# Patient Record
Sex: Female | Born: 1960 | Race: White | Hispanic: No | Smoking: Former smoker
Health system: Southern US, Community
[De-identification: ages and names within clinical notes are randomized; demographics above are authoritative.]

## PROBLEM LIST (undated history)

## (undated) DIAGNOSIS — C50919 Malignant neoplasm of unspecified site of unspecified female breast: Secondary | ICD-10-CM

## (undated) DIAGNOSIS — Z1501 Genetic susceptibility to malignant neoplasm of breast: Secondary | ICD-10-CM

## (undated) DIAGNOSIS — F32A Depression, unspecified: Secondary | ICD-10-CM

## (undated) DIAGNOSIS — Z9221 Personal history of antineoplastic chemotherapy: Secondary | ICD-10-CM

## (undated) DIAGNOSIS — C50511 Malignant neoplasm of lower-outer quadrant of right female breast: Secondary | ICD-10-CM

## (undated) DIAGNOSIS — I890 Lymphedema, not elsewhere classified: Secondary | ICD-10-CM

## (undated) DIAGNOSIS — Z1509 Genetic susceptibility to other malignant neoplasm: Secondary | ICD-10-CM

## (undated) DIAGNOSIS — F329 Major depressive disorder, single episode, unspecified: Secondary | ICD-10-CM

## (undated) DIAGNOSIS — R51 Headache: Secondary | ICD-10-CM

## (undated) DIAGNOSIS — Z5320 Procedure and treatment not carried out because of patient's decision for unspecified reasons: Secondary | ICD-10-CM

## (undated) DIAGNOSIS — Z923 Personal history of irradiation: Secondary | ICD-10-CM

## (undated) DIAGNOSIS — R519 Headache, unspecified: Secondary | ICD-10-CM

## (undated) HISTORY — DX: Genetic susceptibility to other malignant neoplasm: Z15.09

## (undated) HISTORY — DX: Malignant neoplasm of lower-outer quadrant of right female breast: C50.511

## (undated) HISTORY — DX: Genetic susceptibility to malignant neoplasm of breast: Z15.01

## (undated) HISTORY — DX: Procedure and treatment not carried out because of patient's decision for unspecified reasons: Z53.20

---

## 1987-06-09 HISTORY — PX: ECTOPIC PREGNANCY SURGERY: SHX613

## 1992-06-08 DIAGNOSIS — Z923 Personal history of irradiation: Secondary | ICD-10-CM

## 1992-06-08 DIAGNOSIS — C50919 Malignant neoplasm of unspecified site of unspecified female breast: Secondary | ICD-10-CM

## 1992-06-08 DIAGNOSIS — Z9221 Personal history of antineoplastic chemotherapy: Secondary | ICD-10-CM

## 1992-06-08 HISTORY — DX: Malignant neoplasm of unspecified site of unspecified female breast: C50.919

## 1992-06-08 HISTORY — DX: Personal history of antineoplastic chemotherapy: Z92.21

## 1992-06-08 HISTORY — PX: BREAST LUMPECTOMY: SHX2

## 1992-06-08 HISTORY — DX: Personal history of irradiation: Z92.3

## 2015-03-05 ENCOUNTER — Other Ambulatory Visit: Payer: Self-pay | Admitting: Family Medicine

## 2015-03-05 ENCOUNTER — Other Ambulatory Visit: Payer: Self-pay

## 2015-03-06 LAB — CMP12+LP+TP+TSH+6AC+CBC/D/PLT
ALBUMIN: 4.6 g/dL (ref 3.5–5.5)
ALK PHOS: 100 IU/L (ref 39–117)
ALT: 29 IU/L (ref 0–32)
AST: 28 IU/L (ref 0–40)
Albumin/Globulin Ratio: 1.8 (ref 1.1–2.5)
BASOS: 2 %
BILIRUBIN TOTAL: 0.5 mg/dL (ref 0.0–1.2)
BUN / CREAT RATIO: 19 (ref 9–23)
BUN: 13 mg/dL (ref 6–24)
Basophils Absolute: 0.1 10*3/uL (ref 0.0–0.2)
CALCIUM: 9.9 mg/dL (ref 8.7–10.2)
CHLORIDE: 102 mmol/L (ref 97–108)
CREATININE: 0.67 mg/dL (ref 0.57–1.00)
Chol/HDL Ratio: 2.7 ratio units (ref 0.0–4.4)
Cholesterol, Total: 207 mg/dL — ABNORMAL HIGH (ref 100–199)
EOS (ABSOLUTE): 0.4 10*3/uL (ref 0.0–0.4)
EOS: 9 %
Free Thyroxine Index: 2.5 (ref 1.2–4.9)
GFR calc Af Amer: 114 mL/min/{1.73_m2} (ref >59)
GFR, EST NON AFRICAN AMERICAN: 99 mL/min/{1.73_m2} (ref >59)
GGT: 25 IU/L (ref 0–60)
GLUCOSE: 88 mg/dL (ref 65–99)
Globulin, Total: 2.5 g/dL (ref 1.5–4.5)
HDL: 77 mg/dL (ref >39)
HEMATOCRIT: 43.4 % (ref 34.0–46.6)
HEMOGLOBIN: 14.6 g/dL (ref 11.1–15.9)
IMMATURE GRANULOCYTES: 0 %
IRON: 88 ug/dL (ref 27–159)
Immature Grans (Abs): 0 10*3/uL (ref 0.0–0.1)
LDH: 189 IU/L (ref 119–226)
LDL CALC: 116 mg/dL — AB (ref 0–99)
LYMPHS ABS: 1.4 10*3/uL (ref 0.7–3.1)
Lymphs: 34 %
MCH: 30.2 pg (ref 26.6–33.0)
MCHC: 33.6 g/dL (ref 31.5–35.7)
MCV: 90 fL (ref 79–97)
Monocytes Absolute: 0.3 10*3/uL (ref 0.1–0.9)
Monocytes: 9 %
NEUTROS ABS: 1.9 10*3/uL (ref 1.4–7.0)
Neutrophils: 46 %
POTASSIUM: 4.8 mmol/L (ref 3.5–5.2)
Phosphorus: 2.8 mg/dL (ref 2.5–4.5)
Platelets: 236 10*3/uL (ref 150–379)
RBC: 4.84 x10E6/uL (ref 3.77–5.28)
RDW: 13.4 % (ref 12.3–15.4)
SODIUM: 140 mmol/L (ref 134–144)
T3 Uptake Ratio: 30 % (ref 24–39)
T4, Total: 8.3 ug/dL (ref 4.5–12.0)
TOTAL PROTEIN: 7.1 g/dL (ref 6.0–8.5)
TSH: 2.05 u[IU]/mL (ref 0.450–4.500)
Triglycerides: 69 mg/dL (ref 0–149)
URIC ACID: 6 mg/dL (ref 2.5–7.1)
VLDL CHOLESTEROL CAL: 14 mg/dL (ref 5–40)
WBC: 4 10*3/uL (ref 3.4–10.8)

## 2015-03-06 LAB — HGB A1C W/O EAG: HEMOGLOBIN A1C: 6 % — AB (ref 4.8–5.6)

## 2015-06-24 DIAGNOSIS — G43009 Migraine without aura, not intractable, without status migrainosus: Secondary | ICD-10-CM | POA: Insufficient documentation

## 2015-06-24 DIAGNOSIS — Z853 Personal history of malignant neoplasm of breast: Secondary | ICD-10-CM | POA: Insufficient documentation

## 2015-06-25 ENCOUNTER — Other Ambulatory Visit: Payer: Self-pay | Admitting: Internal Medicine

## 2015-06-25 DIAGNOSIS — Z853 Personal history of malignant neoplasm of breast: Secondary | ICD-10-CM

## 2015-07-05 ENCOUNTER — Other Ambulatory Visit: Payer: Self-pay | Admitting: Internal Medicine

## 2015-07-05 DIAGNOSIS — Z1231 Encounter for screening mammogram for malignant neoplasm of breast: Secondary | ICD-10-CM

## 2015-07-10 ENCOUNTER — Other Ambulatory Visit: Payer: Self-pay | Admitting: *Deleted

## 2015-07-10 ENCOUNTER — Inpatient Hospital Stay
Admission: RE | Admit: 2015-07-10 | Discharge: 2015-07-10 | Disposition: A | Discharge status: Home / Self Care | Payer: Self-pay | Source: Ambulatory Visit | Attending: *Deleted | Admitting: *Deleted

## 2015-07-10 DIAGNOSIS — Z9289 Personal history of other medical treatment: Secondary | ICD-10-CM

## 2015-07-11 ENCOUNTER — Ambulatory Visit: Payer: Self-pay

## 2015-07-17 ENCOUNTER — Ambulatory Visit
Admission: RE | Admit: 2015-07-17 | Discharge: 2015-07-17 | Disposition: A | Discharge status: Home / Self Care | Payer: No Typology Code available for payment source | Source: Ambulatory Visit | Attending: Internal Medicine | Admitting: Internal Medicine

## 2015-07-17 DIAGNOSIS — Z1231 Encounter for screening mammogram for malignant neoplasm of breast: Secondary | ICD-10-CM | POA: Diagnosis present

## 2015-07-17 HISTORY — DX: Malignant neoplasm of unspecified site of unspecified female breast: C50.919

## 2015-07-22 ENCOUNTER — Other Ambulatory Visit: Payer: Self-pay | Admitting: Internal Medicine

## 2015-07-22 DIAGNOSIS — R928 Other abnormal and inconclusive findings on diagnostic imaging of breast: Secondary | ICD-10-CM

## 2015-07-24 ENCOUNTER — Ambulatory Visit
Admission: RE | Admit: 2015-07-24 | Discharge: 2015-07-24 | Disposition: A | Discharge status: Home / Self Care | Payer: No Typology Code available for payment source | Source: Ambulatory Visit | Attending: Internal Medicine | Admitting: Internal Medicine

## 2015-07-24 DIAGNOSIS — R928 Other abnormal and inconclusive findings on diagnostic imaging of breast: Secondary | ICD-10-CM

## 2015-07-24 DIAGNOSIS — N63 Unspecified lump in breast: Secondary | ICD-10-CM | POA: Insufficient documentation

## 2015-07-24 DIAGNOSIS — N6489 Other specified disorders of breast: Secondary | ICD-10-CM | POA: Diagnosis present

## 2015-07-25 ENCOUNTER — Other Ambulatory Visit: Payer: Self-pay | Admitting: Internal Medicine

## 2015-07-25 DIAGNOSIS — N63 Unspecified lump in unspecified breast: Secondary | ICD-10-CM

## 2015-08-06 ENCOUNTER — Ambulatory Visit
Admission: RE | Admit: 2015-08-06 | Discharge: 2015-08-06 | Disposition: A | Discharge status: Home / Self Care | Payer: No Typology Code available for payment source | Source: Ambulatory Visit | Attending: Internal Medicine | Admitting: Internal Medicine

## 2015-08-06 DIAGNOSIS — C50512 Malignant neoplasm of lower-outer quadrant of left female breast: Secondary | ICD-10-CM | POA: Insufficient documentation

## 2015-08-06 DIAGNOSIS — N63 Unspecified lump in unspecified breast: Secondary | ICD-10-CM

## 2015-08-09 ENCOUNTER — Ambulatory Visit: Payer: Self-pay | Admitting: General Surgery

## 2015-08-09 LAB — SURGICAL PATHOLOGY

## 2015-08-12 ENCOUNTER — Ambulatory Visit (INDEPENDENT_AMBULATORY_CARE_PROVIDER_SITE_OTHER): Payer: PRIVATE HEALTH INSURANCE | Admitting: General Surgery

## 2015-08-12 ENCOUNTER — Encounter: Payer: Self-pay | Admitting: General Surgery

## 2015-08-12 ENCOUNTER — Other Ambulatory Visit: Payer: Self-pay

## 2015-08-12 VITALS — BP 130/80 | HR 82 | Resp 12 | Ht 65.0 in | Wt 156.0 lb

## 2015-08-12 DIAGNOSIS — Z853 Personal history of malignant neoplasm of breast: Secondary | ICD-10-CM | POA: Diagnosis not present

## 2015-08-12 DIAGNOSIS — N6310 Unspecified lump in the right breast, unspecified quadrant: Secondary | ICD-10-CM

## 2015-08-12 DIAGNOSIS — N63 Unspecified lump in breast: Secondary | ICD-10-CM

## 2015-08-12 DIAGNOSIS — C50511 Malignant neoplasm of lower-outer quadrant of right female breast: Secondary | ICD-10-CM

## 2015-08-12 HISTORY — PX: BREAST BIOPSY: SHX20

## 2015-08-12 NOTE — Progress Notes (Signed)
Patient ID: Amy Holland, female   DOB: 1960/08/16, 55 y.o.   MRN: [id number]  Chief Complaint  Patient presents with  . Follow-up    mammogram, right breast biopsy    HPI Amy Holland is a 56 y.o. female who presents for a breast evaluation. The most recent mammogram was done on 08-06-15 with U/S and biopsy of the right breast.  Biopsy was positive. No fever,pain or discharge. The patient declined BRCA testing in the past. She has 3 children, 2 males in one female.  The patient had transient menopause after chemotherapy with her first cancer in 1994.Marland Kitchen Resume shortly thereafter and ended age 1. (2002). Patient does perform regular self breast checks and gets regular mammograms done.   I personally reviewed the patient's history.    HPI  Past Medical History  Diagnosis Date  . Breast cancer ([place]) 1994    left lumpectomy with lymph node removal c Radiation and chemo    Past Surgical History  Procedure Laterality Date  . Breast lumpectomy Left 1994    with lymph node removal  . Ectopic pregnancy surgery  1989    Family History  Problem Relation Age of Onset  . Breast cancer Sister 39    BRCA positive.  . Breast cancer Sister 80    BRCA negative  . Ovarian cancer Maternal Grandmother     Social History Social History  Substance Use Topics  . Smoking status: Never Smoker   . Smokeless tobacco: None  . Alcohol Use: 0.0 oz/week    0 Standard drinks or equivalent per week     Comment: 2 drinks per day    No Known Allergies  Current Outpatient Prescriptions  Medication Sig Dispense Refill  . Loratadine 10 MG CAPS Take by mouth.    . SUMAtriptan (IMITREX) 50 MG tablet Take 50 mg by mouth every 2 (two) hours as needed for migraine. May repeat in 2 hours if headache persists or recurs.     No current facility-administered medications for this visit.    Review of Systems Review of Systems  Constitutional: Negative.   Respiratory: Negative.   Cardiovascular: Negative.      Blood pressure 130/80, pulse 82, resp. rate 12, height _0  (1.651 m), weight 156 lb (70.761 kg).  Physical Exam Physical Exam  Constitutional: She is oriented to person, place, and time. She appears well-developed and well-nourished.  Eyes: Conjunctivae and EOM are normal. No scleral icterus.  Neck: Neck supple.  Cardiovascular: Normal rate, regular rhythm and normal heart sounds.   Pulmonary/Chest: Effort normal and breath sounds normal. Right breast exhibits no inverted nipple, no mass, no nipple discharge, no skin change and no tenderness. Left breast exhibits no inverted nipple, no skin change and no tenderness.    Left breast deformed  10 o'clock and dimpling at the incision site.   Lymphadenopathy:    She has no cervical adenopathy.  Neurological: She is alert and oriented to person, place, and time.  Skin: Skin is warm and dry.  Nursing note and vitals reviewed.   Data Reviewed Ultrasound guided core biopsy showed evidence of invasive mammary carcinoma no special type, 0.5 cm. Histologic grade 1.  ER 90%, PR 10-50%, HER-2/neu not overexpressed.  2017 mammograms and ultrasound reviewed.  Ultrasound examination of the right breast is completed to determine a preoperative needle localization will be required. At the 7:00 position, 6 cm from the nipple a taller rather than wide mass measuring 0.6 by's 1.0 x 1.06 cm is  identified corresponding to the previously defined lesion. BI-RADS-6.  Assessment    Stage I carcinoma of the right breast.    Plan    Options for management were reviewed. Changes since 1994 including sentinel node biopsy were discussed. Equivalency of breast conservation and mastectomy were reviewed. Possibility of mastectomy with immediate reconstruction discussed. Possibility of contour correction for the contralateral breast with plastic surgery was reviewed.  The patient reports that her considerations is the quickest ability to return to work. She  is supporting her self and the potential for rapid recovery is most important in her mind at this time.  Opportunity for plastic surgery referral reviewed.  Opportunity for second surgical opinion discussed (the patient's sisters were cared for at [facility] and were generally is massive of her going there) sees.  Informational brochure in websites provided.  The patient will contact the office if she has any questions, desires referral would like to schedule surgery.     Scribed by Sebastian Ache  PCP:  Cephas Darby, Forest Gleason 08/13/2015, 9:06 PM

## 2015-08-13 ENCOUNTER — Encounter: Payer: Self-pay | Admitting: General Surgery

## 2015-08-13 DIAGNOSIS — C50511 Malignant neoplasm of lower-outer quadrant of right female breast: Secondary | ICD-10-CM | POA: Insufficient documentation

## 2015-08-29 ENCOUNTER — Other Ambulatory Visit: Payer: Self-pay | Admitting: *Deleted

## 2015-08-29 ENCOUNTER — Telehealth: Payer: Self-pay | Admitting: *Deleted

## 2015-08-29 DIAGNOSIS — C50511 Malignant neoplasm of lower-outer quadrant of right female breast: Secondary | ICD-10-CM

## 2015-08-29 NOTE — Telephone Encounter (Signed)
Message for patient to call the office.   We need to review time for patient to be at the hospital day of surgery and review instructions. Patient will only require a pre-op visit if she has additional questions.   Paperwork will be mailed to the patient once we have spoken with her on the phone.

## 2015-08-29 NOTE — Telephone Encounter (Signed)
Patient called the office to report that she has decided to have a right breast lumpectomy.   This patient would like to get this scheduled for 09-24-15.

## 2015-08-30 ENCOUNTER — Encounter: Payer: Self-pay | Admitting: *Deleted

## 2015-08-30 NOTE — Progress Notes (Signed)
  Oncology Nurse Navigator Documentation  Navigator Location: [facility] (08/30/15 1500) Navigator Encounter Type: Introductory phone call (08/30/15 1500)   Abnormal Finding Date: 07/24/15 (08/30/15 1500) Confirmed Diagnosis Date: 09/03/15 (08/30/15 1500) Surgery Date: 09/24/15 (08/30/15 1500)                                  Time Spent with Patient: 15 (08/30/15 1500)   Left message for patient to return my call.

## 2015-09-02 NOTE — Telephone Encounter (Signed)
Patient called the office back and was notified of surgery times.   Instructions were reviewed by phone with the patient.   Paperwork will be mailed to the patient today.   She was instructed to call the office should she have further questions.   *Patient states Dr. Bary Castilla told her she would not be a candidate for Tamoxifen therapy. She would like to know what she would be a candidate for. Otherwise, patient has no further questions at this time.

## 2015-09-02 NOTE — Telephone Encounter (Signed)
The patient is a candidate for estrogen suppression, typically done with an aromatase inhibitor at her age and menopausal status.  Unlikely require mastectomy based on tumor size and location, would plan for wide excision, sentinel node and postoperative radiation.  Timing of radiation would depend on whether she is going to receive partial breast were whole breast radiation.  She'll call if she has any additional questions.

## 2015-09-10 ENCOUNTER — Encounter: Payer: Self-pay | Admitting: *Deleted

## 2015-09-10 NOTE — Progress Notes (Signed)
  Oncology Nurse Navigator Documentation  Navigator Location: [facility] (09/10/15 0900) Navigator Encounter Type: Introductory phone call (09/10/15 0900)                                          Time Spent with Patient: 15 (09/10/15 0900)   Left message with patient to return my call.  I would like to establish navigation services.

## 2015-09-11 ENCOUNTER — Other Ambulatory Visit: Payer: Self-pay | Admitting: General Surgery

## 2015-09-17 ENCOUNTER — Inpatient Hospital Stay
Admission: RE | Admit: 2015-09-17 | Discharge: 2015-09-17 | Disposition: A | Discharge status: Home / Self Care | Payer: Self-pay | Source: Ambulatory Visit

## 2015-09-17 ENCOUNTER — Encounter: Payer: Self-pay | Admitting: *Deleted

## 2015-09-17 NOTE — Patient Instructions (Signed)
  Your procedure is scheduled on:09/24/15 Report to Radiology.at 7:15 .  Remember: Instructions that are not followed completely may result in serious medical risk, up to and including death, or upon the discretion of your surgeon and anesthesiologist your surgery may need to be rescheduled.    _x___ 1. Do not eat food or drink liquids after midnight. No gum chewing or hard candies.     _x___ 2. No Alcohol for 24 hours before or after surgery.   ____ 3. Bring all medications with you on the day of surgery if instructed.    _x__ 4. Notify your doctor if there is any change in your medical condition     (cold, fever, infections).     Do not wear jewelry, make-up, hairpins, clips or nail polish.  Do not wear lotions, powders, or perfumes. You may wear deodorant.  Do not shave 48 hours prior to surgery. Men may shave face and neck.  Do not bring valuables to the hospital.    [facility] is not responsible for any belongings or valuables.               Contacts, dentures or bridgework may not be worn into surgery.  Leave your suitcase in the car. After surgery it may be brought to your room.  For patients admitted to the hospital, discharge time is determined by your                treatment team.   Patients discharged the day of surgery will not be allowed to drive home.   Please read over the following fact sheets that you were given:   Care and Recovery After Surgery discussed  __x__ Take these medicines the morning of surgery with A SIP OF WATER:    1. Loratidine  2. flonase  3.   4.  5.  6.  ____ Fleet Enema (as directed)   _x___ Use CHG Soap as directed if able to come to get it.  ____ Use inhalers on the day of surgery  ____ Stop metformin 2 days prior to surgery    ____ Take 1/2 of usual insulin dose the night before surgery and none on the morning of surgery.   ____ Stop Coumadin/Plavix/aspirin on   __x__ Stop Anti-inflammatories on may take tylenol only   ____  Stop supplements until after surgery.  May continue multivitamin  ____ Bring C-Pap to the hospital.

## 2015-09-17 NOTE — Pre-Procedure Instructions (Signed)
Office notified of need for updated H&P. Out of date.

## 2015-09-23 NOTE — Pre-Procedure Instructions (Signed)
Dr. Bary Castilla gave permission to use the left arm for IV stick.

## 2015-09-24 ENCOUNTER — Encounter: Payer: Self-pay | Admitting: *Deleted

## 2015-09-24 ENCOUNTER — Ambulatory Visit: Payer: No Typology Code available for payment source | Admitting: Anesthesiology

## 2015-09-24 ENCOUNTER — Ambulatory Visit
Admission: RE | Admit: 2015-09-24 | Discharge: 2015-09-24 | Disposition: A | Discharge status: Home / Self Care | Payer: No Typology Code available for payment source | Source: Ambulatory Visit | Attending: General Surgery | Admitting: General Surgery

## 2015-09-24 ENCOUNTER — Encounter
Admission: RE | Disposition: A | Discharge status: Home / Self Care | Payer: Self-pay | Source: Ambulatory Visit | Attending: General Surgery

## 2015-09-24 DIAGNOSIS — F329 Major depressive disorder, single episode, unspecified: Secondary | ICD-10-CM | POA: Diagnosis not present

## 2015-09-24 DIAGNOSIS — C50511 Malignant neoplasm of lower-outer quadrant of right female breast: Secondary | ICD-10-CM

## 2015-09-24 DIAGNOSIS — Z9889 Other specified postprocedural states: Secondary | ICD-10-CM | POA: Diagnosis not present

## 2015-09-24 DIAGNOSIS — Z87891 Personal history of nicotine dependence: Secondary | ICD-10-CM | POA: Insufficient documentation

## 2015-09-24 DIAGNOSIS — Z853 Personal history of malignant neoplasm of breast: Secondary | ICD-10-CM | POA: Diagnosis not present

## 2015-09-24 DIAGNOSIS — Z79899 Other long term (current) drug therapy: Secondary | ICD-10-CM | POA: Diagnosis not present

## 2015-09-24 DIAGNOSIS — Z91040 Latex allergy status: Secondary | ICD-10-CM | POA: Diagnosis not present

## 2015-09-24 DIAGNOSIS — Z7951 Long term (current) use of inhaled steroids: Secondary | ICD-10-CM | POA: Insufficient documentation

## 2015-09-24 DIAGNOSIS — C50911 Malignant neoplasm of unspecified site of right female breast: Secondary | ICD-10-CM

## 2015-09-24 HISTORY — DX: Headache: R51

## 2015-09-24 HISTORY — DX: Malignant neoplasm of lower-outer quadrant of right female breast: C50.511

## 2015-09-24 HISTORY — PX: BREAST LUMPECTOMY: SHX2

## 2015-09-24 HISTORY — DX: Depression, unspecified: F32.A

## 2015-09-24 HISTORY — PX: PARTIAL MASTECTOMY WITH AXILLARY SENTINEL LYMPH NODE BIOPSY: SHX6004

## 2015-09-24 HISTORY — DX: Headache, unspecified: R51.9

## 2015-09-24 HISTORY — DX: Major depressive disorder, single episode, unspecified: F32.9

## 2015-09-24 SURGERY — PARTIAL MASTECTOMY WITH AXILLARY SENTINEL LYMPH NODE BIOPSY
Anesthesia: General | Laterality: Right | Wound class: Clean

## 2015-09-24 MED ORDER — HYDROCODONE-ACETAMINOPHEN 5-325 MG PO TABS: 1.0000 | ORAL_TABLET | ORAL | Status: DC | PRN

## 2015-09-24 MED ORDER — MIDAZOLAM HCL 2 MG/2ML IJ SOLN
INTRAMUSCULAR | Status: DC | PRN
  Administered 2015-09-24: 2 mg via INTRAVENOUS

## 2015-09-24 MED ORDER — FAMOTIDINE 20 MG PO TABS
ORAL_TABLET | ORAL | Status: AC
  Filled 2015-09-24: qty 1

## 2015-09-24 MED ORDER — KETOROLAC TROMETHAMINE 30 MG/ML IJ SOLN
INTRAMUSCULAR | Status: DC | PRN
  Administered 2015-09-24: 30 mg via INTRAVENOUS

## 2015-09-24 MED ORDER — ONDANSETRON HCL 4 MG/2ML IJ SOLN
INTRAMUSCULAR | Status: DC | PRN
  Administered 2015-09-24: 4 mg via INTRAVENOUS

## 2015-09-24 MED ORDER — METHYLENE BLUE 0.5 % INJ SOLN
INTRAVENOUS | Status: AC
  Filled 2015-09-24: qty 10

## 2015-09-24 MED ORDER — FENTANYL CITRATE (PF) 100 MCG/2ML IJ SOLN
25.0000 ug | INTRAMUSCULAR | Status: DC | PRN
  Administered 2015-09-24 (×2): 25 ug via INTRAVENOUS

## 2015-09-24 MED ORDER — TECHNETIUM TC 99M SULFUR COLLOID
1.0100 | Freq: Once | INTRAVENOUS | Status: AC | PRN
  Administered 2015-09-24: 1.01 via INTRAVENOUS

## 2015-09-24 MED ORDER — BUPIVACAINE-EPINEPHRINE (PF) 0.5% -1:200000 IJ SOLN
INTRAMUSCULAR | Status: AC
  Filled 2015-09-24: qty 30

## 2015-09-24 MED ORDER — FENTANYL CITRATE (PF) 100 MCG/2ML IJ SOLN
INTRAMUSCULAR | Status: AC
  Administered 2015-09-24: 25 ug via INTRAVENOUS
  Filled 2015-09-24: qty 2

## 2015-09-24 MED ORDER — FAMOTIDINE 20 MG PO TABS
20.0000 mg | ORAL_TABLET | Freq: Once | ORAL | Status: AC
  Administered 2015-09-24: 20 mg via ORAL

## 2015-09-24 MED ORDER — BUPIVACAINE-EPINEPHRINE (PF) 0.5% -1:200000 IJ SOLN
INTRAMUSCULAR | Status: DC | PRN
  Administered 2015-09-24: 30 mL

## 2015-09-24 MED ORDER — ACETAMINOPHEN 10 MG/ML IV SOLN
INTRAVENOUS | Status: DC | PRN
  Administered 2015-09-24: 1000 mg via INTRAVENOUS

## 2015-09-24 MED ORDER — ONDANSETRON HCL 4 MG/2ML IJ SOLN: 4.0000 mg | Freq: Once | INTRAMUSCULAR | Status: DC | PRN

## 2015-09-24 MED ORDER — DEXAMETHASONE SODIUM PHOSPHATE 10 MG/ML IJ SOLN
INTRAMUSCULAR | Status: DC | PRN
  Administered 2015-09-24: 10 mg via INTRAVENOUS

## 2015-09-24 MED ORDER — FENTANYL CITRATE (PF) 100 MCG/2ML IJ SOLN
INTRAMUSCULAR | Status: DC | PRN
  Administered 2015-09-24 (×2): 50 ug via INTRAVENOUS

## 2015-09-24 MED ORDER — ACETAMINOPHEN 10 MG/ML IV SOLN
INTRAVENOUS | Status: AC
  Filled 2015-09-24: qty 100

## 2015-09-24 MED ORDER — PROPOFOL 10 MG/ML IV BOLUS
INTRAVENOUS | Status: DC | PRN
Start: 2015-09-24 — End: 2015-09-24
  Administered 2015-09-24: 200 mg via INTRAVENOUS

## 2015-09-24 MED ORDER — METHYLENE BLUE 0.5 % INJ SOLN
INTRAVENOUS | Status: DC | PRN
  Administered 2015-09-24: 6 mL

## 2015-09-24 MED ORDER — LACTATED RINGERS IV SOLN
INTRAVENOUS | Status: DC
  Administered 2015-09-24: 09:00:00 via INTRAVENOUS

## 2015-09-24 MED ORDER — SODIUM CHLORIDE 0.9 % IJ SOLN
INTRAMUSCULAR | Status: AC
  Filled 2015-09-24: qty 10

## 2015-09-24 SURGICAL SUPPLY — 54 items
APPLIER CLIP 11 MED OPEN (CLIP)
APPLIER CLIP 13 LRG OPEN (CLIP)
BANDAGE ELASTIC 6 LF NS (GAUZE/BANDAGES/DRESSINGS) ×3 IMPLANT
BLADE SURG 15 STRL SS SAFETY (BLADE) ×3 IMPLANT
BNDG GAUZE 4.5X4.1 6PLY STRL (MISCELLANEOUS) ×3 IMPLANT
BULB RESERV EVAC DRAIN JP 100C (MISCELLANEOUS) IMPLANT
CANISTER SUCT 1200ML W/VALVE (MISCELLANEOUS) ×3 IMPLANT
CHLORAPREP W/TINT 26ML (MISCELLANEOUS) ×3 IMPLANT
CLIP APPLIE 11 MED OPEN (CLIP) IMPLANT
CLIP APPLIE 13 LRG OPEN (CLIP) IMPLANT
CLOSURE WOUND 1/2 X4 (GAUZE/BANDAGES/DRESSINGS) ×1
CNTNR SPEC 2.5X3XGRAD LEK (MISCELLANEOUS)
CONT SPEC 4OZ STER OR WHT (MISCELLANEOUS)
CONTAINER SPEC 2.5X3XGRAD LEK (MISCELLANEOUS) IMPLANT
DEVICE DUBIN SPECIMEN MAMMOGRA (MISCELLANEOUS) ×3 IMPLANT
DRAIN CHANNEL JP 15F RND 16 (MISCELLANEOUS) IMPLANT
DRAPE LAPAROTOMY TRNSV 106X77 (MISCELLANEOUS) ×3 IMPLANT
DRSG TELFA 3X8 NADH (GAUZE/BANDAGES/DRESSINGS) ×3 IMPLANT
ELECT CAUTERY BLADE TIP 2.5 (TIP) ×3
ELECT REM PT RETURN 9FT ADLT (ELECTROSURGICAL) ×3
ELECTRODE CAUTERY BLDE TIP 2.5 (TIP) ×1 IMPLANT
ELECTRODE REM PT RTRN 9FT ADLT (ELECTROSURGICAL) ×1 IMPLANT
GAUZE FLUFF 18X24 1PLY STRL (GAUZE/BANDAGES/DRESSINGS) ×3 IMPLANT
GAUZE SPONGE 4X4 12PLY STRL (GAUZE/BANDAGES/DRESSINGS) IMPLANT
GLOVE BIO SURGEON STRL SZ7.5 (GLOVE) IMPLANT
GLOVE INDICATOR 8.0 STRL GRN (GLOVE) IMPLANT
GLOVE SKINSENSE NS SZ7.0 (GLOVE) ×8
GLOVE SKINSENSE STRL SZ7.0 (GLOVE) ×4 IMPLANT
GOWN STRL REUS W/ TWL LRG LVL3 (GOWN DISPOSABLE) ×2 IMPLANT
GOWN STRL REUS W/TWL LRG LVL3 (GOWN DISPOSABLE) ×4
LABEL OR SOLS (LABEL) ×3 IMPLANT
NEEDLE HYPO 25X1 1.5 SAFETY (NEEDLE) ×3 IMPLANT
PACK BASIN MINOR ARMC (MISCELLANEOUS) ×3 IMPLANT
PIN SAFETY STRL (MISCELLANEOUS) IMPLANT
SHEARS FOC LG CVD HARMONIC 17C (MISCELLANEOUS) IMPLANT
SLEVE PROBE SENORX GAMMA FIND (MISCELLANEOUS) ×3 IMPLANT
SPONGE LAP 18X18 5 PK (GAUZE/BANDAGES/DRESSINGS) ×3 IMPLANT
STRIP CLOSURE SKIN 1/2X4 (GAUZE/BANDAGES/DRESSINGS) ×2 IMPLANT
SUT ETHILON 3-0 FS-10 30 BLK (SUTURE) ×3
SUT SILK 0 (SUTURE)
SUT SILK 0 30XBRD TIE 6 (SUTURE) IMPLANT
SUT SILK 3 0 (SUTURE)
SUT SILK 3-0 18XBRD TIE 12 (SUTURE) IMPLANT
SUT VIC AB 2-0 CT1 27 (SUTURE) ×4
SUT VIC AB 2-0 CT1 TAPERPNT 27 (SUTURE) ×2 IMPLANT
SUT VIC AB 2-0 CT2 27 (SUTURE) IMPLANT
SUT VIC AB 3-0 SH 27 (SUTURE) ×2
SUT VIC AB 3-0 SH 27X BRD (SUTURE) ×1 IMPLANT
SUT VIC AB 4-0 FS2 27 (SUTURE) ×3 IMPLANT
SUT VICRYL+ 3-0 144IN (SUTURE) ×3 IMPLANT
SUTURE EHLN 3-0 FS-10 30 BLK (SUTURE) ×1 IMPLANT
SWABSTK COMLB BENZOIN TINCTURE (MISCELLANEOUS) ×3 IMPLANT
SYR CONTROL 10ML (SYRINGE) ×3 IMPLANT
TAPE TRANSPORE STRL 2 31045 (GAUZE/BANDAGES/DRESSINGS) IMPLANT

## 2015-09-24 NOTE — Discharge Instructions (Signed)

## 2015-09-24 NOTE — Transfer of Care (Signed)
Immediate Anesthesia Transfer of Care Note  Patient: Amy Holland  Procedure(s) Performed: Procedure(s): PARTIAL MASTECTOMY WITH AXILLARY SENTINEL LYMPH NODE BIOPSY, WIDE EXCISION, RECONSTRUCTION/MASTOPLASTY (Right)  Patient Location: PACU  Anesthesia Type:General  Level of Consciousness: awake, alert  and oriented  Airway & Oxygen Therapy: Patient Spontanous Breathing and Patient connected to face mask oxygen  Post-op Assessment: Report given to RN and Post -op Vital signs reviewed and stable  Post vital signs: Reviewed and stable  Last Vitals:  Filed Vitals:   09/24/15 0833 09/24/15 1039  BP: 136/71 134/81  Pulse: 71 89  Temp:  36.2 C  Resp: 16 14    Complications: No apparent anesthesia complications

## 2015-09-24 NOTE — Anesthesia Procedure Notes (Signed)
Procedure Name: LMA Insertion Date/Time: 09/24/2015 9:30 AM Performed by: Jonna Clark Pre-anesthesia Checklist: Patient identified, Patient being monitored, Timeout performed, Emergency Drugs available and Suction available Patient Re-evaluated:Patient Re-evaluated prior to inductionOxygen Delivery Method: Circle system utilized Preoxygenation: Pre-oxygenation with 100% oxygen Intubation Type: IV induction Ventilation: Mask ventilation without difficulty LMA: LMA inserted LMA Size: 3.5 Tube type: Oral Number of attempts: 1 Placement Confirmation: positive ETCO2 and breath sounds checked- equal and bilateral Tube secured with: Tape Dental Injury: Teeth and Oropharynx as per pre-operative assessment

## 2015-09-24 NOTE — Anesthesia Preprocedure Evaluation (Signed)
Anesthesia Evaluation  Patient identified by MRN, date of birth, ID band Patient awake    Reviewed: Allergy & Precautions, H&P , NPO status , Patient's Chart, lab work & pertinent test results, reviewed documented beta blocker date and time   History of Anesthesia Complications Negative for: history of anesthetic complications  Airway Mallampati: II  TM Distance: >3 FB Neck ROM: full    Dental no notable dental hx. (+) Caps, Implants, Chipped   Pulmonary neg pulmonary ROS, former smoker,    Pulmonary exam normal breath sounds clear to auscultation       Cardiovascular Exercise Tolerance: Good negative cardio ROS Normal cardiovascular exam Rhythm:regular Rate:Normal     Neuro/Psych  Headaches, neg Seizures PSYCHIATRIC DISORDERS (Depression)    GI/Hepatic negative GI ROS, Neg liver ROS,   Endo/Other  negative endocrine ROS  Renal/GU negative Renal ROS  negative genitourinary   Musculoskeletal   Abdominal   Peds  Hematology negative hematology ROS (+)   Anesthesia Other Findings Past Medical History:   Breast cancer ([place])                             1994           Comment:left lumpectomy with lymph node removal c               Radiation and chemo   Depression                                                   Headache                                                       Comment:migraine   Reproductive/Obstetrics negative OB ROS                             Anesthesia Physical Anesthesia Plan  ASA: II  Anesthesia Plan: General   Post-op Pain Management:    Induction:   Airway Management Planned:   Additional Equipment:   Intra-op Plan:   Post-operative Plan:   Informed Consent: I have reviewed the patients History and Physical, chart, labs and discussed the procedure including the risks, benefits and alternatives for the proposed anesthesia with the patient or authorized  representative who has indicated his/her understanding and acceptance.   Dental Advisory Given  Plan Discussed with: Anesthesiologist, CRNA and Surgeon  Anesthesia Plan Comments:         Anesthesia Quick Evaluation

## 2015-09-24 NOTE — H&P (Signed)
Cynara Bibbins UX:[id number] April 12, 1961     HPI: No change in clinical history.  Prescriptions prior to admission  Medication Sig Dispense Refill Last Dose  . fluticasone (FLONASE) 50 MCG/ACT nasal spray Place 1 spray into both nostrils daily.     . Multiple Vitamin (MULTIVITAMIN) tablet Take 1 tablet by mouth daily.     . Loratadine 10 MG CAPS Take by mouth.     . SUMAtriptan (IMITREX) 50 MG tablet Take 50 mg by mouth every 2 (two) hours as needed for migraine. May repeat in 2 hours if headache persists or recurs.   Taking   Allergies  Allergen Reactions  . Latex Rash   Past Medical History  Diagnosis Date  . Breast cancer ([place]) 1994    left lumpectomy with lymph node removal c Radiation and chemo  . Depression   . Headache     migraine   Past Surgical History  Procedure Laterality Date  . Breast lumpectomy Left 1994    with lymph node removal  . Ectopic pregnancy surgery  1989   Social History   Social History  . Marital Status: Divorced    Spouse Name: N/A  . Number of Children: N/A  . Years of Education: N/A   Occupational History  . Not on file.   Social History Main Topics  . Smoking status: Former Smoker    Quit date: 06/19/1983  . Smokeless tobacco: Never Used  . Alcohol Use: 0.0 oz/week    0 Standard drinks or equivalent per week     Comment: 2 drinks per day  . Drug Use: No  . Sexual Activity: No   Other Topics Concern  . Not on file   Social History Narrative   Social History   Social History Narrative     ROS: Negative.     PE: HEENT: Negative. Lungs: Clear. Cardio: RR.  Data:  Previously completed right breast ultrasound showed a mass at the 7:00 position, 6 and initially the nipple. Previous biopsy showed invasive mammary carcinoma.    Assessment Stage I carcinoma the breast.  Plan:    Proceed with planned Right breast wide excision and sentinel node biopsy.  Robert Bellow 09/24/2015

## 2015-09-24 NOTE — Anesthesia Postprocedure Evaluation (Signed)
Anesthesia Post Note  Patient: Amy Holland  Procedure(s) Performed: Procedure(s) (LRB): PARTIAL MASTECTOMY WITH AXILLARY SENTINEL LYMPH NODE BIOPSY, WIDE EXCISION, RECONSTRUCTION/MASTOPLASTY (Right)  Patient location during evaluation: PACU Anesthesia Type: General Level of consciousness: awake and alert Pain management: pain level controlled Vital Signs Assessment: post-procedure vital signs reviewed and stable Respiratory status: spontaneous breathing, nonlabored ventilation, respiratory function stable and patient connected to nasal cannula oxygen Cardiovascular status: blood pressure returned to baseline and stable Postop Assessment: no signs of nausea or vomiting Anesthetic complications: no    Last Vitals:  Filed Vitals:   09/24/15 1143 09/24/15 1209  BP: 134/84 124/84  Pulse: 75   Temp: 36.4 C   Resp: 16 104    Last Pain:  Filed Vitals:   09/24/15 1223  PainSc: 0-No pain                 Martha Clan

## 2015-09-24 NOTE — Op Note (Signed)
Preoperative diagnosis: Right breast cancer.  Postoperative diagnosis: Same.  Operative procedure: Wide local excision, mastoplasty, sentinel lymph node biopsy. Of the right breast.  Operating surgeon: Ollen Bowl, M.D.  Anesthesia: Gen. by LMA, Marcaine 0.5% with 1-200,000 of epinephrine, 30 mL local infiltration.  Estimated blood loss: Less than 5 mL.  Clinical note: This 55 year old woman was recently identified with a small but cancer in the 7:00 position of the left breast. She desired breast conservation. She was injected with technetium sulfur colloid prior to the procedure.  Operative note: With the patient under adequate general anesthesia a total of 4 mL of methylene blue diluted with saline 1-2 was injected in subareolar plexus. The breast, chest and neck: was then prepped with ChloraPrep and draped. Ultrasound was used to identify the lesion in the lower outer quadrant of left breast. It was fairly close to the skin and for that reason. Skin was taken. The incision ran from just below the edge of the areola to just above the inframammary fold. The skin was incised sharply and the remaining dissection completed with electrocautery. The specimen was orientated and specimen radiograph confirmed the previously placed clip in the center of the biopsy specimen. Pathology reported clear margins. While pathology was pending attention was turned to the axilla. The gamma finder was used to identify the area of increased uptake at the base the right axilla. Local anesthetic was infiltrated as had been done prior to the primary breast incision for postoperative analgesia. A small transverse incision was made and carried aphthous consulting this tissue with hemostasis achieved by electrocautery as well as 3-0 Vicryl ties. 25 mm hot, blue lymph nodes were identified next to each other. These were removed and sent in formalin for routine histology. Scanning through the remainder of the axilla showed  no additional areas of increased radioactive uptake.  The axilla was closed in layers with interrupted 2-0 Vicryl figure-of-eight sutures. The skin was closed with a running 4-0 Vicryl septic suture.  The breast wide excision site was closed after mobilizing the breast off the underlying pectoralis fascia. As was done circumferentially for approximately 5 cm and then approximated with interrupted 2-0 Vicryl figure-of-eight sutures. Multiple layers were used to approximate the superficial breast tissue. The skin was elevated as 5 mm flap circumferentially to allow smooth apposition with a running 4-0 Vicryl septic suture. Benzoin, Steri-Strips, Telfa, fluffed gauze, Kerlix and an Ace wrap was applied.  The patient tolerated the procedure well and was taken to recovery in stable condition.

## 2015-09-26 ENCOUNTER — Telehealth: Payer: Self-pay | Admitting: *Deleted

## 2015-09-26 ENCOUNTER — Telehealth: Payer: Self-pay | Admitting: General Surgery

## 2015-09-26 LAB — SURGICAL PATHOLOGY

## 2015-09-26 NOTE — Telephone Encounter (Signed)
-----   Message from Robert Bellow, MD sent at 09/26/2015  2:42 PM EDT ----- Please request lab to send for Mammoprint/ Blueprint testing. Thanks.   ----- Message -----    From: Lab in [facility] Interface    Sent: 09/26/2015  10:28 AM      To: Robert Bellow, MD

## 2015-09-26 NOTE — Telephone Encounter (Signed)
[facility] pathology aware of testing.

## 2015-09-26 NOTE — Telephone Encounter (Signed)
x

## 2015-09-27 ENCOUNTER — Ambulatory Visit (INDEPENDENT_AMBULATORY_CARE_PROVIDER_SITE_OTHER): Payer: PRIVATE HEALTH INSURANCE | Admitting: *Deleted

## 2015-09-27 DIAGNOSIS — C50511 Malignant neoplasm of lower-outer quadrant of right female breast: Secondary | ICD-10-CM

## 2015-09-27 NOTE — Progress Notes (Addendum)
Patient came in today for a wound check right breast lumpectomy .  The wound is clean, with no signs of infection noted. Follow up as scheduled.   The patient was informed of the pathology results, T1c, N0 tumor.  Minimal volume loss. Importance of wearing her bra day and night emphasized.  We will send the samples for Mammoprint testing prior to referral to medical oncology.  Ollen Bowl, M.D.

## 2015-09-30 ENCOUNTER — Ambulatory Visit: Payer: PRIVATE HEALTH INSURANCE | Admitting: General Surgery

## 2015-10-08 ENCOUNTER — Encounter: Payer: Self-pay | Admitting: General Surgery

## 2015-10-08 ENCOUNTER — Other Ambulatory Visit (INDEPENDENT_AMBULATORY_CARE_PROVIDER_SITE_OTHER): Payer: PRIVATE HEALTH INSURANCE

## 2015-10-08 ENCOUNTER — Ambulatory Visit: Payer: PRIVATE HEALTH INSURANCE | Admitting: General Surgery

## 2015-10-08 VITALS — BP 128/76 | HR 88 | Resp 12 | Ht 64.0 in | Wt 155.0 lb

## 2015-10-08 DIAGNOSIS — C50511 Malignant neoplasm of lower-outer quadrant of right female breast: Secondary | ICD-10-CM

## 2015-10-08 NOTE — Progress Notes (Signed)
Patient ID: Amy Holland, female   DOB: 1961-05-18, 55 y.o.   MRN: [id number]  Chief Complaint  Patient presents with  . Follow-up    Right Breast lumpectomy    HPI Amy Holland is a 55 y.o. female.  Here today for her postoperative visit, right breast partial mastectomy on 09-24-15. She states she is doing well. Minimal to no pain. She is still wearing her bra for support.  The patient study progress and increasing discomfort at the surgical site.  Mammoprint testing is still pending.  I personally reviewed the patient's history.   HPI  Past Medical History  Diagnosis Date  . Depression   . Headache     migraine  . Breast cancer ([place]) 1994    left lumpectomy with lymph node removal c Radiation and chemo  . Breast cancer of lower-outer quadrant of right female breast ([place]) 09/24/2015    Right breast, 7 o'clock:11 mm, histologic grade 2, T1c, N0; ER/PR positive, HER-2/neu not overexpressed. Negative margins    Past Surgical History  Procedure Laterality Date  . Ectopic pregnancy surgery  1989  . Partial mastectomy with axillary sentinel lymph node biopsy Right 09/24/2015    Procedure: PARTIAL MASTECTOMY WITH AXILLARY SENTINEL LYMPH NODE BIOPSY, WIDE EXCISION, RECONSTRUCTION/MASTOPLASTY;  Surgeon: Robert Bellow, MD;  Location: [facility] ORS;  Service: General;  Laterality: Right;  . Breast lumpectomy Left 1994    with lymph node removal Dr Amalia Hailey in [place]     Family History  Problem Relation Age of Onset  . Breast cancer Sister 7    BRCA positive.  . Breast cancer Sister 75    BRCA negative  . Ovarian cancer Mother   . Breast cancer Sister     BRCA positive    Social History Social History  Substance Use Topics  . Smoking status: Former Smoker    Quit date: 06/19/1983  . Smokeless tobacco: Never Used  . Alcohol Use: 0.0 oz/week    0 Standard drinks or equivalent per week     Comment: 2 drinks per day    Allergies  Allergen Reactions  . Latex Rash     Current Outpatient Prescriptions  Medication Sig Dispense Refill  . fluticasone (FLONASE) 50 MCG/ACT nasal spray Place 1 spray into both nostrils daily.    . Loratadine 10 MG CAPS Take by mouth.    . Multiple Vitamin (MULTIVITAMIN) tablet Take 1 tablet by mouth daily.    . SUMAtriptan (IMITREX) 50 MG tablet Take 50 mg by mouth every 2 (two) hours as needed for migraine. May repeat in 2 hours if headache persists or recurs.     No current facility-administered medications for this visit.    Review of Systems Review of Systems  Constitutional: Negative.   Respiratory: Negative.   Cardiovascular: Negative.     Blood pressure 128/76, pulse 88, resp. rate 12, height _0  (1.626 m), weight 155 lb (70.308 kg).  Physical Exam Physical Exam  Constitutional: She is oriented to person, place, and time. She appears well-developed and well-nourished.  Pulmonary/Chest:    Incision well healed right breast with minimal bruising.  Neurological: She is alert and oriented to person, place, and time.  Skin: Skin is warm and dry.  Psychiatric: Her behavior is normal.    Data Reviewed 11 mm, histologic grade 2, T1c, N0; ER/PR positive, HER-2/neu not overexpressed. Negative margins.  Ultrasound examination of the right breast at the wide excision site was completed to determine if she was a candidate  for accelerated partial breast radiation. The small cavity is within 6 mm of the skin. She is not a candidate for accelerated partial breast radiation.    Assessment    Doing well status post wide excision, mastoplasty and sentinel node biopsy. Stage I cancer.      Plan    The patient is anxious to move ahead with radiation assessment. Medical oncology assessment pending Mammoprint results.    Awaiting mammoprint results. Appointment with Radiation Oncologist.  Discussed genetic testing.  PCP:  Frazier Richards This information has been scribed by Karie Fetch RN,  BSN,BC.   Robert Bellow 10/09/2015, 2:22 PM

## 2015-10-08 NOTE — Patient Instructions (Addendum)
Appointment with Radiation Oncologist   The patient is aware to call back for any questions or concerns.

## 2015-10-09 ENCOUNTER — Encounter: Payer: Self-pay | Admitting: General Surgery

## 2015-10-15 ENCOUNTER — Encounter: Payer: Self-pay | Admitting: *Deleted

## 2015-10-15 ENCOUNTER — Encounter: Payer: Self-pay | Admitting: General Surgery

## 2015-10-15 ENCOUNTER — Telehealth: Payer: Self-pay | Admitting: *Deleted

## 2015-10-15 NOTE — Telephone Encounter (Signed)
-----   Message from Robert Bellow, MD sent at 10/15/2015  3:22 PM EDT ----- Please notify the patient that the Mammoprint result showed her to be at low risk for recurrent disease. We'll review in detail at follow-up. If she would like to have a formal consultation with medical oncology added be glad to arrange for that.  ----- Message -----    From: [place]    Sent: 10/15/2015   1:59 PM      To: Robert Bellow, MD

## 2015-10-15 NOTE — Telephone Encounter (Signed)
Notified patient as instructed, patient pleased. No appt with medical oncology at this time. Discussed follow-up appointments, patient agrees

## 2015-10-16 ENCOUNTER — Encounter: Payer: Self-pay | Admitting: Radiation Oncology

## 2015-10-16 ENCOUNTER — Ambulatory Visit
Admission: RE | Admit: 2015-10-16 | Discharge: 2015-10-16 | Disposition: A | Discharge status: Home / Self Care | Payer: No Typology Code available for payment source | Source: Ambulatory Visit | Attending: Radiation Oncology | Admitting: Radiation Oncology

## 2015-10-16 VITALS — BP 142/76 | HR 71 | Temp 98.0°F | Resp 20 | Wt 158.0 lb

## 2015-10-16 DIAGNOSIS — Z87891 Personal history of nicotine dependence: Secondary | ICD-10-CM | POA: Insufficient documentation

## 2015-10-16 DIAGNOSIS — F329 Major depressive disorder, single episode, unspecified: Secondary | ICD-10-CM | POA: Insufficient documentation

## 2015-10-16 DIAGNOSIS — Z79899 Other long term (current) drug therapy: Secondary | ICD-10-CM | POA: Insufficient documentation

## 2015-10-16 DIAGNOSIS — Z803 Family history of malignant neoplasm of breast: Secondary | ICD-10-CM | POA: Insufficient documentation

## 2015-10-16 DIAGNOSIS — Z510 Encounter for antineoplastic radiation therapy: Secondary | ICD-10-CM | POA: Insufficient documentation

## 2015-10-16 DIAGNOSIS — Z170 Estrogen receptor positive status [ER+]: Secondary | ICD-10-CM | POA: Insufficient documentation

## 2015-10-16 DIAGNOSIS — C50511 Malignant neoplasm of lower-outer quadrant of right female breast: Secondary | ICD-10-CM

## 2015-10-16 DIAGNOSIS — Z9013 Acquired absence of bilateral breasts and nipples: Secondary | ICD-10-CM | POA: Insufficient documentation

## 2015-10-16 DIAGNOSIS — Z853 Personal history of malignant neoplasm of breast: Secondary | ICD-10-CM | POA: Insufficient documentation

## 2015-10-16 DIAGNOSIS — Z8041 Family history of malignant neoplasm of ovary: Secondary | ICD-10-CM | POA: Insufficient documentation

## 2015-10-16 NOTE — Consult Note (Signed)
Except an outstanding is perfect of Radiation Oncology NEW PATIENT EVALUATION  Name: Amy Holland  MRN: [id number]  Date:   10/16/2015     DOB: [birth year]   This 55 y.o. 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 Breast Cancer    Pt is here for initial consultation of breast cancer.      DIAGNOSIS: The encounter diagnosis was Malignant neoplasm of lower-outer quadrant of right female breast ([place]).   PREVIOUS INVESTIGATIONS:  Mammograms ultrasound reviewed Pathology reports reviewed Clinical notes reviewed  HPI: Patient is a 55 year old female with a history of over 20 years ago having lumpectomy and radiation of the left breast plus systemic chemotherapy which aren't to track those records from [place] from over 20 years ago. Recently presented with an abnormal mammogram of the right breast showing a 6 x 7 mm mass at the 7:00 position 4 cm from the nipple. This was confirmed on ultrasound and underwent targeted core biopsy.. This was positive for invasive mammary carcinoma. Patient on a wide local excision and sentinel node biopsy showing a 1.1 cm invasive mammary carcinoma with clear margins at 3 mm. Tumor was overall nuclear grade 2. 2 sentinel lymph nodes were reviewed both negative for metastatic disease. Again tumor was ER/PR positive HER-2/neu not overexpressed. She had not upper and performed showing a low risk of recurrence. Postoperative she is done well. She specifically denies breast tenderness cough or bone pain. She is now referred to radiation oncology for opinion. According to surgeon's notes patient is not a candidate for accelerated partial breast  irradiation [place] to close skin margin.  PLANNED TREATMENT REGIMEN: Whole breast radiation  PAST MEDICAL HISTORY:  has a past medical history of Depression; Headache; Breast cancer ([place]) (1994); and Breast cancer of lower-outer quadrant of right female breast ([place]) (09/24/2015).    PAST SURGICAL HISTORY:  Past Surgical History  Procedure Laterality Date  . Ectopic pregnancy surgery  1989  . Partial mastectomy with axillary sentinel lymph node biopsy Right 09/24/2015    Procedure: PARTIAL MASTECTOMY WITH AXILLARY SENTINEL LYMPH NODE BIOPSY, WIDE EXCISION, RECONSTRUCTION/MASTOPLASTY;  Surgeon: Robert Bellow, MD;  Location: [facility] ORS;  Service: General;  Laterality: Right;  . Breast lumpectomy Left 1994    with lymph node removal Dr Amalia Hailey in [place]: family history includes Breast cancer in her sister; Breast cancer (age of onset: 91) in her sister; Breast cancer (age of onset: 59) in her sister; Ovarian cancer in her mother.  SOCIAL HISTORY:  reports that she quit smoking about 32 years ago. She has never used smokeless tobacco. She reports that she drinks alcohol. She reports that she does not use illicit drugs.  ALLERGIES: Latex  MEDICATIONS:  Current Outpatient Prescriptions  Medication Sig Dispense Refill  . fluticasone (FLONASE) 50 MCG/ACT nasal spray Place 1 spray into both nostrils daily.    . Loratadine 10 MG CAPS Take by mouth.    . Multiple Vitamin (MULTIVITAMIN) tablet Take 1 tablet by mouth daily.    . SUMAtriptan (IMITREX) 50 MG tablet Take 50 mg by mouth every 2 (two) hours as needed for migraine. May repeat in 2 hours if  headache persists or recurs.     No current facility-administered medications for this encounter.    ECOG PERFORMANCE STATUS:  0 - Asymptomatic  REVIEW OF SYSTEMS:  Patient denies any weight loss, fatigue, weakness, fever, chills or night sweats. Patient denies any loss of vision, blurred vision. Patient denies any ringing   of the ears or hearing loss. No irregular heartbeat. Patient denies heart murmur or history of fainting. Patient denies any chest pain or pain radiating to her upper extremities. Patient denies any shortness of breath, difficulty breathing at night, cough or hemoptysis. Patient denies any swelling in the lower legs. Patient denies any nausea vomiting, vomiting of blood, or coffee ground material in the vomitus. Patient denies any stomach pain. Patient states has had normal bowel movements no significant constipation or diarrhea. Patient denies any dysuria, hematuria or significant nocturia. Patient denies any problems walking, swelling in the joints or loss of balance. Patient denies any skin changes, loss of hair or loss of weight. Patient denies any excessive worrying or anxiety or significant depression. Patient denies any problems with insomnia. Patient denies excessive thirst, polyuria, polydipsia. Patient denies any swollen glands, patient denies easy bruising or easy bleeding. Patient denies any recent infections, allergies or URI. Patient "s visual fields have not changed significantly in recent time.    PHYSICAL EXAM: BP 142/76 mmHg  Pulse 71  Temp(Src) 98 F (36.7 C)  Resp 20  Wt 157 lb 15.4 oz (71.65 kg)  LMP  (Approximate) Patient is status post old wide local excision of the left breast with some tethering of the skin towards the scar. She's also recently status post right wide local excision with incision healing well. No dominant mass or nodularity is noted in either breast in 2 positions examined. No axillary or supraclavicular adenopathy is appreciated. Well-developed well-nourished patient in NAD. HEENT reveals PERLA, EOMI, discs not visualized.  Oral cavity is clear. No oral mucosal lesions are identified. Neck is clear without evidence of cervical or supraclavicular adenopathy. Lungs are clear to A&P. Cardiac examination is essentially unremarkable with regular rate and rhythm without  murmur rub or thrill. Abdomen is benign with no organomegaly or masses noted. Motor sensory and DTR levels are equal and symmetric in the upper and lower extremities. Cranial nerves II through XII are grossly intact. Proprioception is intact. No peripheral adenopathy or edema is identified. No motor or sensory levels are noted. Crude visual fields are within normal range.  LABORATORY DATA: Pathology reports reviewed    RADIOLOGY RESULTS: Mammograms and ultrasound reviewed   IMPRESSION: Stage I invasive mammary carcinoma the right breast and patient previous he treated with whole breast radiation 20 years prior now with stage I ER/PR positive HER-2/neu negative right breast cancer status post wide local excision and sentinel node biopsy  PLAN: At this time I have recommended whole breast radiation. Patient's breasts are rather large on the right side more contracted on the left side. Do not think she would be a good candidate for hyperfractionated radiation based on her breast size. I have recommended 5000 cGy over 5 weeks boosting her scar another 1400 cGy using electron beam. Risks and benefits of treatment including skin reaction fatigue inclusion of partial superficial lung alteration of blood counts all were discussed in detail with the patient. She seems to comprehend my treatment plan well. I have personally set up and ordered CT simulation for later this week. Patient also will be candidate for aromatase inhibitor therapy after completion of radiation. Patient  has requested being referred to medical oncology for opinion and that will be taken care of the next several weeks.  I would like to take this opportunity for allowing me to participate in the care of your patient.Armstead Peaks., MD

## 2015-10-16 NOTE — Progress Notes (Signed)
Verbal and written education reviewed with patient.  Patient verbalized understanding.  All questions answered to her satisfaction.   Approximately 10 minutes spent on education.

## 2015-10-17 ENCOUNTER — Ambulatory Visit
Admission: RE | Admit: 2015-10-17 | Discharge: 2015-10-17 | Disposition: A | Discharge status: Home / Self Care | Payer: No Typology Code available for payment source | Source: Ambulatory Visit | Attending: Radiation Oncology | Admitting: Radiation Oncology

## 2015-10-17 DIAGNOSIS — Z87891 Personal history of nicotine dependence: Secondary | ICD-10-CM | POA: Diagnosis not present

## 2015-10-17 DIAGNOSIS — Z853 Personal history of malignant neoplasm of breast: Secondary | ICD-10-CM | POA: Diagnosis not present

## 2015-10-17 DIAGNOSIS — Z9013 Acquired absence of bilateral breasts and nipples: Secondary | ICD-10-CM | POA: Diagnosis not present

## 2015-10-17 DIAGNOSIS — C50511 Malignant neoplasm of lower-outer quadrant of right female breast: Secondary | ICD-10-CM | POA: Diagnosis not present

## 2015-10-17 DIAGNOSIS — Z8041 Family history of malignant neoplasm of ovary: Secondary | ICD-10-CM | POA: Diagnosis not present

## 2015-10-17 DIAGNOSIS — F329 Major depressive disorder, single episode, unspecified: Secondary | ICD-10-CM | POA: Diagnosis not present

## 2015-10-17 DIAGNOSIS — Z170 Estrogen receptor positive status [ER+]: Secondary | ICD-10-CM | POA: Diagnosis not present

## 2015-10-17 DIAGNOSIS — Z803 Family history of malignant neoplasm of breast: Secondary | ICD-10-CM | POA: Diagnosis not present

## 2015-10-17 DIAGNOSIS — Z510 Encounter for antineoplastic radiation therapy: Secondary | ICD-10-CM | POA: Diagnosis present

## 2015-10-17 DIAGNOSIS — Z79899 Other long term (current) drug therapy: Secondary | ICD-10-CM | POA: Diagnosis not present

## 2015-10-18 ENCOUNTER — Other Ambulatory Visit: Payer: Self-pay | Admitting: *Deleted

## 2015-10-18 DIAGNOSIS — C50511 Malignant neoplasm of lower-outer quadrant of right female breast: Secondary | ICD-10-CM

## 2015-10-18 DIAGNOSIS — Z510 Encounter for antineoplastic radiation therapy: Secondary | ICD-10-CM | POA: Diagnosis not present

## 2015-10-22 ENCOUNTER — Ambulatory Visit: Payer: No Typology Code available for payment source

## 2015-10-24 ENCOUNTER — Ambulatory Visit
Admission: RE | Admit: 2015-10-24 | Discharge: 2015-10-24 | Disposition: A | Discharge status: Home / Self Care | Payer: No Typology Code available for payment source | Source: Ambulatory Visit | Attending: Radiation Oncology | Admitting: Radiation Oncology

## 2015-10-24 DIAGNOSIS — Z510 Encounter for antineoplastic radiation therapy: Secondary | ICD-10-CM | POA: Diagnosis not present

## 2015-10-28 ENCOUNTER — Ambulatory Visit
Admission: RE | Admit: 2015-10-28 | Discharge: 2015-10-28 | Disposition: A | Discharge status: Home / Self Care | Payer: No Typology Code available for payment source | Source: Ambulatory Visit | Attending: Radiation Oncology | Admitting: Radiation Oncology

## 2015-10-28 DIAGNOSIS — Z510 Encounter for antineoplastic radiation therapy: Secondary | ICD-10-CM | POA: Diagnosis not present

## 2015-10-29 ENCOUNTER — Ambulatory Visit
Admission: RE | Admit: 2015-10-29 | Discharge: 2015-10-29 | Disposition: A | Discharge status: Home / Self Care | Payer: No Typology Code available for payment source | Source: Ambulatory Visit | Attending: Radiation Oncology | Admitting: Radiation Oncology

## 2015-10-29 DIAGNOSIS — Z510 Encounter for antineoplastic radiation therapy: Secondary | ICD-10-CM | POA: Diagnosis not present

## 2015-10-30 ENCOUNTER — Ambulatory Visit
Admission: RE | Admit: 2015-10-30 | Discharge: 2015-10-30 | Disposition: A | Discharge status: Home / Self Care | Payer: No Typology Code available for payment source | Source: Ambulatory Visit | Attending: Radiation Oncology | Admitting: Radiation Oncology

## 2015-10-30 DIAGNOSIS — Z510 Encounter for antineoplastic radiation therapy: Secondary | ICD-10-CM | POA: Diagnosis not present

## 2015-10-31 ENCOUNTER — Ambulatory Visit
Admission: RE | Admit: 2015-10-31 | Discharge: 2015-10-31 | Disposition: A | Discharge status: Home / Self Care | Payer: No Typology Code available for payment source | Source: Ambulatory Visit | Attending: Radiation Oncology | Admitting: Radiation Oncology

## 2015-10-31 DIAGNOSIS — Z510 Encounter for antineoplastic radiation therapy: Secondary | ICD-10-CM | POA: Diagnosis not present

## 2015-11-01 ENCOUNTER — Ambulatory Visit
Admission: RE | Admit: 2015-11-01 | Discharge: 2015-11-01 | Disposition: A | Discharge status: Home / Self Care | Payer: No Typology Code available for payment source | Source: Ambulatory Visit | Attending: Radiation Oncology | Admitting: Radiation Oncology

## 2015-11-01 DIAGNOSIS — Z510 Encounter for antineoplastic radiation therapy: Secondary | ICD-10-CM | POA: Diagnosis not present

## 2015-11-05 ENCOUNTER — Ambulatory Visit
Admission: RE | Admit: 2015-11-05 | Discharge: 2015-11-05 | Disposition: A | Discharge status: Home / Self Care | Payer: No Typology Code available for payment source | Source: Ambulatory Visit | Attending: Radiation Oncology | Admitting: Radiation Oncology

## 2015-11-05 DIAGNOSIS — Z510 Encounter for antineoplastic radiation therapy: Secondary | ICD-10-CM | POA: Diagnosis not present

## 2015-11-06 ENCOUNTER — Ambulatory Visit
Admission: RE | Admit: 2015-11-06 | Discharge: 2015-11-06 | Disposition: A | Discharge status: Home / Self Care | Payer: No Typology Code available for payment source | Source: Ambulatory Visit | Attending: Radiation Oncology | Admitting: Radiation Oncology

## 2015-11-06 DIAGNOSIS — Z510 Encounter for antineoplastic radiation therapy: Secondary | ICD-10-CM | POA: Diagnosis not present

## 2015-11-07 ENCOUNTER — Ambulatory Visit
Admission: RE | Admit: 2015-11-07 | Discharge: 2015-11-07 | Disposition: A | Discharge status: Home / Self Care | Payer: No Typology Code available for payment source | Source: Ambulatory Visit | Attending: Radiation Oncology | Admitting: Radiation Oncology

## 2015-11-07 ENCOUNTER — Ambulatory Visit: Payer: PRIVATE HEALTH INSURANCE | Admitting: General Surgery

## 2015-11-07 DIAGNOSIS — Z510 Encounter for antineoplastic radiation therapy: Secondary | ICD-10-CM | POA: Diagnosis not present

## 2015-11-08 ENCOUNTER — Ambulatory Visit
Admission: RE | Admit: 2015-11-08 | Discharge: 2015-11-08 | Disposition: A | Discharge status: Home / Self Care | Payer: No Typology Code available for payment source | Source: Ambulatory Visit | Attending: Radiation Oncology | Admitting: Radiation Oncology

## 2015-11-08 DIAGNOSIS — Z510 Encounter for antineoplastic radiation therapy: Secondary | ICD-10-CM | POA: Diagnosis not present

## 2015-11-11 ENCOUNTER — Ambulatory Visit
Admission: RE | Admit: 2015-11-11 | Discharge: 2015-11-11 | Disposition: A | Discharge status: Home / Self Care | Payer: No Typology Code available for payment source | Source: Ambulatory Visit | Attending: Radiation Oncology | Admitting: Radiation Oncology

## 2015-11-11 ENCOUNTER — Encounter: Payer: Self-pay | Admitting: General Surgery

## 2015-11-11 ENCOUNTER — Ambulatory Visit (INDEPENDENT_AMBULATORY_CARE_PROVIDER_SITE_OTHER): Payer: PRIVATE HEALTH INSURANCE | Admitting: General Surgery

## 2015-11-11 VITALS — BP 132/66 | HR 82 | Resp 12 | Ht 65.0 in | Wt 160.0 lb

## 2015-11-11 DIAGNOSIS — Z510 Encounter for antineoplastic radiation therapy: Secondary | ICD-10-CM | POA: Diagnosis not present

## 2015-11-11 DIAGNOSIS — C50511 Malignant neoplasm of lower-outer quadrant of right female breast: Secondary | ICD-10-CM

## 2015-11-11 NOTE — Progress Notes (Signed)
Patient ID: Amy Holland, female   DOB: 10/05/1960, 55 y.o.   MRN: [id number]  Chief Complaint  Patient presents with  . Routine Post Op    rt mastectomy    HPI Amy Holland is a 55 y.o. female here today for her postoperative visit, right breast partial mastectomy on 09-24-15. She states she is doing well. She has been tolerating radiation treatments well.  HPI  Past Medical History  Diagnosis Date  . Depression   . Headache     migraine  . Breast cancer ([place]) 1994    left lumpectomy with lymph node removal c Radiation and chemo  . Breast cancer of lower-outer quadrant of right female breast ([place]) 09/24/2015    Right breast, 7 o'clock:11 mm, histologic grade 2, T1c, N0; ER/PR positive, HER-2/neu not overexpressed. Negative margins    Past Surgical History  Procedure Laterality Date  . Ectopic pregnancy surgery  1989  . Partial mastectomy with axillary sentinel lymph node biopsy Right 09/24/2015    Procedure: PARTIAL MASTECTOMY WITH AXILLARY SENTINEL LYMPH NODE BIOPSY, WIDE EXCISION, RECONSTRUCTION/MASTOPLASTY;  Surgeon: Robert Bellow, MD;  Location: [facility] ORS;  Service: General;  Laterality: Right;  . Breast lumpectomy Left 1994    with lymph node removal Dr Amalia Hailey in [place]     Family History  Problem Relation Age of Onset  . Breast cancer Sister 51    BRCA positive.  . Breast cancer Sister 22    BRCA negative  . Ovarian cancer Mother   . Breast cancer Sister     BRCA positive    Social History Social History  Substance Use Topics  . Smoking status: Former Smoker    Quit date: 06/19/1983  . Smokeless tobacco: Never Used  . Alcohol Use: 0.0 oz/week    0 Standard drinks or equivalent per week     Comment: 2 drinks per day    Allergies  Allergen Reactions  . Latex Rash    Current Outpatient Prescriptions  Medication Sig Dispense Refill  . fluticasone (FLONASE) 50 MCG/ACT nasal spray Place 1 spray into both nostrils daily.    Marland Kitchen ibuprofen (GOODSENSE  IBUPROFEN) 200 MG tablet Take by mouth.    . Loratadine 10 MG CAPS Take by mouth.    . Multiple Vitamin (MULTIVITAMIN) tablet Take 1 tablet by mouth daily.    . SUMAtriptan (IMITREX) 50 MG tablet Take 50 mg by mouth every 2 (two) hours as needed for migraine. May repeat in 2 hours if headache persists or recurs.     No current facility-administered medications for this visit.    Review of Systems Review of Systems  Constitutional: Negative.   Respiratory: Negative.   Cardiovascular: Negative.     Blood pressure 132/66, pulse 82, resp. rate 12, height _0  (1.651 m), weight 160 lb (72.576 kg).  Physical Exam Physical Exam  Constitutional: She is oriented to person, place, and time. She appears well-developed and well-nourished.  Eyes: Conjunctivae are normal. No scleral icterus.  Neck: Neck supple. No thyromegaly present.  Pulmonary/Chest: Right breast exhibits no inverted nipple, no mass, no nipple discharge, no skin change and no tenderness. Left breast exhibits no inverted nipple, no mass, no nipple discharge, no skin change and no tenderness.    Incision well healed right breast at 7 o'clock   Musculoskeletal:       Arms: Lymphadenopathy:    She has no cervical adenopathy.  Neurological: She is alert and oriented to person, place, and time.  Skin: Skin  is warm and dry.       Assessment    Doing well status post breast conservation. 10/36 radiation treatments and plated.    Plan        Warm compresses to heresolve mild residual swelling of the wide excision site. Care 2 avoid excess heat.  Dr. Baruch Gouty spoke with the patient about formal medical oncology assessment. At the time of her last visit here she was sort of upper down about the idea. At this time she is more interested. I have suggested that she might have an evaluation with Lorenda Cahill, M.D. We'll try to arrange this at a time corresponding to her scheduled radiation therapy treatments to minimize  travel.  The patient had a low Mammoprint score making the likelihood of benefit of adjuvant chemotherapy fairly modest. After formal assessment she can decide if she wants to continue routine follow-up through their office or not. Certainly, antiestrogen therapy will be appropriate after radiation is completed.  Follow-up exam in 2 months.  This information has been scribed by Verlene Mayer, CMA   PCP: Dr. Cephas Darby, [place] 11/11/2015, 9:13 PM

## 2015-11-11 NOTE — Patient Instructions (Signed)
Warm compresses to area to help soften the area. Return in 2 months.

## 2015-11-12 ENCOUNTER — Ambulatory Visit
Admission: RE | Admit: 2015-11-12 | Discharge: 2015-11-12 | Disposition: A | Discharge status: Home / Self Care | Payer: No Typology Code available for payment source | Source: Ambulatory Visit | Attending: Radiation Oncology | Admitting: Radiation Oncology

## 2015-11-12 ENCOUNTER — Other Ambulatory Visit: Payer: Self-pay

## 2015-11-12 DIAGNOSIS — Z510 Encounter for antineoplastic radiation therapy: Secondary | ICD-10-CM | POA: Diagnosis not present

## 2015-11-12 DIAGNOSIS — C50511 Malignant neoplasm of lower-outer quadrant of right female breast: Secondary | ICD-10-CM

## 2015-11-13 ENCOUNTER — Ambulatory Visit
Admission: RE | Admit: 2015-11-13 | Discharge: 2015-11-13 | Disposition: A | Discharge status: Home / Self Care | Payer: No Typology Code available for payment source | Source: Ambulatory Visit | Attending: Radiation Oncology | Admitting: Radiation Oncology

## 2015-11-13 ENCOUNTER — Inpatient Hospital Stay: Payer: No Typology Code available for payment source | Attending: Radiation Oncology

## 2015-11-13 DIAGNOSIS — C50911 Malignant neoplasm of unspecified site of right female breast: Secondary | ICD-10-CM | POA: Diagnosis present

## 2015-11-13 DIAGNOSIS — Z510 Encounter for antineoplastic radiation therapy: Secondary | ICD-10-CM | POA: Diagnosis not present

## 2015-11-13 DIAGNOSIS — Z170 Estrogen receptor positive status [ER+]: Secondary | ICD-10-CM | POA: Diagnosis not present

## 2015-11-13 DIAGNOSIS — C50511 Malignant neoplasm of lower-outer quadrant of right female breast: Secondary | ICD-10-CM

## 2015-11-13 LAB — CBC
HEMATOCRIT: 43.5 % (ref 35.0–47.0)
HEMOGLOBIN: 15.1 g/dL (ref 12.0–16.0)
MCH: 31 pg (ref 26.0–34.0)
MCHC: 34.6 g/dL (ref 32.0–36.0)
MCV: 89.5 fL (ref 80.0–100.0)
Platelets: 210 10*3/uL (ref 150–440)
RBC: 4.86 MIL/uL (ref 3.80–5.20)
RDW: 12.9 % (ref 11.5–14.5)
WBC: 4.7 10*3/uL (ref 3.6–11.0)

## 2015-11-14 ENCOUNTER — Ambulatory Visit
Admission: RE | Admit: 2015-11-14 | Discharge: 2015-11-14 | Disposition: A | Discharge status: Home / Self Care | Payer: No Typology Code available for payment source | Source: Ambulatory Visit | Attending: Radiation Oncology | Admitting: Radiation Oncology

## 2015-11-14 DIAGNOSIS — Z510 Encounter for antineoplastic radiation therapy: Secondary | ICD-10-CM | POA: Diagnosis not present

## 2015-11-15 ENCOUNTER — Ambulatory Visit
Admission: RE | Admit: 2015-11-15 | Discharge: 2015-11-15 | Disposition: A | Discharge status: Home / Self Care | Payer: No Typology Code available for payment source | Source: Ambulatory Visit | Attending: Radiation Oncology | Admitting: Radiation Oncology

## 2015-11-15 DIAGNOSIS — Z510 Encounter for antineoplastic radiation therapy: Secondary | ICD-10-CM | POA: Diagnosis not present

## 2015-11-18 ENCOUNTER — Ambulatory Visit
Admission: RE | Admit: 2015-11-18 | Discharge: 2015-11-18 | Disposition: A | Discharge status: Home / Self Care | Payer: No Typology Code available for payment source | Source: Ambulatory Visit | Attending: Radiation Oncology | Admitting: Radiation Oncology

## 2015-11-18 DIAGNOSIS — Z510 Encounter for antineoplastic radiation therapy: Secondary | ICD-10-CM | POA: Diagnosis not present

## 2015-11-19 ENCOUNTER — Ambulatory Visit
Admission: RE | Admit: 2015-11-19 | Discharge: 2015-11-19 | Disposition: A | Discharge status: Home / Self Care | Payer: No Typology Code available for payment source | Source: Ambulatory Visit | Attending: Radiation Oncology | Admitting: Radiation Oncology

## 2015-11-19 DIAGNOSIS — Z510 Encounter for antineoplastic radiation therapy: Secondary | ICD-10-CM | POA: Diagnosis not present

## 2015-11-20 ENCOUNTER — Ambulatory Visit
Admission: RE | Admit: 2015-11-20 | Discharge: 2015-11-20 | Disposition: A | Discharge status: Home / Self Care | Payer: No Typology Code available for payment source | Source: Ambulatory Visit | Attending: Radiation Oncology | Admitting: Radiation Oncology

## 2015-11-20 DIAGNOSIS — Z510 Encounter for antineoplastic radiation therapy: Secondary | ICD-10-CM | POA: Diagnosis not present

## 2015-11-21 ENCOUNTER — Ambulatory Visit
Admission: RE | Admit: 2015-11-21 | Discharge: 2015-11-21 | Disposition: A | Discharge status: Home / Self Care | Payer: No Typology Code available for payment source | Source: Ambulatory Visit | Attending: Radiation Oncology | Admitting: Radiation Oncology

## 2015-11-21 DIAGNOSIS — Z510 Encounter for antineoplastic radiation therapy: Secondary | ICD-10-CM | POA: Diagnosis not present

## 2015-11-22 ENCOUNTER — Ambulatory Visit
Admission: RE | Admit: 2015-11-22 | Discharge: 2015-11-22 | Disposition: A | Discharge status: Home / Self Care | Payer: No Typology Code available for payment source | Source: Ambulatory Visit | Attending: Radiation Oncology | Admitting: Radiation Oncology

## 2015-11-22 DIAGNOSIS — Z510 Encounter for antineoplastic radiation therapy: Secondary | ICD-10-CM | POA: Diagnosis not present

## 2015-11-25 ENCOUNTER — Ambulatory Visit
Admission: RE | Admit: 2015-11-25 | Discharge: 2015-11-25 | Disposition: A | Discharge status: Home / Self Care | Payer: No Typology Code available for payment source | Source: Ambulatory Visit | Attending: Radiation Oncology | Admitting: Radiation Oncology

## 2015-11-25 DIAGNOSIS — Z510 Encounter for antineoplastic radiation therapy: Secondary | ICD-10-CM | POA: Diagnosis not present

## 2015-11-26 ENCOUNTER — Ambulatory Visit
Admission: RE | Admit: 2015-11-26 | Discharge: 2015-11-26 | Disposition: A | Discharge status: Home / Self Care | Payer: No Typology Code available for payment source | Source: Ambulatory Visit | Attending: Radiation Oncology | Admitting: Radiation Oncology

## 2015-11-26 DIAGNOSIS — Z510 Encounter for antineoplastic radiation therapy: Secondary | ICD-10-CM | POA: Diagnosis not present

## 2015-11-27 ENCOUNTER — Inpatient Hospital Stay: Payer: No Typology Code available for payment source

## 2015-11-27 ENCOUNTER — Ambulatory Visit
Admission: RE | Admit: 2015-11-27 | Discharge: 2015-11-27 | Disposition: A | Discharge status: Home / Self Care | Payer: No Typology Code available for payment source | Source: Ambulatory Visit | Attending: Radiation Oncology | Admitting: Radiation Oncology

## 2015-11-27 DIAGNOSIS — C50511 Malignant neoplasm of lower-outer quadrant of right female breast: Secondary | ICD-10-CM

## 2015-11-27 DIAGNOSIS — Z510 Encounter for antineoplastic radiation therapy: Secondary | ICD-10-CM | POA: Diagnosis not present

## 2015-11-27 DIAGNOSIS — C50911 Malignant neoplasm of unspecified site of right female breast: Secondary | ICD-10-CM | POA: Diagnosis not present

## 2015-11-27 LAB — CBC
HCT: 42.5 % (ref 35.0–47.0)
Hemoglobin: 14.5 g/dL (ref 12.0–16.0)
MCH: 30.6 pg (ref 26.0–34.0)
MCHC: 34.2 g/dL (ref 32.0–36.0)
MCV: 89.5 fL (ref 80.0–100.0)
PLATELETS: 190 10*3/uL (ref 150–440)
RBC: 4.75 MIL/uL (ref 3.80–5.20)
RDW: 13.2 % (ref 11.5–14.5)
WBC: 4.4 10*3/uL (ref 3.6–11.0)

## 2015-11-28 ENCOUNTER — Ambulatory Visit
Admission: RE | Admit: 2015-11-28 | Discharge: 2015-11-28 | Disposition: A | Discharge status: Home / Self Care | Payer: No Typology Code available for payment source | Source: Ambulatory Visit | Attending: Radiation Oncology | Admitting: Radiation Oncology

## 2015-11-28 DIAGNOSIS — Z510 Encounter for antineoplastic radiation therapy: Secondary | ICD-10-CM | POA: Diagnosis not present

## 2015-11-29 ENCOUNTER — Ambulatory Visit
Admission: RE | Admit: 2015-11-29 | Discharge: 2015-11-29 | Disposition: A | Discharge status: Home / Self Care | Payer: No Typology Code available for payment source | Source: Ambulatory Visit | Attending: Radiation Oncology | Admitting: Radiation Oncology

## 2015-11-29 DIAGNOSIS — Z510 Encounter for antineoplastic radiation therapy: Secondary | ICD-10-CM | POA: Diagnosis not present

## 2015-12-02 ENCOUNTER — Ambulatory Visit
Admission: RE | Admit: 2015-12-02 | Discharge: 2015-12-02 | Disposition: A | Discharge status: Home / Self Care | Payer: No Typology Code available for payment source | Source: Ambulatory Visit | Attending: Radiation Oncology | Admitting: Radiation Oncology

## 2015-12-02 DIAGNOSIS — Z510 Encounter for antineoplastic radiation therapy: Secondary | ICD-10-CM | POA: Diagnosis not present

## 2015-12-03 ENCOUNTER — Ambulatory Visit
Admission: RE | Admit: 2015-12-03 | Discharge: 2015-12-03 | Disposition: A | Discharge status: Home / Self Care | Payer: No Typology Code available for payment source | Source: Ambulatory Visit | Attending: Radiation Oncology | Admitting: Radiation Oncology

## 2015-12-03 DIAGNOSIS — Z510 Encounter for antineoplastic radiation therapy: Secondary | ICD-10-CM | POA: Diagnosis not present

## 2015-12-04 ENCOUNTER — Ambulatory Visit
Admission: RE | Admit: 2015-12-04 | Discharge: 2015-12-04 | Disposition: A | Discharge status: Home / Self Care | Payer: No Typology Code available for payment source | Source: Ambulatory Visit | Attending: Radiation Oncology | Admitting: Radiation Oncology

## 2015-12-04 DIAGNOSIS — Z510 Encounter for antineoplastic radiation therapy: Secondary | ICD-10-CM | POA: Diagnosis not present

## 2015-12-05 ENCOUNTER — Ambulatory Visit
Admission: RE | Admit: 2015-12-05 | Discharge: 2015-12-05 | Disposition: A | Discharge status: Home / Self Care | Payer: No Typology Code available for payment source | Source: Ambulatory Visit | Attending: Radiation Oncology | Admitting: Radiation Oncology

## 2015-12-05 DIAGNOSIS — Z510 Encounter for antineoplastic radiation therapy: Secondary | ICD-10-CM | POA: Diagnosis not present

## 2015-12-06 ENCOUNTER — Ambulatory Visit
Admission: RE | Admit: 2015-12-06 | Discharge: 2015-12-06 | Disposition: A | Discharge status: Home / Self Care | Payer: No Typology Code available for payment source | Source: Ambulatory Visit | Attending: Radiation Oncology | Admitting: Radiation Oncology

## 2015-12-06 DIAGNOSIS — Z510 Encounter for antineoplastic radiation therapy: Secondary | ICD-10-CM | POA: Diagnosis not present

## 2015-12-09 ENCOUNTER — Ambulatory Visit
Admission: RE | Admit: 2015-12-09 | Discharge: 2015-12-09 | Disposition: A | Discharge status: Home / Self Care | Payer: No Typology Code available for payment source | Source: Ambulatory Visit | Attending: Radiation Oncology | Admitting: Radiation Oncology

## 2015-12-09 DIAGNOSIS — Z510 Encounter for antineoplastic radiation therapy: Secondary | ICD-10-CM | POA: Diagnosis not present

## 2015-12-11 ENCOUNTER — Inpatient Hospital Stay: Payer: No Typology Code available for payment source | Attending: Radiation Oncology

## 2015-12-11 ENCOUNTER — Ambulatory Visit
Admission: RE | Admit: 2015-12-11 | Discharge: 2015-12-11 | Disposition: A | Discharge status: Home / Self Care | Payer: No Typology Code available for payment source | Source: Ambulatory Visit | Attending: Radiation Oncology | Admitting: Radiation Oncology

## 2015-12-11 DIAGNOSIS — Z923 Personal history of irradiation: Secondary | ICD-10-CM | POA: Insufficient documentation

## 2015-12-11 DIAGNOSIS — F329 Major depressive disorder, single episode, unspecified: Secondary | ICD-10-CM | POA: Insufficient documentation

## 2015-12-11 DIAGNOSIS — C50511 Malignant neoplasm of lower-outer quadrant of right female breast: Secondary | ICD-10-CM | POA: Diagnosis not present

## 2015-12-11 DIAGNOSIS — Z803 Family history of malignant neoplasm of breast: Secondary | ICD-10-CM | POA: Diagnosis not present

## 2015-12-11 DIAGNOSIS — Z79899 Other long term (current) drug therapy: Secondary | ICD-10-CM | POA: Insufficient documentation

## 2015-12-11 DIAGNOSIS — Z8041 Family history of malignant neoplasm of ovary: Secondary | ICD-10-CM | POA: Insufficient documentation

## 2015-12-11 DIAGNOSIS — Z170 Estrogen receptor positive status [ER+]: Secondary | ICD-10-CM | POA: Insufficient documentation

## 2015-12-11 DIAGNOSIS — Z87891 Personal history of nicotine dependence: Secondary | ICD-10-CM | POA: Diagnosis not present

## 2015-12-11 DIAGNOSIS — Z853 Personal history of malignant neoplasm of breast: Secondary | ICD-10-CM | POA: Insufficient documentation

## 2015-12-11 DIAGNOSIS — Z510 Encounter for antineoplastic radiation therapy: Secondary | ICD-10-CM | POA: Diagnosis not present

## 2015-12-11 DIAGNOSIS — R51 Headache: Secondary | ICD-10-CM | POA: Insufficient documentation

## 2015-12-11 LAB — CBC
HEMATOCRIT: 42.3 % (ref 35.0–47.0)
HEMOGLOBIN: 14.4 g/dL (ref 12.0–16.0)
MCH: 30.9 pg (ref 26.0–34.0)
MCHC: 34.1 g/dL (ref 32.0–36.0)
MCV: 90.4 fL (ref 80.0–100.0)
PLATELETS: 175 10*3/uL (ref 150–440)
RBC: 4.67 MIL/uL (ref 3.80–5.20)
RDW: 13.2 % (ref 11.5–14.5)
WBC: 4.8 10*3/uL (ref 3.6–11.0)

## 2015-12-12 ENCOUNTER — Ambulatory Visit
Admission: RE | Admit: 2015-12-12 | Discharge: 2015-12-12 | Disposition: A | Discharge status: Home / Self Care | Payer: No Typology Code available for payment source | Source: Ambulatory Visit | Attending: Radiation Oncology | Admitting: Radiation Oncology

## 2015-12-12 DIAGNOSIS — Z510 Encounter for antineoplastic radiation therapy: Secondary | ICD-10-CM | POA: Diagnosis not present

## 2015-12-13 ENCOUNTER — Ambulatory Visit
Admission: RE | Admit: 2015-12-13 | Discharge: 2015-12-13 | Disposition: A | Discharge status: Home / Self Care | Payer: No Typology Code available for payment source | Source: Ambulatory Visit | Attending: Radiation Oncology | Admitting: Radiation Oncology

## 2015-12-13 DIAGNOSIS — Z510 Encounter for antineoplastic radiation therapy: Secondary | ICD-10-CM | POA: Diagnosis not present

## 2015-12-16 ENCOUNTER — Ambulatory Visit
Admission: RE | Admit: 2015-12-16 | Discharge: 2015-12-16 | Disposition: A | Discharge status: Home / Self Care | Payer: No Typology Code available for payment source | Source: Ambulatory Visit | Attending: Radiation Oncology | Admitting: Radiation Oncology

## 2015-12-16 DIAGNOSIS — Z510 Encounter for antineoplastic radiation therapy: Secondary | ICD-10-CM | POA: Diagnosis not present

## 2015-12-17 ENCOUNTER — Ambulatory Visit
Admission: RE | Admit: 2015-12-17 | Discharge: 2015-12-17 | Disposition: A | Discharge status: Home / Self Care | Payer: No Typology Code available for payment source | Source: Ambulatory Visit | Attending: Radiation Oncology | Admitting: Radiation Oncology

## 2015-12-17 DIAGNOSIS — Z510 Encounter for antineoplastic radiation therapy: Secondary | ICD-10-CM | POA: Diagnosis not present

## 2015-12-18 ENCOUNTER — Ambulatory Visit
Admission: RE | Admit: 2015-12-18 | Discharge: 2015-12-18 | Disposition: A | Discharge status: Home / Self Care | Payer: No Typology Code available for payment source | Source: Ambulatory Visit | Attending: Radiation Oncology | Admitting: Radiation Oncology

## 2015-12-18 DIAGNOSIS — Z510 Encounter for antineoplastic radiation therapy: Secondary | ICD-10-CM | POA: Diagnosis not present

## 2015-12-23 ENCOUNTER — Encounter: Payer: Self-pay | Admitting: Hematology and Oncology

## 2015-12-23 ENCOUNTER — Inpatient Hospital Stay (HOSPITAL_BASED_OUTPATIENT_CLINIC_OR_DEPARTMENT_OTHER): Payer: No Typology Code available for payment source | Admitting: Hematology and Oncology

## 2015-12-23 VITALS — BP 138/92 | HR 67 | Temp 97.9°F | Resp 18 | Ht 63.98 in | Wt 162.1 lb

## 2015-12-23 DIAGNOSIS — Z923 Personal history of irradiation: Secondary | ICD-10-CM

## 2015-12-23 DIAGNOSIS — Z803 Family history of malignant neoplasm of breast: Secondary | ICD-10-CM

## 2015-12-23 DIAGNOSIS — Z79899 Other long term (current) drug therapy: Secondary | ICD-10-CM

## 2015-12-23 DIAGNOSIS — Z853 Personal history of malignant neoplasm of breast: Secondary | ICD-10-CM

## 2015-12-23 DIAGNOSIS — C50511 Malignant neoplasm of lower-outer quadrant of right female breast: Secondary | ICD-10-CM

## 2015-12-23 DIAGNOSIS — R51 Headache: Secondary | ICD-10-CM | POA: Diagnosis not present

## 2015-12-23 DIAGNOSIS — Z170 Estrogen receptor positive status [ER+]: Secondary | ICD-10-CM

## 2015-12-23 DIAGNOSIS — F329 Major depressive disorder, single episode, unspecified: Secondary | ICD-10-CM | POA: Diagnosis not present

## 2015-12-23 DIAGNOSIS — Z87891 Personal history of nicotine dependence: Secondary | ICD-10-CM

## 2015-12-23 DIAGNOSIS — Z8481 Family history of carrier of genetic disease: Secondary | ICD-10-CM

## 2015-12-23 DIAGNOSIS — Z8041 Family history of malignant neoplasm of ovary: Secondary | ICD-10-CM

## 2015-12-23 NOTE — Progress Notes (Signed)
Patient is here for new patient evaluation. No complaints. She is on a prednisone taper but is almost done with this.

## 2015-12-23 NOTE — Progress Notes (Signed)
[facility] day:  12/23/2015  Chief Complaint: Amy Holland is a 55 y.o. female with stage I right breast cancer s/p lumpectomy and radiation (2017), a history of stage I left breast cancer (1994), and a family history of BRCA1 who is referred in consultation by Dr. Bary Castilla for assessment and management.  HPI:  The patient was diagnosed with stage I left breast cancer in 1994 at the age of 26 after presenting with a left breast lump.  No pathology is available.  She was [facility] at [facility]  She underwent lumpectomy.  She states that hormone receptor status was negative (? triple negative).  She received 5 cycles of Adriamycin and Cytoxan (AC).  She received radiation.  The patient states that she was followed closely secondary to her family history.  Her mother died of ovarian cancer at age 71.  Her older sister was diagnosed with breast cancer at age 63.  Another sister developed breast cancer at age 30.  Genetic testing was offered.  She declined testing.  Two sisters have a BRCA1 mutation.  The sister with breast cancer at age 57 has the mutation.  The sister who developed breast cancer at age 70 did not have the mutation.  Another sister who is BRCA1 positive does not have cancer.  She is 45 and had her ovaries removed prophylactically.  The patient moved to [place] 7 years ago.  She was participating in a study at [facility] to assess the affect of exercise on breast cancer survival.  Mammogram on 07/17/2015 revealed possible right breast asymmetry.  Diagnostic right mammogram and ultrasound revealed a suspicious 7 mm irregular hypoechoic nodule at the 7o'clock position.  Ultrasound guided biopsy on 08/06/2015 revealed grade I invasive mammary cancer, no special type. Tumor was ER positive (> 90%), PR positive (11-50%), and Her2/neu 1+.  She was seen by Dr. Bary Castilla on 08/12/2015.  Lumpectomy and mastectomy options were  discussed.  She underwent wide local excision and sentinel lymph node biopsy on 09/24/2015.  Pathology revealed a 1.1 cm grade II invasive mammary carcinoma, no special type.  DCIS was not present.  There was no lymphovascular invasion.  Margins were negative.  Two lymph nodes were negative.  Pathologic stage was T1cN0Mx.  MammaPrint on 09/24/2015 revealed low risk luminal-type A with a distant rate of recurrence in 5 years of 5% (CI: 1-9%) and in 10 years of 10% (CI: 4-15%).  She received 50.4 Gy in 28 fractions from 10/28/2015 - 12/05/2015 to the right breast with a 14.4 Gy scar boost from 12/06/2015 - 12/18/2015.  She tolerated her radiation well with only minor erythematous changes.  She is postmenopausal (age 68; 2002).  She has 3 children.   Past Medical History  Diagnosis Date  . Depression   . Headache     migraine  . Breast cancer ([place]) 1994    left lumpectomy with lymph node removal c Radiation and chemo  . Breast cancer of lower-outer quadrant of right female breast ([place]) 09/24/2015    Right breast, 7 o'clock:11 mm, histologic grade 2, T1c, N0; ER/PR positive, HER-2/neu not overexpressed. Negative margins    Past Surgical History  Procedure Laterality Date  . Ectopic pregnancy surgery  1989  . Partial mastectomy with axillary sentinel lymph node biopsy Right 09/24/2015    Procedure: PARTIAL MASTECTOMY WITH AXILLARY SENTINEL LYMPH NODE BIOPSY, WIDE EXCISION, RECONSTRUCTION/MASTOPLASTY;  Surgeon: Robert Bellow, MD;  Location: [facility] ORS;  Service:  General;  Laterality: Right;  . Breast lumpectomy Left 1994    with lymph node removal Dr Amalia Hailey in [place]     Family History  Problem Relation Age of Onset  . Breast cancer Sister 79    BRCA positive.  . Breast cancer Sister 31    BRCA negative  . Ovarian cancer Mother   . Breast cancer Sister     BRCA positive    Social History:  reports that she quit smoking about 32 years ago. She has never used smokeless tobacco. She  reports that she drinks alcohol. She reports that she does not use illicit drugs.  She lived in [place] for 32 years.  She has lived in [place] for 7 years.  She has 3 children (2 boys and 1 girl).  Her daughter is 15 years old.  She works at [employer].  The patient is alone today.  Allergies:  Allergies  Allergen Reactions  . Latex Rash    Current Medications: Current Outpatient Prescriptions  Medication Sig Dispense Refill  . cetirizine (ZYRTEC) 10 MG tablet Take 10 mg by mouth daily.    . clotrimazole (LOTRIMIN) 1 % cream APPLY TO AFFECTED AREA TWICE A DAY FOR 2-4 WEES  1  . cyclobenzaprine (FLEXERIL) 10 MG tablet Take by mouth.    Marland Kitchen ibuprofen (GOODSENSE IBUPROFEN) 200 MG tablet Take by mouth.    . Multiple Vitamin (MULTIVITAMIN) tablet Take 1 tablet by mouth daily.    . SUMAtriptan (IMITREX) 50 MG tablet Take 50 mg by mouth every 2 (two) hours as needed for migraine. May repeat in 2 hours if headache persists or recurs.    . fluticasone (FLONASE) 50 MCG/ACT nasal spray Place 1 spray into both nostrils daily. Reported on 12/23/2015    . Loratadine 10 MG CAPS Take by mouth. Reported on 12/23/2015     No current facility-administered medications for this visit.    Review of Systems:  GENERAL:  Feels good.  No fevers, sweats or weight loss. PERFORMANCE STATUS (ECOG):  1 HEENT:  No visual changes, runny nose, sore throat, mouth sores or tenderness. Lungs: No shortness of breath or cough.  No hemoptysis. Cardiac:  No chest pain, palpitations, orthopnea, or PND. GI:  Appetite 75%.  No nausea, vomiting, diarrhea, constipation, melena or hematochezia. GU:  No urgency, frequency, dysuria, or hematuria. Musculoskeletal:  No back pain.  No joint pain.  No muscle tenderness. Extremities:  No pain or swelling. Skin:  Right lower extremity rash on prednisone taper.   Neuro:  No headache, numbness or weakness, balance or coordination issues. Endocrine:  No diabetes, thyroid issues,  hot flashes or night sweats. Psych:  Feels overwhelmed.  No depression. Pain:  No focal pain. Review of systems:  All other systems reviewed and found to be negative.  Physical Exam: Blood pressure 138/92, pulse 67, temperature 97.9 F (36.6 C), temperature source Tympanic, resp. rate 18, height 5' 3.98" (1.625 m), weight 162 lb 2.4 oz (73.55 kg). GENERAL:  Well developed, well nourished, woman sitting comfortably in the exam room in no acute distress.  She is tearful at times. MENTAL STATUS:  Alert and oriented to person, place and time. HEAD:  Shoulder length curly brown hair.  Normocephalic, atraumatic, face symmetric, no Cushingoid features. EYES:  Hazel/green eyes.  Pupils equal round and reactive to light and accomodation.  No conjunctivitis or scleral icterus. ENT:  Oropharynx clear without lesion.  Tongue normal. Mucous membranes moist.  RESPIRATORY:  Clear to auscultation without  rales, wheezes or rhonchi. CARDIOVASCULAR:  Regular rate and rhythm without murmur, rub or gallop. BREAST:  Right breast with well healed incision at the 7 o'clock position.  Post-operative and post radiation changes (hyperpigmentation).  No masses, skin changes or nipple discharge.  Left breast with well healed incision at 9 o'clock position.  Area tender.  No masses, skin changes or nipple discharge.  ABDOMEN:  Soft, non-tender, with active bowel sounds, and no hepatosplenomegaly.  No masses. SKIN:  Right lower extremity macular rash. EXTREMITIES: No edema, no skin discoloration or tenderness.  No palpable cords. LYMPH NODES: No palpable cervical, supraclavicular, axillary or inguinal adenopathy  NEUROLOGICAL: Unremarkable. PSYCH:  Appropriate.  No visits with results within 3 Day(s) from this visit. Latest known visit with results is:  Appointment on 12/11/2015  Component Date Value Ref Range Status  . WBC 12/11/2015 4.8  3.6 - 11.0 K/uL Final  . RBC 12/11/2015 4.67  3.80 - 5.20 MIL/uL Final  .  Hemoglobin 12/11/2015 14.4  12.0 - 16.0 g/dL Final  . HCT 12/11/2015 42.3  35.0 - 47.0 % Final  . MCV 12/11/2015 90.4  80.0 - 100.0 fL Final  . MCH 12/11/2015 30.9  26.0 - 34.0 pg Final  . MCHC 12/11/2015 34.1  32.0 - 36.0 g/dL Final  . RDW 12/11/2015 13.2  11.5 - 14.5 % Final  . Platelets 12/11/2015 175  150 - 440 K/uL Final    Assessment:  Amy Holland is a 55 y.o. female with a history of bilateral breast cancer and a family history of BRCA1 mutation.  She was diagnosed with stage I left breast cancer in [birth year] (age of 35).  No pathology is available (patient reports hormone receptor negative).  She underwent lumpectomy, followed by 5 cycles of Adriamycin and Cytoxan (AC) and radiation.    She has stage IA right breast cancer s/p wide excision and sentinel lymph node biopsy on 09/24/2015.  Pathology revealed a 1.1 cm grade II invasive mammary carcinoma.  There was no lymphovascular invasion.  Margins were negative.  Two lymph nodes were negative.  Pathologic stage was T1cN0Mx.  She received 50.4 Gy from 10/28/2015 - 12/05/2015 to the right breast with a 14.4 Gy scar boost from 12/06/2015 - 12/18/2015.    MammaPrint on 09/24/2015 revealed low risk luminal-type A with a distant rate of recurrence in 5 years of 5% (CI: 1-9%) and in 10 years of 10% (CI: 4-15%).    Family history is notable for 2 sisters with breast cancer (age 61 and 46).  Two sisters are BRCA1 positive (sister with breast cancer at age 41 and another sister without cancer).  She has declined BRCA1 testing.  Symptomatically, she is feeling overwhelmed with her diagnosis.  Exam reveals post-operative and post radiation changes.  Plan: 1.  Discuss diagnosis and management of breast cancer.  Discuss MammoPrint testing.  No indication for chemotherapy.  Discuss benefit of hormonal therapy.  Discuss aromatase inhibitors for at least 5 years.  Discuss side effects including vasomotor symptoms, joint aches, and bone thinning.  Discuss  baseline bone density study.  Patient reluctant to have hormonal therapy (she has been reading about it on the internet).  Briefly discussed tamoxifen and side effects.  Patient agreeable to bone density study.  Information provided on Femara.  Patient asked about statistics with and without hormonal therapy.  Discussed obtaining benefit-risk calculator print out, but noted her risk is potentially higher given possible BRCA1 mutation.  Briefly discussed Breat Cancer Index (BCI) testing in  future for extended adjuvant hormonal therapy benefit.  2.  Discuss family history of BRCA1 mutation and breast and ovarian cancer.  Discuss consideration of genetic testing.  Offered follow-up with Dietitian in [place].  Patient would like to defer at this time.  3.  Schedule baseline bone density study. 4.  RTC after bone density.   Lequita Asal, MD  12/23/2015, 12:28 PM

## 2015-12-24 DIAGNOSIS — Z8481 Family history of carrier of genetic disease: Secondary | ICD-10-CM | POA: Insufficient documentation

## 2016-01-08 ENCOUNTER — Encounter: Payer: Self-pay | Admitting: General Surgery

## 2016-01-08 ENCOUNTER — Ambulatory Visit (INDEPENDENT_AMBULATORY_CARE_PROVIDER_SITE_OTHER): Payer: PRIVATE HEALTH INSURANCE | Admitting: General Surgery

## 2016-01-08 VITALS — BP 140/80 | HR 72 | Resp 12 | Ht 65.0 in | Wt 164.0 lb

## 2016-01-08 DIAGNOSIS — C50511 Malignant neoplasm of lower-outer quadrant of right female breast: Secondary | ICD-10-CM | POA: Diagnosis not present

## 2016-01-08 DIAGNOSIS — Z853 Personal history of malignant neoplasm of breast: Secondary | ICD-10-CM

## 2016-01-08 NOTE — Progress Notes (Signed)
Patient ID: Amy Holland, female   DOB: Oct 09, 1960, 55 y.o.   MRN: [id number]  Chief Complaint  Patient presents with  . Routine Post Op    HPI Amy Holland is a 55 y.o. female.  Here today for postoperative right lumpectomy in April. She completed radiation December 18 2015. She states she is doing well, only mild tenderness right breast. She states her energy level is slowly improving. Bone density is scheduled for this week.  Marland KitchenHPI  Past Medical History:  Diagnosis Date  . Breast cancer ([place]) 1994   left lumpectomy with lymph node removal c Radiation and chemo  . Breast cancer of lower-outer quadrant of right female breast ([place]) 09/24/2015   Right breast, 7 o'clock:11 mm, histologic grade 2, T1c, N0; ER/PR positive, HER-2/neu not overexpressed. Negative margins  . Colonoscopy refused    Cologuard negative by patient report.  . Depression   . Headache    migraine    Past Surgical History:  Procedure Laterality Date  . BREAST LUMPECTOMY Left 1994   with lymph node removal Dr Amalia Hailey in [place]   . ECTOPIC PREGNANCY SURGERY  1989  . PARTIAL MASTECTOMY WITH AXILLARY SENTINEL LYMPH NODE BIOPSY Right 09/24/2015   Procedure: PARTIAL MASTECTOMY WITH AXILLARY SENTINEL LYMPH NODE BIOPSY, WIDE EXCISION, RECONSTRUCTION/MASTOPLASTY;  Surgeon: Robert Bellow, MD;  Location: [facility] ORS;  Service: General;  Laterality: Right;    Family History  Problem Relation Age of Onset  . Breast cancer Sister 60    BRCA positive.  . Breast cancer Sister 72    BRCA negative  . Ovarian cancer Mother   . Breast cancer Sister     BRCA positive    Social History Social History  Substance Use Topics  . Smoking status: Former Smoker    Quit date: 06/19/1983  . Smokeless tobacco: Never Used  . Alcohol use 0.0 oz/week     Comment: 2 drinks per day    Allergies  Allergen Reactions  . Latex Rash    Current Outpatient Prescriptions  Medication Sig Dispense Refill  . cetirizine (ZYRTEC) 10 MG tablet  Take 10 mg by mouth daily.    . cyclobenzaprine (FLEXERIL) 10 MG tablet Take by mouth 3 (three) times daily as needed.     Marland Kitchen ibuprofen (GOODSENSE IBUPROFEN) 200 MG tablet Take by mouth every 6 (six) hours as needed.     . Multiple Vitamin (MULTIVITAMIN) tablet Take 1 tablet by mouth daily.    . SUMAtriptan (IMITREX) 50 MG tablet Take 50 mg by mouth every 2 (two) hours as needed for migraine. May repeat in 2 hours if headache persists or recurs.     No current facility-administered medications for this visit.     Review of Systems Review of Systems  Constitutional: Negative.   Respiratory: Negative.   Cardiovascular: Negative.     Blood pressure 140/80, pulse 72, resp. rate 12, height 5' 5"  (1.651 m), weight 164 lb (74.4 kg).  Physical Exam Physical Exam  Constitutional: She is oriented to person, place, and time. She appears well-developed and well-nourished.  HENT:  Mouth/Throat: Oropharynx is clear and moist.  Eyes: Conjunctivae are normal. No scleral icterus.  Neck: Neck supple.  Cardiovascular: Normal rate, regular rhythm and normal heart sounds.   Pulmonary/Chest: Effort normal and breath sounds normal. Right breast exhibits no inverted nipple, no mass, no nipple discharge, no skin change and no tenderness.    Modest thickening at 7 o'clock right breast.  Lymphadenopathy:    She has  no cervical adenopathy.    She has no axillary adenopathy.  Neurological: She is alert and oriented to person, place, and time.  Skin: Skin is warm and dry.  Psychiatric: Her behavior is normal.   Upper extremity measurements were obtained to the location 15 cm superior as well as 10 and 20 cm inferior to the olecranon process.  Left: 31.5, 26, 18 cm. Right: 31, 24.5, 18 cm.  Measurements obtained at the time of the 12/11/2015 exam:  Left: 31, 25, 18 cm.  Right: 30, 25, 18 cm.    Assessment    Doing well status completion whole breast radiation.  Minimal change and upper  extremity measurements warranting further observation.      Plan        Follow up in 2 months with an office visit.  Discussed BRCA and bone density. Cologuard was negative.  This information has been scribed by Karie Fetch RN, BSN,BC.   Robert Bellow 01/08/2016, 8:56 PM

## 2016-01-08 NOTE — Patient Instructions (Addendum)
The patient is aware to call back for any questions or concerns. Follow up in 2 months with an office visit.

## 2016-01-13 ENCOUNTER — Ambulatory Visit
Admission: RE | Admit: 2016-01-13 | Discharge: 2016-01-13 | Disposition: A | Discharge status: Home / Self Care | Payer: No Typology Code available for payment source | Source: Ambulatory Visit | Attending: Hematology and Oncology | Admitting: Hematology and Oncology

## 2016-01-13 DIAGNOSIS — Z13820 Encounter for screening for osteoporosis: Secondary | ICD-10-CM | POA: Diagnosis present

## 2016-01-13 DIAGNOSIS — M85851 Other specified disorders of bone density and structure, right thigh: Secondary | ICD-10-CM | POA: Insufficient documentation

## 2016-01-13 DIAGNOSIS — M8588 Other specified disorders of bone density and structure, other site: Secondary | ICD-10-CM | POA: Diagnosis not present

## 2016-01-13 DIAGNOSIS — C50511 Malignant neoplasm of lower-outer quadrant of right female breast: Secondary | ICD-10-CM

## 2016-01-29 ENCOUNTER — Ambulatory Visit
Admission: RE | Admit: 2016-01-29 | Discharge: 2016-01-29 | Disposition: A | Discharge status: Home / Self Care | Payer: No Typology Code available for payment source | Source: Ambulatory Visit | Attending: Radiation Oncology | Admitting: Radiation Oncology

## 2016-01-29 ENCOUNTER — Encounter: Payer: Self-pay | Admitting: Radiation Oncology

## 2016-01-29 VITALS — BP 123/82 | HR 80 | Temp 97.1°F | Resp 20 | Wt 162.5 lb

## 2016-01-29 DIAGNOSIS — C50511 Malignant neoplasm of lower-outer quadrant of right female breast: Secondary | ICD-10-CM

## 2016-01-29 DIAGNOSIS — Z170 Estrogen receptor positive status [ER+]: Secondary | ICD-10-CM | POA: Diagnosis not present

## 2016-01-29 DIAGNOSIS — C50911 Malignant neoplasm of unspecified site of right female breast: Secondary | ICD-10-CM | POA: Diagnosis present

## 2016-01-29 DIAGNOSIS — Z923 Personal history of irradiation: Secondary | ICD-10-CM | POA: Insufficient documentation

## 2016-01-29 NOTE — Progress Notes (Signed)
Radiation Oncology Follow up Note  Name: Amy Holland   Date:   01/29/2016 MRN:  [id number] DOB: 08/30/1960    This 55 y.o. female presents to the clinic today for one-month follow-up status post whole breast radiation to her right breast for stage I ER/PR positive invasive mammary carcinoma.  REFERRING PROVIDER: Kirk Ruths, MD  HPI: Patient is a 55 year old female now seen out 1 month having completed whole breast radiation to her right breast for a stage I (T1 CN 0 M0) ER/PR positive HER-2/neu negative invasive mammary carcinoma. Seen today in routine follow-up she is doing well. She specifically denies breast tenderness cough or bone pain. She's had prior treatment with lumpectomy to her left breast right breast [place] cosmetically superior..  COMPLICATIONS OF TREATMENT: none  FOLLOW UP COMPLIANCE: keeps appointments   PHYSICAL EXAM:  BP 123/82   Pulse 80   Temp 97.1 F (36.2 C)   Resp 20   Wt 162 lb 7.7 oz (73.7 kg)   LMP  (Approximate) Comment: 2002  BMI 27.04 kg/m  Well-developed female in NAD.Lungs are clear to A&P cardiac examination essentially unremarkable with regular rate and rhythm. No dominant mass or nodularity is noted in either breast in 2 positions examined. Incision is well-healed. No axillary or supraclavicular adenopathy is appreciated. Cosmetic result is excellent. The left breast has been treated prior. Cosmetic result is much superior in the right breast. There is contraction of the scar towards the lateral portion of her breast on the left side.  RADIOLOGY RESULTS: No current films for review  PLAN: Present time patient is doing well she still seeing surgeon tomorrow for discussion of antiestrogen therapy. Otherwise I'm please were overall progress. I've asked to see around 4-5 months for follow-up. She knows to call sooner with any concerns.  I would like to take this opportunity to thank you for allowing me to participate in the care of your  patient.Armstead Peaks., MD

## 2016-01-30 ENCOUNTER — Other Ambulatory Visit: Payer: Self-pay | Admitting: Family Medicine

## 2016-01-31 ENCOUNTER — Inpatient Hospital Stay
Payer: No Typology Code available for payment source | Attending: Hematology and Oncology | Admitting: Hematology and Oncology

## 2016-01-31 ENCOUNTER — Encounter: Payer: Self-pay | Admitting: Hematology and Oncology

## 2016-01-31 VITALS — BP 129/84 | HR 82 | Temp 96.2°F | Resp 18 | Wt 160.8 lb

## 2016-01-31 DIAGNOSIS — C50511 Malignant neoplasm of lower-outer quadrant of right female breast: Secondary | ICD-10-CM | POA: Insufficient documentation

## 2016-01-31 DIAGNOSIS — Z87891 Personal history of nicotine dependence: Secondary | ICD-10-CM | POA: Diagnosis not present

## 2016-01-31 DIAGNOSIS — Z170 Estrogen receptor positive status [ER+]: Secondary | ICD-10-CM | POA: Insufficient documentation

## 2016-01-31 DIAGNOSIS — Z923 Personal history of irradiation: Secondary | ICD-10-CM | POA: Insufficient documentation

## 2016-01-31 DIAGNOSIS — M8580 Other specified disorders of bone density and structure, unspecified site: Secondary | ICD-10-CM | POA: Diagnosis not present

## 2016-01-31 DIAGNOSIS — Z79899 Other long term (current) drug therapy: Secondary | ICD-10-CM | POA: Diagnosis not present

## 2016-01-31 DIAGNOSIS — Z853 Personal history of malignant neoplasm of breast: Secondary | ICD-10-CM | POA: Diagnosis not present

## 2016-01-31 DIAGNOSIS — Z79810 Long term (current) use of selective estrogen receptor modulators (SERMs): Secondary | ICD-10-CM | POA: Insufficient documentation

## 2016-01-31 DIAGNOSIS — F329 Major depressive disorder, single episode, unspecified: Secondary | ICD-10-CM | POA: Diagnosis not present

## 2016-01-31 LAB — CMP12+LP+TP+TSH+6AC+CBC/D/PLT
A/G RATIO: 1.8 (ref 1.2–2.2)
ALT: 32 IU/L (ref 0–32)
AST: 33 IU/L (ref 0–40)
Albumin: 4.6 g/dL (ref 3.5–5.5)
Alkaline Phosphatase: 109 IU/L (ref 39–117)
BASOS ABS: 0.1 10*3/uL (ref 0.0–0.2)
BILIRUBIN TOTAL: 0.6 mg/dL (ref 0.0–1.2)
BUN / CREAT RATIO: 22 (ref 9–23)
BUN: 13 mg/dL (ref 6–24)
Basos: 2 %
CHOLESTEROL TOTAL: 218 mg/dL — AB (ref 100–199)
Calcium: 9.6 mg/dL (ref 8.7–10.2)
Chloride: 100 mmol/L (ref 96–106)
Chol/HDL Ratio: 2.7 ratio units (ref 0.0–4.4)
Creatinine, Ser: 0.58 mg/dL (ref 0.57–1.00)
EOS (ABSOLUTE): 0.2 10*3/uL (ref 0.0–0.4)
EOS: 5 %
Estimated CHD Risk: <0.5 times avg. (ref 0.0–1.0)
FREE THYROXINE INDEX: 1.9 (ref 1.2–4.9)
GFR calc Af Amer: 118 mL/min/{1.73_m2} (ref >59)
GFR calc non Af Amer: 103 mL/min/{1.73_m2} (ref >59)
GGT: 39 IU/L (ref 0–60)
GLUCOSE: 83 mg/dL (ref 65–99)
Globulin, Total: 2.5 g/dL (ref 1.5–4.5)
HDL: 82 mg/dL (ref >39)
HEMOGLOBIN: 14.8 g/dL (ref 11.1–15.9)
Hematocrit: 43.8 % (ref 34.0–46.6)
IMMATURE GRANULOCYTES: 0 %
Immature Grans (Abs): 0 10*3/uL (ref 0.0–0.1)
Iron: 119 ug/dL (ref 27–159)
LDH: 196 IU/L (ref 119–226)
LDL Calculated: 119 mg/dL — ABNORMAL HIGH (ref 0–99)
LYMPHS: 27 %
Lymphocytes Absolute: 0.9 10*3/uL (ref 0.7–3.1)
MCH: 30.8 pg (ref 26.6–33.0)
MCHC: 33.8 g/dL (ref 31.5–35.7)
MCV: 91 fL (ref 79–97)
MONOCYTES: 11 %
Monocytes Absolute: 0.4 10*3/uL (ref 0.1–0.9)
NEUTROS PCT: 55 %
Neutrophils Absolute: 1.8 10*3/uL (ref 1.4–7.0)
PLATELETS: 211 10*3/uL (ref 150–379)
Phosphorus: 2.4 mg/dL — ABNORMAL LOW (ref 2.5–4.5)
Potassium: 4.5 mmol/L (ref 3.5–5.2)
RBC: 4.81 x10E6/uL (ref 3.77–5.28)
RDW: 13.7 % (ref 12.3–15.4)
Sodium: 141 mmol/L (ref 134–144)
T3 UPTAKE RATIO: 25 % (ref 24–39)
T4, Total: 7.5 ug/dL (ref 4.5–12.0)
TRIGLYCERIDES: 85 mg/dL (ref 0–149)
TSH: 2.01 u[IU]/mL (ref 0.450–4.500)
Total Protein: 7.1 g/dL (ref 6.0–8.5)
URIC ACID: 6.1 mg/dL (ref 2.5–7.1)
VLDL CHOLESTEROL CAL: 17 mg/dL (ref 5–40)
WBC: 3.3 10*3/uL — AB (ref 3.4–10.8)

## 2016-01-31 LAB — HGB A1C W/O EAG: Hgb A1c MFr Bld: 6 % — ABNORMAL HIGH (ref 4.8–5.6)

## 2016-01-31 LAB — PLEASE NOTE

## 2016-01-31 MED ORDER — LETROZOLE 2.5 MG PO TABS: 2.5000 mg | ORAL_TABLET | Freq: Every day | ORAL | 2 refills | Status: DC

## 2016-01-31 NOTE — Patient Instructions (Signed)
Denosumab injection  What is this medicine?  DENOSUMAB (den oh sue mab) slows bone breakdown. Prolia is used to treat osteoporosis in women after menopause and in men. Xgeva is used to prevent bone fractures and other bone problems caused by cancer bone metastases. Xgeva is also used to treat giant cell tumor of the bone.  This medicine may be used for other purposes; ask your health care provider or pharmacist if you have questions.  What should I tell my health care provider before I take this medicine?  They need to know if you have any of these conditions:  -dental disease  -eczema  -infection or history of infections  -kidney disease or on dialysis  -low blood calcium or vitamin D  -malabsorption syndrome  -scheduled to have surgery or tooth extraction  -taking medicine that contains denosumab  -thyroid or parathyroid disease  -an unusual reaction to denosumab, other medicines, foods, dyes, or preservatives  -pregnant or trying to get pregnant  -breast-feeding  How should I use this medicine?  This medicine is for injection under the skin. It is given by a health care professional in a hospital or clinic setting.  If you are getting Prolia, a special MedGuide will be given to you by the pharmacist with each prescription and refill. Be sure to read this information carefully each time.  For Prolia, talk to your pediatrician regarding the use of this medicine in children. Special care may be needed. For Xgeva, talk to your pediatrician regarding the use of this medicine in children. While this drug may be prescribed for children as young as 13 years for selected conditions, precautions do apply.  Overdosage: If you think you have taken too much of this medicine contact a poison control center or emergency room at once.  NOTE: This medicine is only for you. Do not share this medicine with others.  What if I miss a dose?  It is important not to miss your dose. Call your doctor or health care professional if you are  unable to keep an appointment.  What may interact with this medicine?  Do not take this medicine with any of the following medications:  -other medicines containing denosumab  This medicine may also interact with the following medications:  -medicines that suppress the immune system  -medicines that treat cancer  -steroid medicines like prednisone or cortisone  This list may not describe all possible interactions. Give your health care provider a list of all the medicines, herbs, non-prescription drugs, or dietary supplements you use. Also tell them if you smoke, drink alcohol, or use illegal drugs. Some items may interact with your medicine.  What should I watch for while using this medicine?  Visit your doctor or health care professional for regular checks on your progress. Your doctor or health care professional may order blood tests and other tests to see how you are doing.  Call your doctor or health care professional if you get a cold or other infection while receiving this medicine. Do not treat yourself. This medicine may decrease your body's ability to fight infection.  You should make sure you get enough calcium and vitamin D while you are taking this medicine, unless your doctor tells you not to. Discuss the foods you eat and the vitamins you take with your health care professional.  See your dentist regularly. Brush and floss your teeth as directed. Before you have any dental work done, tell your dentist you are receiving this medicine.  Do   not become pregnant while taking this medicine or for 5 months after stopping it. Women should inform their doctor if they wish to become pregnant or think they might be pregnant. There is a potential for serious side effects to an unborn child. Talk to your health care professional or pharmacist for more information.  What side effects may I notice from receiving this medicine?  Side effects that you should report to your doctor or health care professional as soon as  possible:  -allergic reactions like skin rash, itching or hives, swelling of the face, lips, or tongue  -breathing problems  -chest pain  -fast, irregular heartbeat  -feeling faint or lightheaded, falls  -fever, chills, or any other sign of infection  -muscle spasms, tightening, or twitches  -numbness or tingling  -skin blisters or bumps, or is dry, peels, or red  -slow healing or unexplained pain in the mouth or jaw  -unusual bleeding or bruising  Side effects that usually do not require medical attention (Report these to your doctor or health care professional if they continue or are bothersome.):  -muscle pain  -stomach upset, gas  This list may not describe all possible side effects. Call your doctor for medical advice about side effects. You may report side effects to FDA at 1-800-FDA-1088.  Where should I keep my medicine?  This medicine is only given in a clinic, doctor's office, or other health care setting and will not be stored at home.  NOTE: This sheet is a summary. It may not cover all possible information. If you have questions about this medicine, talk to your doctor, pharmacist, or health care provider.      2016, Elsevier/Gold Standard. (2011-11-23 12:37:47)

## 2016-01-31 NOTE — Progress Notes (Signed)
[facility] day:  01/31/16  Chief Complaint: Amy Holland is a 55 y.o. female with stage I right breast cancer s/p lumpectomy and radiation (2017), a history of stage I left breast cancer (1994), and a family history of BRCA1 who is seen for review of interval bone density and discussion regarding direction of therapy.  HPI:  The patient was last seen in the medical oncology clinic on 12/23/2015.  At that time, she was seen for initial consultation. MammaPrint testing revealed low risk luminal-type A with a distant rate of recurrence in 5 years of 5%.   She had completed radiation.  We discussed hormonal therapy.  She was overwhelmed with her diagnosis.  She was reluctant to have hormonal therapy as she has been reading about it on the internet.  Information on tamoxifen and aromatase inhibitors was provided.  A bone density study was ordered.  Bone density study on 01/13/2016 revealed osteopenia with a T score of -1.9 in the AP spine L1-L4.  The probability of a major osteoporotic fracture is 14.3% within the next 10 years.  The probability of a hip fracture is 1.7% within the next 10 years.  During the interim, she denies any new complaints.   Past Medical History:  Diagnosis Date  . Breast cancer ([place]) 1994   left lumpectomy with lymph node removal c Radiation and chemo  . Breast cancer of lower-outer quadrant of right female breast ([place]) 09/24/2015   Right breast, 7 o'clock:11 mm, histologic grade 2, T1c, N0; ER/PR positive, HER-2/neu not overexpressed. Negative margins  . Colonoscopy refused    Cologuard negative by patient report.  . Depression   . Headache    migraine    Past Surgical History:  Procedure Laterality Date  . BREAST LUMPECTOMY Left 1994   with lymph node removal Dr Amalia Hailey in [place]   . ECTOPIC PREGNANCY SURGERY  1989  . PARTIAL MASTECTOMY WITH AXILLARY SENTINEL LYMPH NODE BIOPSY Right 09/24/2015   Procedure: PARTIAL  MASTECTOMY WITH AXILLARY SENTINEL LYMPH NODE BIOPSY, WIDE EXCISION, RECONSTRUCTION/MASTOPLASTY;  Surgeon: Robert Bellow, MD;  Location: [facility] ORS;  Service: General;  Laterality: Right;    Family History  Problem Relation Age of Onset  . Breast cancer Sister 45    BRCA positive.  . Breast cancer Sister 47    BRCA negative  . Ovarian cancer Mother   . Breast cancer Sister     BRCA positive    Social History:  reports that she quit smoking about 32 years ago. She has never used smokeless tobacco. She reports that she drinks alcohol. She reports that she does not use drugs.  She lived in [place] for 32 years.  She has lived in [place] for 7 years.  She has 3 children (2 boys and 1 girl).  Her daughter is 72 years old.  She works at [employer].  The patient is alone today.  Allergies:  Allergies  Allergen Reactions  . Latex Rash    Current Medications: Current Outpatient Prescriptions  Medication Sig Dispense Refill  . cetirizine (ZYRTEC) 10 MG tablet Take 10 mg by mouth daily.    . cyclobenzaprine (FLEXERIL) 10 MG tablet Take by mouth 3 (three) times daily as needed.     Marland Kitchen ibuprofen (GOODSENSE IBUPROFEN) 200 MG tablet Take by mouth every 6 (six) hours as needed.     . Multiple Vitamin (MULTIVITAMIN) tablet Take 1 tablet by mouth daily.    . SUMAtriptan (IMITREX)  50 MG tablet Take 50 mg by mouth every 2 (two) hours as needed for migraine. May repeat in 2 hours if headache persists or recurs.     No current facility-administered medications for this visit.     Review of Systems:  GENERAL:  Feels good.  No fevers or sweats.  Weight loss of 2 pounds. PERFORMANCE STATUS (ECOG):  1 HEENT:  No visual changes, runny nose, sore throat, mouth sores or tenderness. Lungs: No shortness of breath or cough.  No hemoptysis. Cardiac:  No chest pain, palpitations, orthopnea, or PND. GI:  Appetite 75%.  No nausea, vomiting, diarrhea, constipation, melena or hematochezia. GU:  No  urgency, frequency, dysuria, or hematuria. Musculoskeletal:  No back pain.  No joint pain.  No muscle tenderness. Extremities:  No pain or swelling. Skin:  No rashes, ulcers or skin changes. Neuro:  No headache, numbness or weakness, balance or coordination issues. Endocrine:  No diabetes, thyroid issues, hot flashes or night sweats. Psych:  No depression. Pain:  No focal pain. Review of systems:  All other systems reviewed and found to be negative.  Physical Exam: Blood pressure 129/84, pulse 82, temperature (!) 96.2 F (35.7 C), temperature source Tympanic, resp. rate 18, weight 160 lb 13.2 oz (73 kg). GENERAL:  Well developed, well nourished, woman sitting comfortably in the exam room in no acute distress.  MENTAL STATUS:  Alert and oriented to person, place and time. HEAD:  Shoulder length curly brown hair.  Normocephalic, atraumatic, face symmetric, no Cushingoid features. EYES:  Hazel/green eyes.  No conjunctivitis or scleral icterus. NEUROLOGICAL: Unremarkable. PSYCH:  Appropriate.   No visits with results within 3 Day(s) from this visit.  Latest known visit with results is:  Appointment on 12/11/2015  Component Date Value Ref Range Status  . WBC 12/11/2015 4.8  3.6 - 11.0 K/uL Final  . RBC 12/11/2015 4.67  3.80 - 5.20 MIL/uL Final  . Hemoglobin 12/11/2015 14.4  12.0 - 16.0 g/dL Final  . HCT 12/11/2015 42.3  35.0 - 47.0 % Final  . MCV 12/11/2015 90.4  80.0 - 100.0 fL Final  . MCH 12/11/2015 30.9  26.0 - 34.0 pg Final  . MCHC 12/11/2015 34.1  32.0 - 36.0 g/dL Final  . RDW 12/11/2015 13.2  11.5 - 14.5 % Final  . Platelets 12/11/2015 175  150 - 440 K/uL Final    Assessment:  Amy Holland is a 55 y.o. female with a history of bilateral breast cancer and a family history of BRCA1 mutation.  She was diagnosed with stage I left breast cancer in 67 (age of 7).  No pathology is available (patient reports hormone receptor negative).  She underwent lumpectomy, followed by 5 cycles  of Adriamycin and Cytoxan (AC) and radiation.    She has stage IA right breast cancer s/p wide excision and sentinel lymph node biopsy on 09/24/2015.  Pathology revealed a 1.1 cm grade II invasive mammary carcinoma.  There was no lymphovascular invasion.  Margins were negative.  Two lymph nodes were negative.  ER was > 90%, PR 11-50%, and Her2/neu 1+.  Pathologic stage was T1cN0Mx.  She received 50.4 Gy from 10/28/2015 - 12/05/2015 to the right breast with a 14.4 Gy scar boost from 12/06/2015 - 12/18/2015.    MammaPrint on 09/24/2015 revealed low risk luminal-type A with a distant rate of recurrence in 5 years of 5% (CI: 1-9%) and in 10 years of 10% (CI: 4-15%).    Family history is notable for 2 sisters with  breast cancer (age 62 and 31).  Two sisters are BRCA1 positive (sister with breast cancer at age 25 and another sister without cancer).  She has declined BRCA1 testing.  Symptomatically, she is feeling overwhelmed with her diagnosis.  Exam reveals post-operative and post radiation changes.  Plan: 1.  Discuss patient's thoughts about hormonal therapy.  Discuss tamoxifen versus aromatase inhibitors (AIs).  Discuss side effects.  Discuss arthralgias, vasomotor symptoms, and potential bone loss with AIs.  Patient concerned about increased cholesterol (3-52%).  Discuss risk of thrombosis, endometrial cancer, and weight gain on tamoxifen.  Patient not interested in tamoxifen based on her sister's side effects (weight gain) and improved reponse with AIs.  2.  Discuss bone density study and management of osteopenia.  Discuss calcium 1200 mg a day and vitamin D 800 IU a day.  Discuss activity level.  Discuss consideration of a bisphosphonate or Prolia.  Discuss risk of fracture based on FRAX.  Side effects were reviewed in detail.  Information provided. 3.  Obtain copy of labs recently drawn at wellness check re: aromatase inhibitor. 4.  Rx:  Femara 2.5 mg po q day; dis: #30, 2 refills. 5.  RTC in 1  month for MD assessment and labs (LFTs).   Lequita Asal, MD  01/31/2016, 10:38 AM

## 2016-01-31 NOTE — Progress Notes (Signed)
Patient is here for follow up, she has no complaints today.  

## 2016-02-01 ENCOUNTER — Encounter: Payer: Self-pay | Admitting: Hematology and Oncology

## 2016-02-01 NOTE — Progress Notes (Signed)
Re:  [facility] labs   Labs from 01/30/2016 revealed a hematocrit of 43.8, hemoglobin 14.8, platelets 211,000, white count 3300 with an ANC of 1800. Comprehensive metabolic panel included a calcium of 9.6. Liver function tests were normal. Cholesterol was 218, triglycerides 85, HDL 82, VDRL 117 and LDL 119.   Thyroid function studies were normal. HgbA1c was 6%.  Lequita Asal, MD

## 2016-02-03 ENCOUNTER — Telehealth: Payer: Self-pay | Admitting: *Deleted

## 2016-02-26 ENCOUNTER — Other Ambulatory Visit: Payer: Self-pay | Admitting: *Deleted

## 2016-02-26 DIAGNOSIS — Z853 Personal history of malignant neoplasm of breast: Secondary | ICD-10-CM

## 2016-02-28 ENCOUNTER — Encounter: Payer: Self-pay | Admitting: Hematology and Oncology

## 2016-02-28 ENCOUNTER — Inpatient Hospital Stay
Payer: No Typology Code available for payment source | Attending: Hematology and Oncology | Admitting: Hematology and Oncology

## 2016-02-28 ENCOUNTER — Inpatient Hospital Stay: Payer: No Typology Code available for payment source

## 2016-02-28 ENCOUNTER — Other Ambulatory Visit: Payer: Self-pay | Admitting: *Deleted

## 2016-02-28 ENCOUNTER — Other Ambulatory Visit: Payer: Self-pay

## 2016-02-28 VITALS — BP 151/90 | HR 80 | Temp 97.3°F | Ht 64.0 in | Wt 162.0 lb

## 2016-02-28 DIAGNOSIS — Z853 Personal history of malignant neoplasm of breast: Secondary | ICD-10-CM

## 2016-02-28 DIAGNOSIS — Z803 Family history of malignant neoplasm of breast: Secondary | ICD-10-CM | POA: Diagnosis not present

## 2016-02-28 DIAGNOSIS — M8580 Other specified disorders of bone density and structure, unspecified site: Secondary | ICD-10-CM

## 2016-02-28 DIAGNOSIS — Z87891 Personal history of nicotine dependence: Secondary | ICD-10-CM | POA: Diagnosis not present

## 2016-02-28 DIAGNOSIS — C50511 Malignant neoplasm of lower-outer quadrant of right female breast: Secondary | ICD-10-CM | POA: Insufficient documentation

## 2016-02-28 DIAGNOSIS — Z79811 Long term (current) use of aromatase inhibitors: Secondary | ICD-10-CM | POA: Insufficient documentation

## 2016-02-28 DIAGNOSIS — Z923 Personal history of irradiation: Secondary | ICD-10-CM | POA: Diagnosis not present

## 2016-02-28 DIAGNOSIS — Z170 Estrogen receptor positive status [ER+]: Secondary | ICD-10-CM | POA: Diagnosis not present

## 2016-02-28 DIAGNOSIS — Z8481 Family history of carrier of genetic disease: Secondary | ICD-10-CM

## 2016-02-28 LAB — HEPATIC FUNCTION PANEL
ALT: 37 U/L (ref 14–54)
AST: 37 U/L (ref 15–41)
Albumin: 4.6 g/dL (ref 3.5–5.0)
Alkaline Phosphatase: 98 U/L (ref 38–126)
Bilirubin, Direct: 0.1 mg/dL (ref 0.1–0.5)
Indirect Bilirubin: 0.7 mg/dL (ref 0.3–0.9)
Total Bilirubin: 0.8 mg/dL (ref 0.3–1.2)
Total Protein: 8.1 g/dL (ref 6.5–8.1)

## 2016-02-28 NOTE — Progress Notes (Signed)
[facility] day:  02/28/16  Chief Complaint: Amy Holland is a 55 y.o. female with stage I right breast cancer s/p lumpectomy and radiation (2017), a history of stage I left breast cancer (1994), and a family history of BRCA1 who is seen for 1 month assessment on Femara.  HPI:  The patient was last seen in the medical oncology clinic on 01/31/2016.  At that time, bone density study was reviewed.  She had osteopenia with a T score of -1.9 in the AP spine.  We discussed calcium and vitamin D.  We discussed tamoxifen versus aromatase inhibitors.  After some discussion, decision was made to pursue aromatase inhibitors.  She was given a prescription for Femara.  She states that she started the Femara on 02/03/2016. She has noted some hot flashes.  She has a little trouble sleeping and wakes up in the middle of the night. She sees her dentist on Monday, 03/02/2016.  Blood pressure has been more elevated. She notes her blood pressure has been a little higher (140/80 at the wellness program).    Past Medical History:  Diagnosis Date  . Breast cancer ([place]) 1994   left lumpectomy with lymph node removal c Radiation and chemo  . Breast cancer of lower-outer quadrant of right female breast ([place]) 09/24/2015   Right breast, 7 o'clock:11 mm, histologic grade 2, T1c, N0; ER/PR positive, HER-2/neu not overexpressed. Negative margins  . Colonoscopy refused    Cologuard negative by patient report.  . Depression   . Headache    migraine    Past Surgical History:  Procedure Laterality Date  . BREAST LUMPECTOMY Left 1994   with lymph node removal Dr Amalia Hailey in [place]   . ECTOPIC PREGNANCY SURGERY  1989  . PARTIAL MASTECTOMY WITH AXILLARY SENTINEL LYMPH NODE BIOPSY Right 09/24/2015   Procedure: PARTIAL MASTECTOMY WITH AXILLARY SENTINEL LYMPH NODE BIOPSY, WIDE EXCISION, RECONSTRUCTION/MASTOPLASTY;  Surgeon: Robert Bellow, MD;  Location: [facility] ORS;  Service:  General;  Laterality: Right;    Family History  Problem Relation Age of Onset  . Breast cancer Sister 80    BRCA positive.  . Breast cancer Sister 53    BRCA negative  . Ovarian cancer Mother   . Breast cancer Sister     BRCA positive    Social History:  reports that she quit smoking about 32 years ago. She has never used smokeless tobacco. She reports that she drinks alcohol. She reports that she does not use drugs.  She lived in [place] for 32 years.  She has lived in [place] for 7 years.  She has 3 children (2 boys and 1 girl).  Her daughter is 74 years old.  She works at [employer].  The patient is alone today.  Allergies:  Allergies  Allergen Reactions  . Latex Rash    Current Medications: Current Outpatient Prescriptions  Medication Sig Dispense Refill  . cetirizine (ZYRTEC) 10 MG tablet Take 10 mg by mouth daily.    Marland Kitchen ibuprofen (GOODSENSE IBUPROFEN) 200 MG tablet Take by mouth every 6 (six) hours as needed.     Marland Kitchen letrozole (FEMARA) 2.5 MG tablet Take 1 tablet (2.5 mg total) by mouth daily. 30 tablet 2  . Multiple Vitamin (MULTIVITAMIN) tablet Take 1 tablet by mouth daily.    . SUMAtriptan (IMITREX) 50 MG tablet Take 50 mg by mouth every 2 (two) hours as needed for migraine. May repeat in 2 hours if headache persists or  recurs.    . cyclobenzaprine (FLEXERIL) 10 MG tablet Take by mouth 3 (three) times daily as needed.      No current facility-administered medications for this visit.     Review of Systems:  GENERAL:  Feels good.  No fevers or sweats.  Weight loss of 2 pounds. PERFORMANCE STATUS (ECOG):  1 HEENT:  No visual changes, runny nose, sore throat, mouth sores or tenderness. Lungs: No shortness of breath or cough.  No hemoptysis. Cardiac:  Blood pressure higher.  No chest pain, palpitations, orthopnea, or PND. GI:  No nausea, vomiting, diarrhea, constipation, melena or hematochezia. GU:  No urgency, frequency, dysuria, or hematuria. Musculoskeletal:   No back pain.  No joint pain.  No muscle tenderness. Extremities:  No pain or swelling. Skin:  No rashes, ulcers or skin changes. Neuro:  No headache, numbness or weakness, balance or coordination issues. Endocrine:  Few hot flashes.  No diabetes, thyroid issues, or night sweats. Psych:  No depression. Pain:  No focal pain. Review of systems:  All other systems reviewed and found to be negative.  Physical Exam: Blood pressure (!) 151/90, pulse 80, temperature 97.3 F (36.3 C), temperature source Tympanic, height 5' 4"  (1.626 m), weight 162 lb 0.6 oz (73.5 kg). GENERAL:  Well developed, well nourished, woman sitting comfortably in the exam room in no acute distress.  MENTAL STATUS:  Alert and oriented to person, place and time. HEAD:  Shoulder length curly brown hair.  Normocephalic, atraumatic, face symmetric, no Cushingoid features. EYES:  Hazel/green eyes.  Pupils equal round and reactive to light and accomodation.  No conjunctivitis or scleral icterus. ENT:  Oropharynx clear without lesion.  Tongue normal. Mucous membranes moist.  RESPIRATORY:  Clear to auscultation without rales, wheezes or rhonchi. CARDIOVASCULAR:  Regular rate and rhythm without murmur, rub or gallop. ABDOMEN:  Soft, non-tender, with active bowel sounds, and no hepatosplenomegaly.  No masses. SKIN:  No rashes or bruises. EXTREMITIES: No edema, no skin discoloration or tenderness.  No palpable cords. LYMPH NODES: No palpable cervical, supraclavicular, axillary or inguinal adenopathy  NEUROLOGICAL: Unremarkable. PSYCH:  Appropriate.   No visits with results within 3 Day(s) from this visit.  Latest known visit with results is:  Orders Only on 01/30/2016  Component Date Value Ref Range Status  . Glucose 01/31/2016 83  65 - 99 mg/dL Final  . Uric Acid 01/31/2016 6.1  2.5 - 7.1 mg/dL Final  . BUN 01/31/2016 13  6 - 24 mg/dL Final  . Creatinine, Ser 01/31/2016 0.58  0.57 - 1.00 mg/dL Final  . GFR calc non Af Amer  01/31/2016 103  >59 mL/min/1.73 Final  . GFR calc Af Amer 01/31/2016 118  >59 mL/min/1.73 Final  . BUN/Creatinine Ratio 01/31/2016 22  9 - 23 Final  . Sodium 01/31/2016 141  134 - 144 mmol/L Final  . Potassium 01/31/2016 4.5  3.5 - 5.2 mmol/L Final  . Chloride 01/31/2016 100  96 - 106 mmol/L Final  . Calcium 01/31/2016 9.6  8.7 - 10.2 mg/dL Final  . Phosphorus 01/31/2016 2.4* 2.5 - 4.5 mg/dL Final  . Total Protein 01/31/2016 7.1  6.0 - 8.5 g/dL Final  . Albumin 01/31/2016 4.6  3.5 - 5.5 g/dL Final  . Globulin, Total 01/31/2016 2.5  1.5 - 4.5 g/dL Final  . Albumin/Globulin Ratio 01/31/2016 1.8  1.2 - 2.2 Final  . Bilirubin Total 01/31/2016 0.6  0.0 - 1.2 mg/dL Final  . Alkaline Phosphatase 01/31/2016 109  39 - 117 IU/L Final  .  LDH 01/31/2016 196  119 - 226 IU/L Final  . AST 01/31/2016 33  0 - 40 IU/L Final  . ALT 01/31/2016 32  0 - 32 IU/L Final  . GGT 01/31/2016 39  0 - 60 IU/L Final  . Iron 01/31/2016 119  27 - 159 ug/dL Final  . Cholesterol, Total 01/31/2016 218* 100 - 199 mg/dL Final  . Triglycerides 01/31/2016 85  0 - 149 mg/dL Final  . HDL 01/31/2016 82  >39 mg/dL Final  . VLDL Cholesterol Cal 01/31/2016 17  5 - 40 mg/dL Final  . LDL Calculated 01/31/2016 119* 0 - 99 mg/dL Final  . Chol/HDL Ratio 01/31/2016 2.7  0.0 - 4.4 ratio units Final   Comment:                                   T. Chol/HDL Ratio                                             Men  Women                               1/2 Avg.Risk  3.4    3.3                                   Avg.Risk  5.0    4.4                                2X Avg.Risk  9.6    7.1                                3X Avg.Risk 23.4   11.0   . Estimated CHD Risk 01/31/2016  < 0.5  0.0 - 1.0  times avg. Final   Comment:                                   T. Chol/HDL Ratio                                             Men  Women                               1/2 Avg.Risk  3.4    3.3                                   Avg.Risk  5.0    4.4                                 2X Avg.Risk  9.6    7.1  3X Avg.Risk 23.4   11.0 The CHD Risk is based on the T. Chol/HDL ratio.  Other factors affect CHD Risk such as hypertension, smoking, diabetes, severe obesity, and family history of pre- mature CHD.   . TSH 01/31/2016 2.010  0.450 - 4.500 uIU/mL Final  . T4, Total 01/31/2016 7.5  4.5 - 12.0 ug/dL Final  . T3 Uptake Ratio 01/31/2016 25  24 - 39 % Final  . Free Thyroxine Index 01/31/2016 1.9  1.2 - 4.9 Final  . WBC 01/31/2016 3.3* 3.4 - 10.8 x10E3/uL Final  . RBC 01/31/2016 4.81  3.77 - 5.28 x10E6/uL Final  . Hemoglobin 01/31/2016 14.8  11.1 - 15.9 g/dL Final  . Hematocrit 01/31/2016 43.8  34.0 - 46.6 % Final  . MCV 01/31/2016 91  79 - 97 fL Final  . MCH 01/31/2016 30.8  26.6 - 33.0 pg Final  . MCHC 01/31/2016 33.8  31.5 - 35.7 g/dL Final  . RDW 01/31/2016 13.7  12.3 - 15.4 % Final  . Platelets 01/31/2016 211  150 - 379 x10E3/uL Final  . Neutrophils 01/31/2016 55  % Final  . Lymphs 01/31/2016 27  % Final  . Monocytes 01/31/2016 11  % Final  . Eos 01/31/2016 5  % Final  . Basos 01/31/2016 2  % Final  . Neutrophils Absolute 01/31/2016 1.8  1.4 - 7.0 x10E3/uL Final  . Lymphocytes Absolute 01/31/2016 0.9  0.7 - 3.1 x10E3/uL Final  . Monocytes Absolute 01/31/2016 0.4  0.1 - 0.9 x10E3/uL Final  . EOS (ABSOLUTE) 01/31/2016 0.2  0.0 - 0.4 x10E3/uL Final  . Basophils Absolute 01/31/2016 0.1  0.0 - 0.2 x10E3/uL Final  . Immature Granulocytes 01/31/2016 0  % Final  . Immature Grans (Abs) 01/31/2016 0.0  0.0 - 0.1 x10E3/uL Final  . Hgb A1c MFr Bld 01/31/2016 6.0* 4.8 - 5.6 % Final   Comment:          Pre-diabetes: 5.7 - 6.4          Diabetes: >6.4          Glycemic control for adults with diabetes: <7.0   . Please note 01/31/2016 Comment   Final   Comment: The date and/or time of collection was not indicated on the requisition as required by state and federal law.  The date of receipt of the specimen was used as the  collection date if not supplied.     Assessment:  Amy Holland is a 55 y.o. female with a history of bilateral breast cancer and a family history of BRCA1 mutation.  She was diagnosed with stage I left breast cancer in 40 (age of 3).  No pathology is available (patient reports hormone receptor negative).  She underwent lumpectomy, followed by 5 cycles of Adriamycin and Cytoxan (AC) and radiation.    She has stage IA right breast cancer s/p wide excision and sentinel lymph node biopsy on 09/24/2015.  Pathology revealed a 1.1 cm grade II invasive mammary carcinoma.  There was no lymphovascular invasion.  Margins were negative.  Two lymph nodes were negative.  ER was > 90%, PR 11-50%, and Her2/neu 1+.  Pathologic stage was [id number].  She received 50.4 Gy from 10/28/2015 - 12/05/2015 to the right breast with a 14.4 Gy scar boost from 12/06/2015 - 12/18/2015.    MammaPrint on 09/24/2015 revealed low risk luminal-type A with a distant rate of recurrence in 5 years of 5% (CI: 1-9%) and in 10 years of 10% (CI: 4-15%).    She began  Femara on 02/03/2016.  She is tolerating it well with a few hot flashes.  Family history is notable for 2 sisters with breast cancer (age 38 and 74).  Two sisters are BRCA1 positive (sister with breast cancer at age 70 and another sister without cancer).  She has declined BRCA1 testing.  Symptomatically, she is doing well.  Blood pressure has been a little elevated.  Exam is stable.  Plan: 1.  Labs today: LFTs. 2.  Continue Femara. 3.  Discuss 5-8% incidence of hypertension associated with Femara.  Possible white coat hypertension.  Encourage follow-up blood pressure check and appointment with Dr. Frazier Richards if remains elevated. 4.  Follow-up with dentist on 03/02/2016. 5.  RTC in 3 months for MD assessment and labs (CBC with diff, CMP, CA27.29).   Lequita Asal, MD  02/28/2016, 9:42 AM

## 2016-02-28 NOTE — Progress Notes (Signed)
Patient here for follow up. Having shooting pain is rt breast.

## 2016-03-17 ENCOUNTER — Ambulatory Visit (INDEPENDENT_AMBULATORY_CARE_PROVIDER_SITE_OTHER): Payer: PRIVATE HEALTH INSURANCE | Admitting: General Surgery

## 2016-03-17 ENCOUNTER — Encounter: Payer: Self-pay | Admitting: General Surgery

## 2016-03-17 VITALS — BP 132/78 | HR 64 | Resp 16 | Ht 64.0 in | Wt 162.0 lb

## 2016-03-17 DIAGNOSIS — C50511 Malignant neoplasm of lower-outer quadrant of right female breast: Secondary | ICD-10-CM | POA: Diagnosis not present

## 2016-03-17 NOTE — Progress Notes (Signed)
Patient ID: Amy Holland, female   DOB: Nov 05, 1960, 55 y.o.   MRN: [id number]  Chief Complaint  Patient presents with  . Breast Cancer    HPI Amy Holland is a 55 y.o. female here today for her follow up breast cancer check. Patient states she is doing well. She states she is tolerating the Femara.  She does have soreness around the nipple area.  HPI  Past Medical History:  Diagnosis Date  . Breast cancer ([place]) 1994   left lumpectomy with lymph node removal c Radiation and chemo  . Breast cancer of lower-outer quadrant of right female breast ([place]) 09/24/2015   Right breast, 7 o'clock:11 mm, histologic grade 2, T1c, N0; ER/PR positive, HER-2/neu not overexpressed. Negative margins  . Colonoscopy refused    Cologuard negative by patient report.  . Depression   . Headache    migraine    Past Surgical History:  Procedure Laterality Date  . BREAST LUMPECTOMY Left 1994   with lymph node removal Dr Amalia Hailey in [place]   . ECTOPIC PREGNANCY SURGERY  1989  . PARTIAL MASTECTOMY WITH AXILLARY SENTINEL LYMPH NODE BIOPSY Right 09/24/2015   Procedure: PARTIAL MASTECTOMY WITH AXILLARY SENTINEL LYMPH NODE BIOPSY, WIDE EXCISION, RECONSTRUCTION/MASTOPLASTY;  Surgeon: Robert Bellow, MD;  Location: [facility] ORS;  Service: General;  Laterality: Right;    Family History  Problem Relation Age of Onset  . Breast cancer Sister 47    BRCA positive.  . Breast cancer Sister 28    BRCA negative  . Ovarian cancer Mother   . Breast cancer Sister     BRCA positive    Social History Social History  Substance Use Topics  . Smoking status: Former Smoker    Quit date: 06/19/1983  . Smokeless tobacco: Never Used  . Alcohol use 0.0 oz/week     Comment: 2 drinks per day    Allergies  Allergen Reactions  . Latex Rash    Current Outpatient Prescriptions  Medication Sig Dispense Refill  . cetirizine (ZYRTEC) 10 MG tablet Take 10 mg by mouth daily.    . cyclobenzaprine (FLEXERIL) 10 MG tablet Take by  mouth 3 (three) times daily as needed.     Marland Kitchen ibuprofen (GOODSENSE IBUPROFEN) 200 MG tablet Take by mouth every 6 (six) hours as needed.     Marland Kitchen letrozole (FEMARA) 2.5 MG tablet Take 1 tablet (2.5 mg total) by mouth daily. 30 tablet 2  . Multiple Vitamin (MULTIVITAMIN) tablet Take 1 tablet by mouth daily.    . SUMAtriptan (IMITREX) 50 MG tablet Take 50 mg by mouth every 2 (two) hours as needed for migraine. May repeat in 2 hours if headache persists or recurs.     No current facility-administered medications for this visit.     Review of Systems Review of Systems  Constitutional: Negative.   Respiratory: Negative.   Cardiovascular: Negative.     Blood pressure 132/78, pulse 64, resp. rate 16, height _0  (1.626 m), weight 162 lb (73.5 kg).  Physical Exam Physical Exam  Constitutional: She is oriented to person, place, and time. She appears well-developed and well-nourished.  HENT:  Mouth/Throat: Oropharynx is clear and moist.  Eyes: Conjunctivae are normal. No scleral icterus.  Neck: Neck supple.  Cardiovascular: Normal rate, regular rhythm and normal heart sounds.   Pulmonary/Chest: Effort normal and breath sounds normal. Right breast exhibits no inverted nipple, no mass, no nipple discharge, no skin change and no tenderness. Left breast exhibits no inverted nipple, no mass, no  nipple discharge, no skin change and no tenderness.    thickening right breast incision.   Abdominal: Soft. Bowel sounds are normal. There is no tenderness.  Lymphadenopathy:    She has no cervical adenopathy.    She has no axillary adenopathy.  Neurological: She is alert and oriented to person, place, and time.  Skin: Skin is warm and dry.  Psychiatric: Her behavior is normal.    Data Reviewed Patient has declined genetic testing as of this time.  Assessment    Stable breast exam.    Plan        Continue Femara.  Patient to have a bilateral diagnostic mammogram follow up in 6  months.   This information has been scribed by Karie Fetch RN, BSN,BC.   Robert Bellow 03/17/2016, 9:21 PM

## 2016-03-17 NOTE — Patient Instructions (Addendum)
The patient is aware to call back for any questions or concerns. Patient to have a bilateral diagnostic mammogram follow up in 6 months.  

## 2016-04-03 ENCOUNTER — Encounter: Payer: Self-pay | Admitting: *Deleted

## 2016-04-26 ENCOUNTER — Other Ambulatory Visit: Payer: Self-pay | Admitting: Hematology and Oncology

## 2016-05-29 ENCOUNTER — Other Ambulatory Visit: Payer: No Typology Code available for payment source

## 2016-05-29 ENCOUNTER — Ambulatory Visit: Payer: No Typology Code available for payment source

## 2016-06-09 ENCOUNTER — Inpatient Hospital Stay: Payer: No Typology Code available for payment source

## 2016-06-09 ENCOUNTER — Other Ambulatory Visit: Payer: Self-pay

## 2016-06-09 ENCOUNTER — Inpatient Hospital Stay: Payer: No Typology Code available for payment source | Attending: Hematology and Oncology | Admitting: Oncology

## 2016-06-09 ENCOUNTER — Encounter: Payer: Self-pay | Admitting: Oncology

## 2016-06-09 VITALS — BP 162/97 | HR 84 | Temp 98.7°F | Resp 18 | Ht 64.0 in | Wt 151.9 lb

## 2016-06-09 DIAGNOSIS — C50511 Malignant neoplasm of lower-outer quadrant of right female breast: Secondary | ICD-10-CM

## 2016-06-09 DIAGNOSIS — F329 Major depressive disorder, single episode, unspecified: Secondary | ICD-10-CM | POA: Diagnosis not present

## 2016-06-09 DIAGNOSIS — M8580 Other specified disorders of bone density and structure, unspecified site: Secondary | ICD-10-CM

## 2016-06-09 DIAGNOSIS — I10 Essential (primary) hypertension: Secondary | ICD-10-CM

## 2016-06-09 DIAGNOSIS — Z9221 Personal history of antineoplastic chemotherapy: Secondary | ICD-10-CM | POA: Diagnosis not present

## 2016-06-09 DIAGNOSIS — Z87891 Personal history of nicotine dependence: Secondary | ICD-10-CM | POA: Diagnosis not present

## 2016-06-09 DIAGNOSIS — Z79899 Other long term (current) drug therapy: Secondary | ICD-10-CM | POA: Diagnosis not present

## 2016-06-09 DIAGNOSIS — Z170 Estrogen receptor positive status [ER+]: Secondary | ICD-10-CM

## 2016-06-09 DIAGNOSIS — Z853 Personal history of malignant neoplasm of breast: Secondary | ICD-10-CM

## 2016-06-09 DIAGNOSIS — Z923 Personal history of irradiation: Secondary | ICD-10-CM | POA: Diagnosis not present

## 2016-06-09 LAB — CBC WITH DIFFERENTIAL/PLATELET
Basophils Absolute: 0.1 10*3/uL (ref 0–0.1)
Basophils Relative: 1 %
Eosinophils Absolute: 0.1 10*3/uL (ref 0–0.7)
Eosinophils Relative: 3 %
HCT: 45.2 % (ref 35.0–47.0)
Hemoglobin: 15.6 g/dL (ref 12.0–16.0)
Lymphocytes Relative: 16 %
Lymphs Abs: 0.7 10*3/uL — ABNORMAL LOW (ref 1.0–3.6)
MCH: 31.1 pg (ref 26.0–34.0)
MCHC: 34.6 g/dL (ref 32.0–36.0)
MCV: 90 fL (ref 80.0–100.0)
Monocytes Absolute: 0.3 10*3/uL (ref 0.2–0.9)
Monocytes Relative: 8 %
Neutro Abs: 3.1 10*3/uL (ref 1.4–6.5)
Neutrophils Relative %: 72 %
Platelets: 197 10*3/uL (ref 150–440)
RBC: 5.02 MIL/uL (ref 3.80–5.20)
RDW: 13 % (ref 11.5–14.5)
WBC: 4.3 10*3/uL (ref 3.6–11.0)

## 2016-06-09 LAB — COMPREHENSIVE METABOLIC PANEL
ALT: 42 U/L (ref 14–54)
AST: 38 U/L (ref 15–41)
Albumin: 4.6 g/dL (ref 3.5–5.0)
Alkaline Phosphatase: 77 U/L (ref 38–126)
Anion gap: 7 (ref 5–15)
BUN: 15 mg/dL (ref 6–20)
CO2: 26 mmol/L (ref 22–32)
Calcium: 10.4 mg/dL — ABNORMAL HIGH (ref 8.9–10.3)
Chloride: 105 mmol/L (ref 101–111)
Creatinine, Ser: 0.75 mg/dL (ref 0.44–1.00)
GFR calc Af Amer: >60 mL/min (ref >60)
GFR calc non Af Amer: >60 mL/min (ref >60)
Glucose, Bld: 121 mg/dL — ABNORMAL HIGH (ref 65–99)
Potassium: 3.8 mmol/L (ref 3.5–5.1)
Sodium: 138 mmol/L (ref 135–145)
Total Bilirubin: 0.7 mg/dL (ref 0.3–1.2)
Total Protein: 7.9 g/dL (ref 6.5–8.1)

## 2016-06-09 NOTE — Progress Notes (Signed)
Pt having problems with hot flahes all the time, she has suffered loss of family in [place] and oct. Not sure if depression is playing a role from AI or not, trouble sleeping, leg cramps at night. She is contemplating stopping AI

## 2016-06-09 NOTE — Progress Notes (Signed)
Hematology/Oncology Consult note [facility]  Telephone:([phone] [phone]  Patient Care Team: Kirk Ruths, MD as PCP - General (Internal Medicine) Kirk Ruths, MD (Internal Medicine) Robert Bellow, MD (General Surgery)   Name of the patient: Amy Holland  [id number]  09-26-1960   Date of visit: 06/09/16  Diagnosis- stage IA right breast cancer  Chief complaint/ Reason for visit- routine follow-up of breast cancer  Heme/Onc history:  Amy Holland is a 56 y.o. female with a history of bilateral breast cancer and a family history of BRCA1 mutation.  She was diagnosed with stage I left breast cancer in 66 (age of 80).  No pathology is available (patient reports hormone receptor negative).  She underwent lumpectomy, followed by 5 cycles of Adriamycin and Cytoxan (AC) and radiation.    She had recurrent  stage IA right breast cancer s/p wide excision and sentinel lymph node biopsy on 09/24/2015.  Pathology revealed a 1.1 cm grade II invasive mammary carcinoma.  There was no lymphovascular invasion.  Margins were negative.  Two lymph nodes were negative.  ER was > 90%, PR 11-50%, and Her2/neu 1+.  Pathologic stage was [id number].  She received 50.4 Gy from 10/28/2015 - 12/05/2015 to the right breast with a 14.4 Gy scar boost from 12/06/2015 - 12/18/2015.    MammaPrint on 09/24/2015 revealed low risk luminal-type A with a distant rate of recurrence in 5 years of 5% (CI: 1-9%) and in 10 years of 10% (CI: 4-15%).    She began Femara on 02/03/2016. Patient was noted to have osteopenia with a T score of -1.9 at the lumbar spine. 10 year probability of a major osteoporotic fracture was 14.3% and hip fracture was 1.7%  Interval history- patient has had a rough time tolerating her Femara so far. Reports constant hot flashes as well as fluctuations in her mood. She feels depressed at times and is tearful. Denies any suicidal ideations. Also  has been having some trouble with Sleep. She is unsure if this is from her medication versus personal stressors including loss of multiple family members recently  Review of systems- Review of Systems  Constitutional: Positive for malaise/fatigue. Negative for chills, fever and weight loss.  HENT: Negative for congestion, ear discharge and nosebleeds.   Eyes: Negative for blurred vision.  Respiratory: Negative for cough, hemoptysis, sputum production, shortness of breath and wheezing.   Cardiovascular: Negative for chest pain, palpitations, orthopnea and claudication.  Gastrointestinal: Negative for abdominal pain, blood in stool, constipation, diarrhea, heartburn, melena, nausea and vomiting.  Genitourinary: Negative for dysuria, flank pain, frequency, hematuria and urgency.  Musculoskeletal: Negative for back pain, joint pain and myalgias.  Skin: Negative for rash.  Neurological: Negative for dizziness, tingling, focal weakness, seizures, weakness and headaches.  Endo/Heme/Allergies: Does not bruise/bleed easily.  Psychiatric/Behavioral: Positive for depression. Negative for suicidal ideas. The patient has insomnia.      Current treatment- Femara  Allergies  Allergen Reactions  . Latex Rash     Past Medical History:  Diagnosis Date  . Breast cancer ([place]) 1994   left lumpectomy with lymph node removal c Radiation and chemo  . Breast cancer of lower-outer quadrant of right female breast ([place]) 09/24/2015   Right breast, 7 o'clock:11 mm, histologic grade 2, T1c, N0; ER/PR positive, HER-2/neu not overexpressed. Negative margins  . Colonoscopy refused    Cologuard negative by patient report.  . Depression   . Headache    migraine  Past Surgical History:  Procedure Laterality Date  . BREAST LUMPECTOMY Left 1994   with lymph node removal Dr Amalia Hailey in [place]   . ECTOPIC PREGNANCY SURGERY  1989  . PARTIAL MASTECTOMY WITH AXILLARY SENTINEL LYMPH NODE BIOPSY Right 09/24/2015    Procedure: PARTIAL MASTECTOMY WITH AXILLARY SENTINEL LYMPH NODE BIOPSY, WIDE EXCISION, RECONSTRUCTION/MASTOPLASTY;  Surgeon: Robert Bellow, MD;  Location: [facility] ORS;  Service: General;  Laterality: Right;    Social History   Social History  . Marital status: Divorced    Spouse name: N/A  . Number of children: N/A  . Years of education: N/A   Occupational History  . Not on file.   Social History Main Topics  . Smoking status: Former Smoker    Quit date: 06/19/1983  . Smokeless tobacco: Never Used  . Alcohol use 0.0 oz/week     Comment: 2 drinks per day  . Drug use: No  . Sexual activity: No   Other Topics Concern  . Not on file   Social History Narrative  . No narrative on file    Family History  Problem Relation Age of Onset  . Breast cancer Sister 63    BRCA positive.  . Breast cancer Sister 33    BRCA negative  . Ovarian cancer Mother   . Breast cancer Sister     BRCA positive     Current Outpatient Prescriptions:  .  cetirizine (ZYRTEC) 10 MG tablet, Take 10 mg by mouth daily., Disp: , Rfl:  .  cyclobenzaprine (FLEXERIL) 10 MG tablet, Take by mouth 3 (three) times daily as needed. , Disp: , Rfl:  .  ibuprofen (GOODSENSE IBUPROFEN) 200 MG tablet, Take by mouth every 6 (six) hours as needed. , Disp: , Rfl:  .  letrozole (FEMARA) 2.5 MG tablet, TAKE 1 TABLET (2.5 MG TOTAL) BY MOUTH DAILY., Disp: 30 tablet, Rfl: 2 .  Multiple Vitamin (MULTIVITAMIN) tablet, Take 1 tablet by mouth daily., Disp: , Rfl:  .  SUMAtriptan (IMITREX) 50 MG tablet, Take 50 mg by mouth every 2 (two) hours as needed for migraine. May repeat in 2 hours if headache persists or recurs., Disp: , Rfl:   Physical exam:  Vitals:   06/09/16 1021  BP: (!) 162/97  Pulse: 84  Resp: 18  Temp: 98.7 F (37.1 C)  TempSrc: Tympanic  Weight: 151 lb 14.4 oz (68.9 kg)  Height: _0  (1.626 m)   Physical Exam  Constitutional: She is oriented to person, place, and time and well-developed,  well-nourished, and in no distress.  HENT:  Head: Normocephalic and atraumatic.  Eyes: EOM are normal. Pupils are equal, round, and reactive to light.  Neck: Normal range of motion.  Cardiovascular: Normal rate, regular rhythm and normal heart sounds.   Pulmonary/Chest: Effort normal and breath sounds normal.  Abdominal: Soft. Bowel sounds are normal.  Neurological: She is alert and oriented to person, place, and time.  Skin: Skin is warm and dry.   Breast exam was performed in seated and lying down position. Patient is status post right and left lumpectomy with a well-healed surgical scar. No evidence of any palpable masses. No evidence of axillary adenopathy bilaterally.    CMP Latest Ref Rng & Units 02/28/2016  Glucose 65 - 99 mg/dL -  BUN 6 - 24 mg/dL -  Creatinine 0.57 - 1.00 mg/dL -  Sodium 134 - 144 mmol/L -  Potassium 3.5 - 5.2 mmol/L -  Chloride 96 - 106 mmol/L -  Calcium  8.7 - 10.2 mg/dL -  Total Protein 6.5 - 8.1 g/dL 8.1  Total Bilirubin 0.3 - 1.2 mg/dL 0.8  Alkaline Phos 38 - 126 U/L 98  AST 15 - 41 U/L 37  ALT 14 - 54 U/L 37   CBC Latest Ref Rng & Units 01/30/2016  WBC 3.4 - 10.8 x10E3/uL 3.3(L)  Hemoglobin 12.0 - 16.0 g/dL -  Hematocrit 34.0 - 46.6 % 43.8  Platelets 150 - 379 x10E3/uL 211      Assessment and plan- Patient is a 56 y.o. female with a history of stage IA right breast cancer diagnosed in April 2017 status post lumpectomy and adjuvant radiation. She did not require any adjuvant chemotherapy. She remains on Femara for hormone therapy.  1. Given that she has significant side effects from Femara, I have advised her to hold it for a total period of 3 weeks. We will make a follow-up phone call to see how her symptoms are doing and if they are indeed much improved, it is likely secondary to Femara. I discussed the alternatives including trying Aromasin versus tamoxifen. Patient really wants to stop her hormone therapy tablet and go through the side effects of  a daily basis. However she is willing to try alternatives after taking a break for 3 weeks.    2. Osteopenia- She will also continue her calcium and vitamin D supplements for osteopenia. I will hold off on starting bisphosphonates at this time given that her 10 year probability of a major osteoporotic fractures is less than 20% and that of hip fracture is less than 3%. Patient continues to follow-up with surgery and radiation oncology. I will see her back in 4 months and check CMP at that time. She is due for another mammogram in February 2018 which Will be coordinated by her surgeon  3. Hypertension- patient states that her blood pressure typically does not run this high. She has had blood pressure checks at her workplace and its typically in the 130s. I have advised her to keep a log of her blood pressure twice a week and get it checked at her workplace. She will be seeing her primary care doctor in about 2 weeks' time and further changes can be made at that time.  4. Patient is a family history of BRCA positivity. she has not had genetic testing done yet due to cost issues. She will discuss this further with Dr. Tollie Pizza    Total face to face encounter time for this patient visit was 30 min. >50% of the time was  spent in counseling and coordination of care.     Visit Diagnosis 1. Malignant neoplasm of lower-outer quadrant of right breast of female, estrogen receptor positive ([place])      Dr. Randa Evens, MD, MPH [facility] Pager- [phone] 06/09/2016 9:26 AM

## 2016-06-10 ENCOUNTER — Ambulatory Visit: Payer: No Typology Code available for payment source

## 2016-06-10 ENCOUNTER — Other Ambulatory Visit: Payer: No Typology Code available for payment source

## 2016-06-10 ENCOUNTER — Other Ambulatory Visit: Payer: Self-pay | Admitting: *Deleted

## 2016-06-10 DIAGNOSIS — Z170 Estrogen receptor positive status [ER+]: Principal | ICD-10-CM

## 2016-06-10 DIAGNOSIS — C50511 Malignant neoplasm of lower-outer quadrant of right female breast: Secondary | ICD-10-CM

## 2016-06-10 LAB — CANCER ANTIGEN 27.29: CA 27.29: 16.5 U/mL (ref 0.0–38.6)

## 2016-06-12 ENCOUNTER — Ambulatory Visit: Payer: Self-pay | Admitting: *Deleted

## 2016-06-12 VITALS — BP 132/82

## 2016-06-12 DIAGNOSIS — I10 Essential (primary) hypertension: Secondary | ICD-10-CM

## 2016-06-12 NOTE — Progress Notes (Signed)
Pt reports BP being elevated (160's/90's) last 2 visits to oncologist. Seeking BP checks over next 2 weeks as log for upcoming PCP appt. Was started on new med, Letrozole, prior to elevated BP readings. Has stopped this as of 06/09/16 for next 3 weeks to see if BP improves. Will route BP log to PCP and oncologist prior to next appointment for review at pt request.

## 2016-06-15 ENCOUNTER — Ambulatory Visit: Payer: Self-pay | Admitting: *Deleted

## 2016-06-15 VITALS — BP 150/98 | HR 84

## 2016-06-15 DIAGNOSIS — I10 Essential (primary) hypertension: Secondary | ICD-10-CM

## 2016-06-15 NOTE — Progress Notes (Signed)
BP check for log for upcoming PCP visit. No complaints today. Denies HTN sx.

## 2016-06-16 ENCOUNTER — Ambulatory Visit: Payer: Self-pay | Admitting: *Deleted

## 2016-06-16 VITALS — BP 148/96

## 2016-06-16 DIAGNOSIS — I10 Essential (primary) hypertension: Secondary | ICD-10-CM

## 2016-06-16 NOTE — Progress Notes (Signed)
BP check for log for upcoming PCP appt. Denies HTN sx.

## 2016-06-18 ENCOUNTER — Ambulatory Visit: Payer: Self-pay | Admitting: *Deleted

## 2016-06-18 VITALS — BP 142/82

## 2016-06-18 DIAGNOSIS — I10 Essential (primary) hypertension: Secondary | ICD-10-CM

## 2016-06-18 NOTE — Progress Notes (Signed)
BP check for log for upcoming PCP visit.

## 2016-06-19 ENCOUNTER — Ambulatory Visit: Payer: Self-pay | Admitting: *Deleted

## 2016-06-19 VITALS — BP 144/86

## 2016-06-19 DIAGNOSIS — I10 Essential (primary) hypertension: Secondary | ICD-10-CM

## 2016-06-19 NOTE — Progress Notes (Signed)
BP check for log for upcoming PCP appt.

## 2016-06-22 ENCOUNTER — Ambulatory Visit: Payer: Self-pay | Admitting: *Deleted

## 2016-06-22 VITALS — BP 146/86

## 2016-06-22 DIAGNOSIS — I10 Essential (primary) hypertension: Secondary | ICD-10-CM

## 2016-06-22 NOTE — Progress Notes (Signed)
BP check for log for PCP.

## 2016-06-23 ENCOUNTER — Ambulatory Visit: Payer: Self-pay | Admitting: *Deleted

## 2016-06-23 VITALS — BP 142/88

## 2016-06-23 NOTE — Progress Notes (Signed)
BP check for log. Hard copy of log given to pt. Visits routed to PCP Frazier Richards.

## 2016-06-30 ENCOUNTER — Telehealth: Payer: Self-pay | Admitting: *Deleted

## 2016-06-30 NOTE — Telephone Encounter (Signed)
Called pt and left message asking her if she feels better from stopping the AI.  She had sx of depression, hard time sleeping, hot flashes, leg cramps. I asked if these sx got better off the AI-the med she was taking for breast cancer. I have asked her to call me back and see how she is feeling and then can make decision what next to do.

## 2016-07-03 ENCOUNTER — Telehealth: Payer: Self-pay | Admitting: *Deleted

## 2016-07-03 ENCOUNTER — Ambulatory Visit: Payer: Self-pay | Admitting: Radiation Oncology

## 2016-07-03 NOTE — Telephone Encounter (Signed)
Pt was calling back to tell me about her side effects. She states that after she stopped the AI about 3 days later she started noticing a bid change in how she feels.  The leg cramps went away. She could get a good night sleep and felt like she woke up with not feeling depressed .  She feels like she has quality of life again.  I asked her about changing to another AI and trying to see if it will be less side effects. She states she has thought about it long and hard and spoke to others including people in medical field and she does not want to try another AI and she understands her risk of recurrence is higher.  I told her I would tell Dr. Janese Banks and call her back . Dr. Janese Banks wanted her to know that she has the option of tamoxifen and it usually does not cause the side effects that AI does. If she would consider that and if she wants to come and in see Dr. Janese Banks to have another conversation or speak more about tamoxifen. I called pt back and got voicemail and left the above message on her voicemail about what Dr. Janese Banks said. Will await her call back

## 2016-07-09 ENCOUNTER — Ambulatory Visit
Admission: RE | Admit: 2016-07-09 | Discharge: 2016-07-09 | Disposition: A | Discharge status: Home / Self Care | Payer: No Typology Code available for payment source | Source: Ambulatory Visit | Attending: Radiation Oncology | Admitting: Radiation Oncology

## 2016-07-09 ENCOUNTER — Encounter: Payer: Self-pay | Admitting: Radiation Oncology

## 2016-07-09 VITALS — BP 159/86 | HR 78 | Temp 97.5°F | Wt 147.5 lb

## 2016-07-09 DIAGNOSIS — Z170 Estrogen receptor positive status [ER+]: Secondary | ICD-10-CM | POA: Insufficient documentation

## 2016-07-09 DIAGNOSIS — C50511 Malignant neoplasm of lower-outer quadrant of right female breast: Secondary | ICD-10-CM | POA: Insufficient documentation

## 2016-07-09 DIAGNOSIS — Z923 Personal history of irradiation: Secondary | ICD-10-CM | POA: Diagnosis not present

## 2016-07-09 NOTE — Progress Notes (Signed)
Radiation Oncology Follow up Note  Name: Amy Holland   Date:   07/09/2016 MRN:  [id number] DOB: 03/01/1961    This 56 y.o. female presents to the clinic today for six-month follow-up status post whole breast radiation to her right breast for ER/PR positive invasive mammary carcinoma stage I.  REFERRING PROVIDER: Kirk Ruths, MD  HPI: Patient is a 56 year old female now out 6 months having completed whole breast radiation to her right breast for stage I ER/PR positive HER-2/neu negative invasive mammary carcinoma. Seen today in routine follow-up she is doing fairly well she was on letrozole all the cause significant psychological problems that she has currently off antiestrogen therapy. That will be readdressed by medical oncology.. She is not yet had follow-up mammograms there ordered for the next month.  COMPLICATIONS OF TREATMENT: none  FOLLOW UP COMPLIANCE: keeps appointments   PHYSICAL EXAM:  BP (!) 159/86   Pulse 78   Temp 97.5 F (36.4 C)   Wt 147 lb 7.8 oz (66.9 kg)   BMI 25.32 kg/m  Lungs are clear to A&P cardiac examination essentially unremarkable with regular rate and rhythm. No dominant mass or nodularity is noted in either breast in 2 positions examined. Incision is well-healed. No axillary or supraclavicular adenopathy is appreciated. Cosmetic result is excellent. Well-developed well-nourished patient in NAD. HEENT reveals PERLA, EOMI, discs not visualized.  Oral cavity is clear. No oral mucosal lesions are identified. Neck is clear without evidence of cervical or supraclavicular adenopathy. Lungs are clear to A&P. Cardiac examination is essentially unremarkable with regular rate and rhythm without murmur rub or thrill. Abdomen is benign with no organomegaly or masses noted. Motor sensory and DTR levels are equal and symmetric in the upper and lower extremities. Cranial nerves II through XII are grossly intact. Proprioception is intact. No peripheral adenopathy or edema  is identified. No motor or sensory levels are noted. Crude visual fields are within normal range.  RADIOLOGY RESULTS: No current films for review  PLAN: Present time she is doing well. Of explained further many antiestrogen therapy she may want to try another 1 and see if there's similar problems. She will have this discussion with medical oncology. Otherwise I'm please were overall progress. I've asked to see her back in 6 months for follow-up. Patient is to call sooner with any concerns.  I would like to take this opportunity to thank you for allowing me to participate in the care of your patient.Armstead Peaks., MD

## 2016-07-13 ENCOUNTER — Telehealth: Payer: Self-pay | Admitting: *Deleted

## 2016-07-13 NOTE — Telephone Encounter (Signed)
Pt called back and said that she was given all the options including the Tamoxifen and she can discuss again when she sees Dr. Janese Banks in May but for now she feels like she is doing what is best for her by staying off any medication for breast cancer.

## 2016-07-13 NOTE — Telephone Encounter (Signed)
Called pt after I had read the note from Dr. Baruch Gouty appt. That pt wants to think about AI and will talk about it on her next appt. I did call her back and left message that

## 2016-07-16 DIAGNOSIS — R87620 Atypical squamous cells of undetermined significance on cytologic smear of vagina (ASC-US): Secondary | ICD-10-CM | POA: Insufficient documentation

## 2016-07-16 DIAGNOSIS — R87811 Vaginal high risk human papillomavirus (HPV) DNA test positive: Secondary | ICD-10-CM

## 2016-07-16 DIAGNOSIS — Z0000 Encounter for general adult medical examination without abnormal findings: Secondary | ICD-10-CM | POA: Insufficient documentation

## 2016-07-16 DIAGNOSIS — R0683 Snoring: Secondary | ICD-10-CM | POA: Insufficient documentation

## 2016-07-16 DIAGNOSIS — R030 Elevated blood-pressure reading, without diagnosis of hypertension: Secondary | ICD-10-CM | POA: Insufficient documentation

## 2016-07-20 ENCOUNTER — Ambulatory Visit: Payer: Self-pay | Admitting: *Deleted

## 2016-07-20 VITALS — BP 138/84

## 2016-07-20 DIAGNOSIS — I10 Essential (primary) hypertension: Secondary | ICD-10-CM

## 2016-07-20 NOTE — Progress Notes (Signed)
BP check for log for upcoming appts.

## 2016-07-23 ENCOUNTER — Other Ambulatory Visit: Payer: Self-pay

## 2016-07-23 DIAGNOSIS — C50511 Malignant neoplasm of lower-outer quadrant of right female breast: Secondary | ICD-10-CM

## 2016-07-27 ENCOUNTER — Ambulatory Visit: Payer: Self-pay | Admitting: *Deleted

## 2016-07-27 VITALS — BP 130/80

## 2016-07-27 DIAGNOSIS — I10 Essential (primary) hypertension: Secondary | ICD-10-CM

## 2016-07-27 NOTE — Progress Notes (Signed)
BP check for BP log for MD.

## 2016-08-04 ENCOUNTER — Ambulatory Visit: Payer: Self-pay | Admitting: *Deleted

## 2016-08-04 VITALS — BP 144/84

## 2016-08-04 DIAGNOSIS — I10 Essential (primary) hypertension: Secondary | ICD-10-CM

## 2016-08-04 NOTE — Progress Notes (Signed)
BP check for BP log for PCP.

## 2016-08-10 ENCOUNTER — Ambulatory Visit: Payer: Self-pay | Admitting: *Deleted

## 2016-08-10 VITALS — BP 142/82

## 2016-08-10 DIAGNOSIS — I10 Essential (primary) hypertension: Secondary | ICD-10-CM

## 2016-08-17 ENCOUNTER — Ambulatory Visit: Payer: Self-pay | Admitting: *Deleted

## 2016-08-17 VITALS — BP 140/84

## 2016-08-17 DIAGNOSIS — I10 Essential (primary) hypertension: Secondary | ICD-10-CM

## 2016-08-17 NOTE — Progress Notes (Signed)
Weekly BP checks have been stable for past 5 weeks. For this reason, decreasing frequency to biweekly or monthly per pt's choice or symptoms on a walk-in basis. She is not scheduled to see PCP until August now. If BP becomes unstable again, will return to weekly checks with recommendation for sooner PCP f/u.

## 2016-08-27 ENCOUNTER — Telehealth: Payer: Self-pay | Admitting: *Deleted

## 2016-08-27 NOTE — Telephone Encounter (Signed)
Patient called wanting to know if you could prescribe her anything to take prior to her upcoming mammogram that is scheduled on April 2nd. She is having anxiety about this mammogram and not sure she can even walk into [place] to have this done. Her pharmacy is [facility] on [address]

## 2016-08-31 ENCOUNTER — Ambulatory Visit: Payer: Self-pay | Admitting: *Deleted

## 2016-08-31 ENCOUNTER — Other Ambulatory Visit: Payer: Self-pay | Admitting: General Surgery

## 2016-08-31 VITALS — BP 152/100

## 2016-08-31 DIAGNOSIS — I10 Essential (primary) hypertension: Secondary | ICD-10-CM

## 2016-08-31 MED ORDER — LORAZEPAM 1 MG PO TABS: 1.0000 mg | ORAL_TABLET | Freq: Two times a day (BID) | ORAL | 0 refills | Status: DC

## 2016-08-31 NOTE — Progress Notes (Signed)
Patient called reporting extreme anxiety with her upcoming mammogram, the first study since her treatment with surgery and radiation for breast cancer.  She is willing to have her sister drive her to the study. We'll have her make use of Ativan 1 mg the evening prior to the study and 1 mg 1 hour before.  She was asked to call the day that she has to studies done so they can be independently reviewed before her scheduled appointment to help minimize any further anxiety. She was advised that the radiologist will speak with her after the study.

## 2016-08-31 NOTE — Progress Notes (Signed)
Pt presents for weekly BP check. Elevated today. Reports this is most likely r/t just getting off the phone with her surgeon. She requested an anti-anxiety medication to take the am of her surveillance mammogram that she has Monday. She is extremely nervous about this imaging as it determines her remission status. She sts surgeon relunctant to Rx unless she has someone to drive her. RN agrees with this and encourages pt to arrange for this so she can have a less stressful imaging experience. Pt seems agreeable but does not openly voice this. Pt quick to leave clinic after BP check. Appears upset by medication Rx situation and elevated BP. On her way out, encouraged her to RTC this afternoon for recheck once she has a chance to calm down.

## 2016-09-04 ENCOUNTER — Telehealth: Payer: Self-pay | Admitting: *Deleted

## 2016-09-04 NOTE — Telephone Encounter (Signed)
Pt reports large outstanding bill from January OV at [facility]. Requesting help with clarification as her OV are not typically this expensive. RN called [facility], no bills pending with them. With pt on phone line, called [facility]. They report an outstanding balance after insurance adjustment of $486.20 for date of service 06-09-16. Charges include a level 3 established pt visit and 3 labs. Attempted to call pt's insurance company with pt to discuss, but they are closed for the Holiday. Will attempt next week when they reopen.  Due to this, pt is requesting labs be performed in clinic. RN called [facility] and received pricing on typical labs performed for pt. Staff message sent to Oncologist requesting any additional labs that they prefer prior to pt's 5-1 appt. Will f/u with pt after receipt of any additional orders.

## 2016-09-07 ENCOUNTER — Ambulatory Visit
Admission: RE | Admit: 2016-09-07 | Discharge: 2016-09-07 | Disposition: A | Discharge status: Home / Self Care | Payer: No Typology Code available for payment source | Source: Ambulatory Visit | Attending: General Surgery | Admitting: General Surgery

## 2016-09-07 DIAGNOSIS — Z9889 Other specified postprocedural states: Secondary | ICD-10-CM | POA: Insufficient documentation

## 2016-09-07 DIAGNOSIS — C50511 Malignant neoplasm of lower-outer quadrant of right female breast: Secondary | ICD-10-CM

## 2016-09-14 ENCOUNTER — Ambulatory Visit: Payer: Self-pay | Admitting: *Deleted

## 2016-09-14 VITALS — BP 154/90

## 2016-09-14 DIAGNOSIS — I10 Essential (primary) hypertension: Secondary | ICD-10-CM

## 2016-09-16 ENCOUNTER — Encounter: Payer: Self-pay | Admitting: General Surgery

## 2016-09-16 ENCOUNTER — Ambulatory Visit (INDEPENDENT_AMBULATORY_CARE_PROVIDER_SITE_OTHER): Payer: PRIVATE HEALTH INSURANCE | Admitting: General Surgery

## 2016-09-16 VITALS — BP 128/72 | HR 78 | Resp 12 | Ht 64.0 in | Wt 144.0 lb

## 2016-09-16 DIAGNOSIS — C50511 Malignant neoplasm of lower-outer quadrant of right female breast: Secondary | ICD-10-CM | POA: Diagnosis not present

## 2016-09-16 NOTE — Patient Instructions (Addendum)
The patient is aware to call back for any questions or concerns. The patient has been asked to return to the office in one year with a bilateral diagnostic mammogram. 

## 2016-09-16 NOTE — Progress Notes (Signed)
Patient ID: Amy Holland, female   DOB: 01-17-1961, 56 y.o.   MRN: [id number]  Chief Complaint  Patient presents with  . Follow-up    HPI Amy Holland is a 56 y.o. female.  who presents for her follow up breast cancer and a breast evaluation. The most recent mammogram was done on 09-07-16.  The right breast is mildly tender around nipple, unchanged since radiation. Patient does perform regular self breast checks and gets regular mammograms done.  No new breast issues. She stopped the Femara in January after talking with Dr Janese Banks because of the side affects(depressed and poor appetite).   HPI  Past Medical History:  Diagnosis Date  . Breast cancer ([place]) 1994   left lumpectomy with lymph node removal c Radiation and chemo ER/PR negative  . Breast cancer of lower-outer quadrant of right female breast ([place]) 09/24/2015   Right breast, 7 o'clock:11 mm, histologic grade 2, T1c, N0; ER/PR positive, HER-2/neu not overexpressed. Negative margins  . Colonoscopy refused    Cologuard negative by patient report.  . Depression   . Headache    migraine    Past Surgical History:  Procedure Laterality Date  . BREAST LUMPECTOMY Left 1994   with lymph node removal Dr Amalia Hailey in [place]   . BREAST LUMPECTOMY Right 09/24/2015   breast cancer Right breast, 7 o'clock:11 mm, histologic grade 2, T1c, N0; ER/PR positive, HER-2/neu not overexpressed. Negative margins  . ECTOPIC PREGNANCY SURGERY  1989  . PARTIAL MASTECTOMY WITH AXILLARY SENTINEL LYMPH NODE BIOPSY Right 09/24/2015   Procedure: PARTIAL MASTECTOMY WITH AXILLARY SENTINEL LYMPH NODE BIOPSY, WIDE EXCISION, RECONSTRUCTION/MASTOPLASTY;  Surgeon: Robert Bellow, MD;  Location: [facility] ORS;  Service: General;  Laterality: Right;    Family History  Problem Relation Age of Onset  . Breast cancer Sister 85    BRCA positive.  . Breast cancer Sister 14    BRCA negative  . Ovarian cancer Mother     Social History Social History  Substance Use Topics   . Smoking status: Former Smoker    Quit date: 06/19/1983  . Smokeless tobacco: Never Used  . Alcohol use 1.2 oz/week    2 Glasses of wine per week     Comment: 2 drinks per day    Allergies  Allergen Reactions  . Letrozole Other (See Comments)    Patient states the side effects changed her quality of life while taking  . Latex Rash    Current Outpatient Prescriptions  Medication Sig Dispense Refill  . cetirizine (ZYRTEC) 10 MG tablet Take 10 mg by mouth daily.    . fluticasone (FLONASE) 50 MCG/ACT nasal spray Place 1 spray into both nostrils daily.    Marland Kitchen ibuprofen (GOODSENSE IBUPROFEN) 200 MG tablet Take by mouth every 6 (six) hours as needed.     Marland Kitchen LORazepam (ATIVAN) 1 MG tablet Take 1 tablet (1 mg total) by mouth 2 (two) times daily. Take one evening before mammogram, second one hour before the study. 2 tablet 0  . Multiple Vitamin (MULTIVITAMIN) tablet Take 1 tablet by mouth daily.    . SUMAtriptan (IMITREX) 50 MG tablet Take 50 mg by mouth every 2 (two) hours as needed for migraine. May repeat in 2 hours if headache persists or recurs.     No current facility-administered medications for this visit.     Review of Systems Review of Systems  Constitutional: Negative.   Respiratory: Negative.   Cardiovascular: Negative.     Blood pressure 128/72, pulse 78, resp.  rate 12, height 5' 4"  (1.626 m), weight 144 lb (65.3 kg).  Physical Exam Physical Exam  Constitutional: She is oriented to person, place, and time. She appears well-developed and well-nourished.  HENT:  Mouth/Throat: Oropharynx is clear and moist.  Eyes: Conjunctivae are normal. No scleral icterus.  Neck: Neck supple.  Cardiovascular: Normal rate, regular rhythm and normal heart sounds.   Pulmonary/Chest: Effort normal and breath sounds normal. Right breast exhibits no inverted nipple, no mass, no nipple discharge, no skin change and no tenderness. Left breast exhibits tenderness. Left breast exhibits no inverted  nipple, no mass, no nipple discharge and no skin change.    Mild tenderness left breast along scar. Calcifications in scar at 9 o'clock left breast. Thickening right breast scar at 8 o'clock.   Lymphadenopathy:    She has no cervical adenopathy.    She has no axillary adenopathy.  Neurological: She is alert and oriented to person, place, and time.  Skin: Skin is warm and dry.  Psychiatric: Her behavior is normal.    Data Reviewed 06/09/2016 medical oncology notes reviewed. Plans were made for a drug holiday from Femara to see if her symptoms resolved, which they did.  Bilateral diagnostic mammograms dated 09/07/2016 were reviewed. Postsurgical changes on the right. BI-RADS-2.  Assessment    No evidence of recurrent breast cancer.    Plan    The patient was informed that her original good prognosis for a T1c (11 mm) invasive mammary carcinoma was based on her making use of an antiestrogen. She is not interested in trying another aromatase inhibitor or tamoxifen. She is aware that this may impact her chance for recurrent cancer.  We reviewed options for genetic testing. She has had at least one sister reportedly test positive for BRCA, but she does not have access to the test results. We'll explore whether genetic testing is cost effective for her, and if she is amenable to proceed we'll arrange for this.     The patient has been asked to return to the office in one year with a bilateral diagnostic mammogram.    HPI, Physical Exam, Assessment and Plan have been scribed under the direction and in the presence of Robert Bellow, MD.  Karie Fetch, RN  I have completed the exam and reviewed the above documentation for accuracy and completeness.  I agree with the above.  Haematologist has been used and any errors in dictation or transcription are unintentional.  Hervey Ard, M.D., F.A.C.S. Robert Bellow 09/17/2016, 4:05 PM

## 2016-09-28 ENCOUNTER — Encounter: Payer: Self-pay | Admitting: *Deleted

## 2016-09-28 ENCOUNTER — Ambulatory Visit: Payer: Self-pay | Admitting: *Deleted

## 2016-09-28 ENCOUNTER — Telehealth: Payer: Self-pay | Admitting: *Deleted

## 2016-09-28 VITALS — BP 131/87 | HR 72

## 2016-09-28 DIAGNOSIS — I10 Essential (primary) hypertension: Secondary | ICD-10-CM

## 2016-09-28 NOTE — Progress Notes (Signed)
Weekly BP check.  

## 2016-09-28 NOTE — Telephone Encounter (Signed)
I also called pt to let her know that met c only was faxed to her work to the attn of [employer]. Pt was glad to hear

## 2016-09-28 NOTE — Telephone Encounter (Signed)
Wants to have her labs drawn at her work due to costs. She works at [employer]. She has a CBC ordered for 5/1 and the ON from January states Dr Janese Banks wanted to check a CMP on her return in May. She states the Occupational Health Nurse emailed Dr Janese Banks for orders and was told she did not need labs. Please double check and fax an order for CBC, CMP to Attn: Amy Holland [phone].

## 2016-09-28 NOTE — Telephone Encounter (Signed)
Can we fax cmp order? No need for cbc

## 2016-09-28 NOTE — Telephone Encounter (Signed)
I have sent fax to the below fax number to the attn : of haley workman

## 2016-10-01 ENCOUNTER — Other Ambulatory Visit: Payer: Self-pay | Admitting: *Deleted

## 2016-10-01 DIAGNOSIS — C50511 Malignant neoplasm of lower-outer quadrant of right female breast: Secondary | ICD-10-CM

## 2016-10-02 LAB — COMPREHENSIVE METABOLIC PANEL
ALBUMIN: 5 g/dL (ref 3.5–5.5)
ALK PHOS: 109 IU/L (ref 39–117)
ALT: 27 IU/L (ref 0–32)
AST: 28 IU/L (ref 0–40)
Albumin/Globulin Ratio: 1.9 (ref 1.2–2.2)
BILIRUBIN TOTAL: 0.4 mg/dL (ref 0.0–1.2)
BUN / CREAT RATIO: 19 (ref 9–23)
BUN: 15 mg/dL (ref 6–24)
CHLORIDE: 100 mmol/L (ref 96–106)
CO2: 26 mmol/L (ref 18–29)
Calcium: 10.8 mg/dL — ABNORMAL HIGH (ref 8.7–10.2)
Creatinine, Ser: 0.77 mg/dL (ref 0.57–1.00)
GFR calc Af Amer: 98 mL/min/{1.73_m2} (ref >59)
GFR calc non Af Amer: 85 mL/min/{1.73_m2} (ref >59)
GLOBULIN, TOTAL: 2.7 g/dL (ref 1.5–4.5)
Glucose: 88 mg/dL (ref 65–99)
POTASSIUM: 4.1 mmol/L (ref 3.5–5.2)
SODIUM: 142 mmol/L (ref 134–144)
Total Protein: 7.7 g/dL (ref 6.0–8.5)

## 2016-10-02 NOTE — Progress Notes (Signed)
No calcium supplements. Gave list of symptoms to watch for r/t hypercalcemia. Sees ordering oncologist 5/1. Results routed to ordering provider.

## 2016-10-02 NOTE — Progress Notes (Signed)
Does not take multivitamin consistently. Results sent directly to provider with request to contact pt with any recommendations prior to her 5/1 appt.

## 2016-10-06 ENCOUNTER — Inpatient Hospital Stay: Payer: No Typology Code available for payment source | Attending: Oncology | Admitting: Oncology

## 2016-10-06 ENCOUNTER — Encounter: Payer: Self-pay | Admitting: Oncology

## 2016-10-06 ENCOUNTER — Ambulatory Visit: Payer: Self-pay | Admitting: *Deleted

## 2016-10-06 ENCOUNTER — Other Ambulatory Visit: Payer: No Typology Code available for payment source

## 2016-10-06 VITALS — BP 144/79 | HR 87 | Temp 96.7°F | Resp 18 | Wt 143.1 lb

## 2016-10-06 VITALS — BP 135/88 | HR 79

## 2016-10-06 DIAGNOSIS — I10 Essential (primary) hypertension: Secondary | ICD-10-CM

## 2016-10-06 DIAGNOSIS — Z171 Estrogen receptor negative status [ER-]: Secondary | ICD-10-CM | POA: Diagnosis not present

## 2016-10-06 DIAGNOSIS — Z79899 Other long term (current) drug therapy: Secondary | ICD-10-CM | POA: Diagnosis not present

## 2016-10-06 DIAGNOSIS — Z170 Estrogen receptor positive status [ER+]: Secondary | ICD-10-CM | POA: Insufficient documentation

## 2016-10-06 DIAGNOSIS — C50511 Malignant neoplasm of lower-outer quadrant of right female breast: Secondary | ICD-10-CM | POA: Diagnosis not present

## 2016-10-06 DIAGNOSIS — Z87891 Personal history of nicotine dependence: Secondary | ICD-10-CM | POA: Diagnosis not present

## 2016-10-06 DIAGNOSIS — Z853 Personal history of malignant neoplasm of breast: Secondary | ICD-10-CM | POA: Insufficient documentation

## 2016-10-06 NOTE — Progress Notes (Signed)
Here for follow up.  Seeing therapist today. Denies self harm ideation. Weepy episodes support given

## 2016-10-06 NOTE — Progress Notes (Signed)
Hematology/Oncology Consult note [facility]  [phone] [phone]  Patient Care Team: Kirk Ruths, MD as PCP - General (Internal Medicine) Kirk Ruths, MD (Internal Medicine) Robert Bellow, MD (General Surgery)   Name of the patient: Amy Holland  [id number]  [birth year]   Date of visit: 10/06/16  stage IA right breast cancer  Chief complaint/ Reason for visit- routine follow-up of breast cancer  Heme/Onc history: Harleyquinn Gasser a 56 y.o.femalewith a history of bilateral breast cancerand a family history of BRCA1 mutation. She was diagnosed with stage I left breastcancer in 39 (age of 56). No pathology is available (patient reports hormone receptor negative). She underwent lumpectomy, followed by 5 cycles of Adriamycin and Cytoxan (AC) and radiation.   She had recurrent  stage IA right breast cancer s/p wide excision and sentinel lymph node biopsy on 09/24/2015. Pathology revealed a 1.1 cm grade II invasive mammary carcinoma. There was no lymphovascular invasion. Margins were negative. Two lymph nodes were negative. ER was >90%, PR 11-50%, and Her2/neu 1+. Pathologic stage was T1cN0Mx.  She received 50.4 Gyfrom 10/28/2015 - 12/05/2015 to the right breast with a 14.4 Gy scar boost from 12/06/2015 - 12/18/2015.   MammaPrinton 09/24/2015 revealed low risk luminal-type A with a distant rate of recurrence in 5 years of 5% (CI: 1-9%) and in 10 years of 10% (CI: 4-15%).   She began [facility] 02/03/2016. Patient was noted to have osteopenia with a T score of -1.9 at the lumbar spine. 10 year probability of a major osteoporotic fracture was 14.3% and hip fracture was 1.7%  Patient stopped taking AI in Jan 2018 due to problems with insomnia, arthraligias and mood swings  Interval history- Patient continues to have episodes of tearfulness and feeling low and depressed. She denies any suicidal or  homicidal ideations  ECOG PS- 0   Review of systems- Review of Systems  Constitutional: Negative for chills, fever, malaise/fatigue and weight loss.  HENT: Negative for congestion, ear discharge and nosebleeds.   Eyes: Negative for blurred vision.  Respiratory: Negative for cough, hemoptysis, sputum production, shortness of breath and wheezing.   Cardiovascular: Negative for chest pain, palpitations, orthopnea and claudication.  Gastrointestinal: Negative for abdominal pain, blood in stool, constipation, diarrhea, heartburn, melena, nausea and vomiting.  Genitourinary: Negative for dysuria, flank pain, frequency, hematuria and urgency.  Musculoskeletal: Negative for back pain, joint pain and myalgias.  Skin: Negative for rash.  Neurological: Negative for dizziness, tingling, focal weakness, seizures, weakness and headaches.  Endo/Heme/Allergies: Does not bruise/bleed easily.  Psychiatric/Behavioral: Positive for depression. Negative for suicidal ideas. The patient has insomnia.       Allergies  Allergen Reactions  . Letrozole Other (See Comments)    Patient states the side effects changed her quality of life while taking  . Latex Rash     Past Medical History:  Diagnosis Date  . Breast cancer ([place]) 1994   left lumpectomy with lymph node removal c Radiation and chemo ER/PR negative  . Breast cancer of lower-outer quadrant of right female breast ([place]) 09/24/2015   Right breast, 7 o'clock:11 mm, histologic grade 2, T1c, N0; ER/PR positive, HER-2/neu not overexpressed. Negative margins  . Colonoscopy refused    Cologuard negative by patient report.  . Depression   . Headache    migraine     Past Surgical History:  Procedure Laterality Date  . BREAST LUMPECTOMY Left 1994   with lymph node removal Dr Amalia Hailey in [place]   .  BREAST LUMPECTOMY Right 09/24/2015   breast cancer Right breast, 7 o'clock:11 mm, histologic grade 2, T1c, N0; ER/PR positive, HER-2/neu not  overexpressed. Negative margins  . ECTOPIC PREGNANCY SURGERY  1989  . PARTIAL MASTECTOMY WITH AXILLARY SENTINEL LYMPH NODE BIOPSY Right 09/24/2015   Procedure: PARTIAL MASTECTOMY WITH AXILLARY SENTINEL LYMPH NODE BIOPSY, WIDE EXCISION, RECONSTRUCTION/MASTOPLASTY;  Surgeon: Robert Bellow, MD;  Location: [facility] ORS;  Service: General;  Laterality: Right;    Social History   Social History  . Marital status: Divorced    Spouse name: N/A  . Number of children: N/A  . Years of education: N/A   Occupational History  . Not on file.   Social History Main Topics  . Smoking status: Former Smoker    Quit date: 06/19/1983  . Smokeless tobacco: Never Used  . Alcohol use 1.2 oz/week    2 Glasses of wine per week     Comment: 2 drinks per day  . Drug use: No  . Sexual activity: No   Other Topics Concern  . Not on file   Social History Narrative  . No narrative on file    Family History  Problem Relation Age of Onset  . Breast cancer Sister 36    BRCA positive.  . Breast cancer Sister 59    BRCA negative  . Ovarian cancer Mother      Current Outpatient Prescriptions:  .  cetirizine (ZYRTEC) 10 MG tablet, Take 10 mg by mouth daily., Disp: , Rfl:  .  fluticasone (FLONASE) 50 MCG/ACT nasal spray, Place 1 spray into both nostrils daily., Disp: , Rfl:  .  ibuprofen (GOODSENSE IBUPROFEN) 200 MG tablet, Take by mouth every 6 (six) hours as needed. , Disp: , Rfl:  .  Multiple Vitamin (MULTIVITAMIN) tablet, Take 1 tablet by mouth daily., Disp: , Rfl:  .  SUMAtriptan (IMITREX) 50 MG tablet, Take 50 mg by mouth every 2 (two) hours as needed for migraine. May repeat in 2 hours if headache persists or recurs., Disp: , Rfl:   Physical exam:  Vitals:   10/06/16 1016  BP: (!) 144/79  Pulse: 87  Resp: 18  Temp: (!) 96.7 F (35.9 C)  TempSrc: Tympanic  Weight: 143 lb 1.6 oz (64.9 kg)   Physical Exam  Constitutional: She is oriented to person, place, and time and well-developed,  well-nourished, and in no distress.  HENT:  Head: Normocephalic and atraumatic.  Eyes: EOM are normal. Pupils are equal, round, and reactive to light.  Neck: Normal range of motion.  Cardiovascular: Normal rate, regular rhythm and normal heart sounds.   Pulmonary/Chest: Effort normal and breath sounds normal.  Abdominal: Soft. Bowel sounds are normal.  Neurological: She is alert and oriented to person, place, and time.  Skin: Skin is warm and dry.   Breast exam was performed in seated and lying down position. Patient is status post right lumpectomy with a well-healed surgical scar. No evidence of any palpable masses. No evidence of axillary adenopathy. No evidence of any palpable masses or lumps in the left breast. No evidence of leftt axillary adenopathy  CMP Latest Ref Rng & Units 10/01/2016  Glucose 65 - 99 mg/dL 88  BUN 6 - 24 mg/dL 15  Creatinine 0.57 - 1.00 mg/dL 0.77  Sodium 134 - 144 mmol/L 142  Potassium 3.5 - 5.2 mmol/L 4.1  Chloride 96 - 106 mmol/L 100  CO2 18 - 29 mmol/L 26  Calcium 8.7 - 10.2 mg/dL 10.8(H)  Total Protein 6.0 -  8.5 g/dL 7.7  Total Bilirubin 0.0 - 1.2 mg/dL 0.4  Alkaline Phos 39 - 117 IU/L 109  AST 0 - 40 IU/L 28  ALT 0 - 32 IU/L 27   CBC Latest Ref Rng & Units 06/09/2016  WBC 3.6 - 11.0 K/uL 4.3  Hemoglobin 12.0 - 16.0 g/dL 15.6  Hematocrit 35.0 - 47.0 % 45.2  Platelets 150 - 440 K/uL 197    No images are attached to the encounter.  Mm Diag Breast Tomo Bilateral  Result Date: 09/07/2016 CLINICAL DATA:  56 year old female presenting for annual evaluation status post right breast lumpectomy in 2017. She also has more distant history of left breast lumpectomy in 1994.The patient has strong family history of breast cancer. EXAM: 2D DIGITAL DIAGNOSTIC BILATERAL MAMMOGRAM WITH CAD AND ADJUNCT TOMO COMPARISON:  Previous exam(s). ACR Breast Density Category c: The breast tissue is heterogeneously dense, which may obscure small masses. FINDINGS: Interval  surgical changes in the superior right breast consistent with history of interval lumpectomy. The left breast lumpectomy site is stable. No suspicious calcifications, masses or areas of distortion are seen in the bilateral breasts. Mammographic images were processed with CAD. IMPRESSION: 1. Interval surgical changes in the right breast consistent with history of lumpectomy. The left breast lumpectomy site is stable. No mammographic evidence of malignancy in the bilateral breasts. RECOMMENDATION: Diagnostic mammogram is suggested in 1 year. (Code:DM-B-01Y) I have discussed the findings and recommendations with the patient. Results were also provided in writing at the conclusion of the visit. If applicable, a reminder letter will be sent to the patient regarding the next appointment. BI-RADS CATEGORY  2: Benign. Electronically Signed   By: Ammie Ferrier M.D.   On: 09/07/2016 11:04     Assessment and plan- Patient is a 56 y.o. female with h/o Stage 1 right breast cancer ER PR positive and her 2 neu negative  Patient is unable to continue AI due to problems mentioned above. She does not wish to try tamoxifen. She understands the risk of recurrence by stopping hormone therapy. Recent mammogram from feb 2018 did not reveal any malignancy. I will see her back in 6 months  I have strongly encouraged her to spek to her PCP about starting on anti depressant medications given her symptoms of depression. She has been seeing a Retail banker  We discussed her strong family h/o breast and ovarian cancer and need for genetic testing. She is looking into cost aspect and will decide accordingly  rtc in 6 months   Visit Diagnosis 1. Malignant neoplasm of lower-outer quadrant of right breast of female, estrogen receptor positive ([place])      Dr. Randa Evens, MD, MPH [facility] at [facility] Pager- [phone] 10/06/2016 12:52 PM

## 2016-10-06 NOTE — Progress Notes (Signed)
Weekly BP check. Sumatriptan refill per request. No new migraine/HA today. Just ran out recently. Informed a new Rx from PCP would be needed for next fill. She verbalizes understanding.

## 2016-10-08 ENCOUNTER — Telehealth: Payer: Self-pay | Admitting: *Deleted

## 2016-10-08 NOTE — Telephone Encounter (Signed)
Pt insurance considered out of network with [facility] and it would be no more than 100 dollars to do genetic test, she can either make payments for the bill or she can do financial profile . She will think about it and I will call her in am for a decision. She is agreeable to plan

## 2016-10-09 ENCOUNTER — Telehealth: Payer: Self-pay | Admitting: *Deleted

## 2016-10-09 NOTE — Telephone Encounter (Signed)
Called pt back and got her voicemail and left her a message that I was checking on her to see if she has made a decision of doing genetic test.  I told her she is welcome to call me back at her convenience. She can come anytime to get one tube of blood drawn for the test

## 2016-10-12 ENCOUNTER — Ambulatory Visit: Payer: Self-pay | Admitting: *Deleted

## 2016-10-12 VITALS — BP 121/75 | HR 87

## 2016-10-12 DIAGNOSIS — Z0130 Encounter for examination of blood pressure without abnormal findings: Secondary | ICD-10-CM

## 2016-10-12 DIAGNOSIS — I10 Essential (primary) hypertension: Secondary | ICD-10-CM

## 2016-10-12 NOTE — Progress Notes (Signed)
Weekly BP check.  

## 2016-10-13 ENCOUNTER — Inpatient Hospital Stay: Payer: No Typology Code available for payment source

## 2016-10-13 DIAGNOSIS — C50511 Malignant neoplasm of lower-outer quadrant of right female breast: Secondary | ICD-10-CM

## 2016-10-13 DIAGNOSIS — Z1501 Genetic susceptibility to malignant neoplasm of breast: Secondary | ICD-10-CM

## 2016-10-13 DIAGNOSIS — Z1509 Genetic susceptibility to other malignant neoplasm: Secondary | ICD-10-CM

## 2016-10-13 DIAGNOSIS — Z170 Estrogen receptor positive status [ER+]: Principal | ICD-10-CM

## 2016-10-13 HISTORY — DX: Genetic susceptibility to malignant neoplasm of breast: Z15.09

## 2016-10-13 HISTORY — DX: Genetic susceptibility to malignant neoplasm of breast: Z15.01

## 2016-10-13 LAB — CBC WITH DIFFERENTIAL/PLATELET
BASOS ABS: 0 10*3/uL (ref 0–0.1)
BASOS PCT: 1 %
EOS ABS: 0.1 10*3/uL (ref 0–0.7)
EOS PCT: 3 %
HCT: 42.2 % (ref 35.0–47.0)
Hemoglobin: 14.8 g/dL (ref 12.0–16.0)
Lymphocytes Relative: 19 %
Lymphs Abs: 0.8 10*3/uL — ABNORMAL LOW (ref 1.0–3.6)
MCH: 31.8 pg (ref 26.0–34.0)
MCHC: 35 g/dL (ref 32.0–36.0)
MCV: 91 fL (ref 80.0–100.0)
MONO ABS: 0.3 10*3/uL (ref 0.2–0.9)
Monocytes Relative: 8 %
Neutro Abs: 2.9 10*3/uL (ref 1.4–6.5)
Neutrophils Relative %: 69 %
PLATELETS: 195 10*3/uL (ref 150–440)
RBC: 4.64 MIL/uL (ref 3.80–5.20)
RDW: 12.8 % (ref 11.5–14.5)
WBC: 4.2 10*3/uL (ref 3.6–11.0)

## 2016-11-09 ENCOUNTER — Ambulatory Visit: Payer: Self-pay | Admitting: *Deleted

## 2016-11-09 VITALS — BP 138/82 | HR 86

## 2016-11-09 DIAGNOSIS — I10 Essential (primary) hypertension: Secondary | ICD-10-CM

## 2016-11-13 ENCOUNTER — Inpatient Hospital Stay: Payer: No Typology Code available for payment source | Attending: Oncology | Admitting: Oncology

## 2016-11-13 VITALS — BP 130/78 | HR 83 | Temp 98.2°F | Resp 16 | Wt 140.6 lb

## 2016-11-13 DIAGNOSIS — Z9221 Personal history of antineoplastic chemotherapy: Secondary | ICD-10-CM | POA: Diagnosis not present

## 2016-11-13 DIAGNOSIS — Z853 Personal history of malignant neoplasm of breast: Secondary | ICD-10-CM | POA: Insufficient documentation

## 2016-11-13 DIAGNOSIS — C50511 Malignant neoplasm of lower-outer quadrant of right female breast: Secondary | ICD-10-CM | POA: Diagnosis not present

## 2016-11-13 DIAGNOSIS — Z923 Personal history of irradiation: Secondary | ICD-10-CM

## 2016-11-13 DIAGNOSIS — Z170 Estrogen receptor positive status [ER+]: Secondary | ICD-10-CM | POA: Insufficient documentation

## 2016-11-13 DIAGNOSIS — Z79899 Other long term (current) drug therapy: Secondary | ICD-10-CM | POA: Insufficient documentation

## 2016-11-13 DIAGNOSIS — Z87891 Personal history of nicotine dependence: Secondary | ICD-10-CM | POA: Diagnosis not present

## 2016-11-13 DIAGNOSIS — Z8481 Family history of carrier of genetic disease: Secondary | ICD-10-CM

## 2016-11-13 DIAGNOSIS — Z171 Estrogen receptor negative status [ER-]: Secondary | ICD-10-CM | POA: Insufficient documentation

## 2016-11-13 IMAGING — MG MM DIGITAL SCREENING BILAT W/ CAD
4 series · 4 of 4 positions shown · non-contrast
Comparison: Previous exam(s).

CLINICAL DATA: Screening.

EXAM:
DIGITAL SCREENING BILATERAL MAMMOGRAM WITH CAD

[R CC]
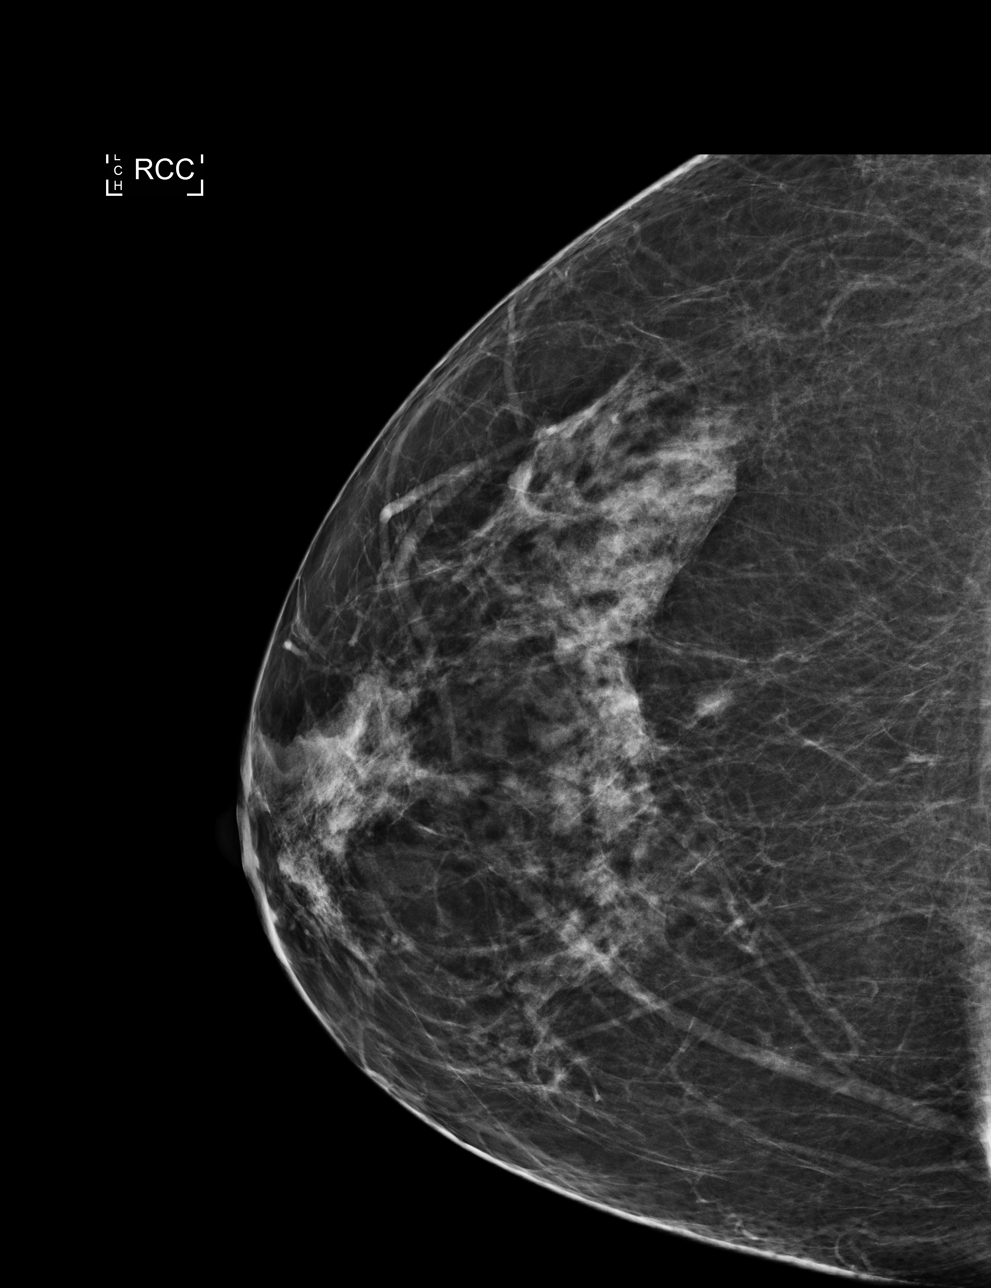

[R MLO]
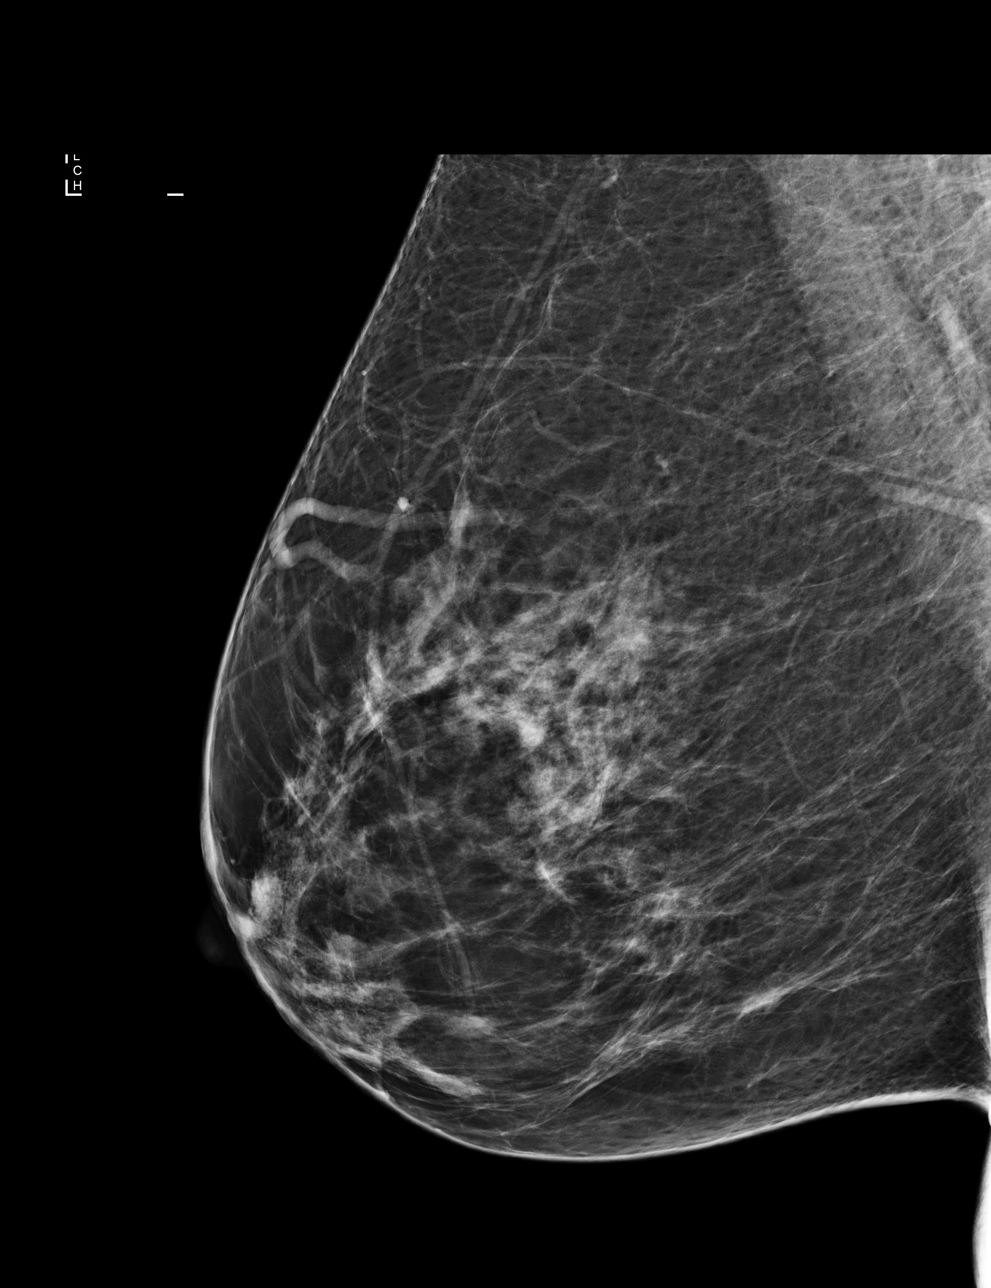

[L CC]
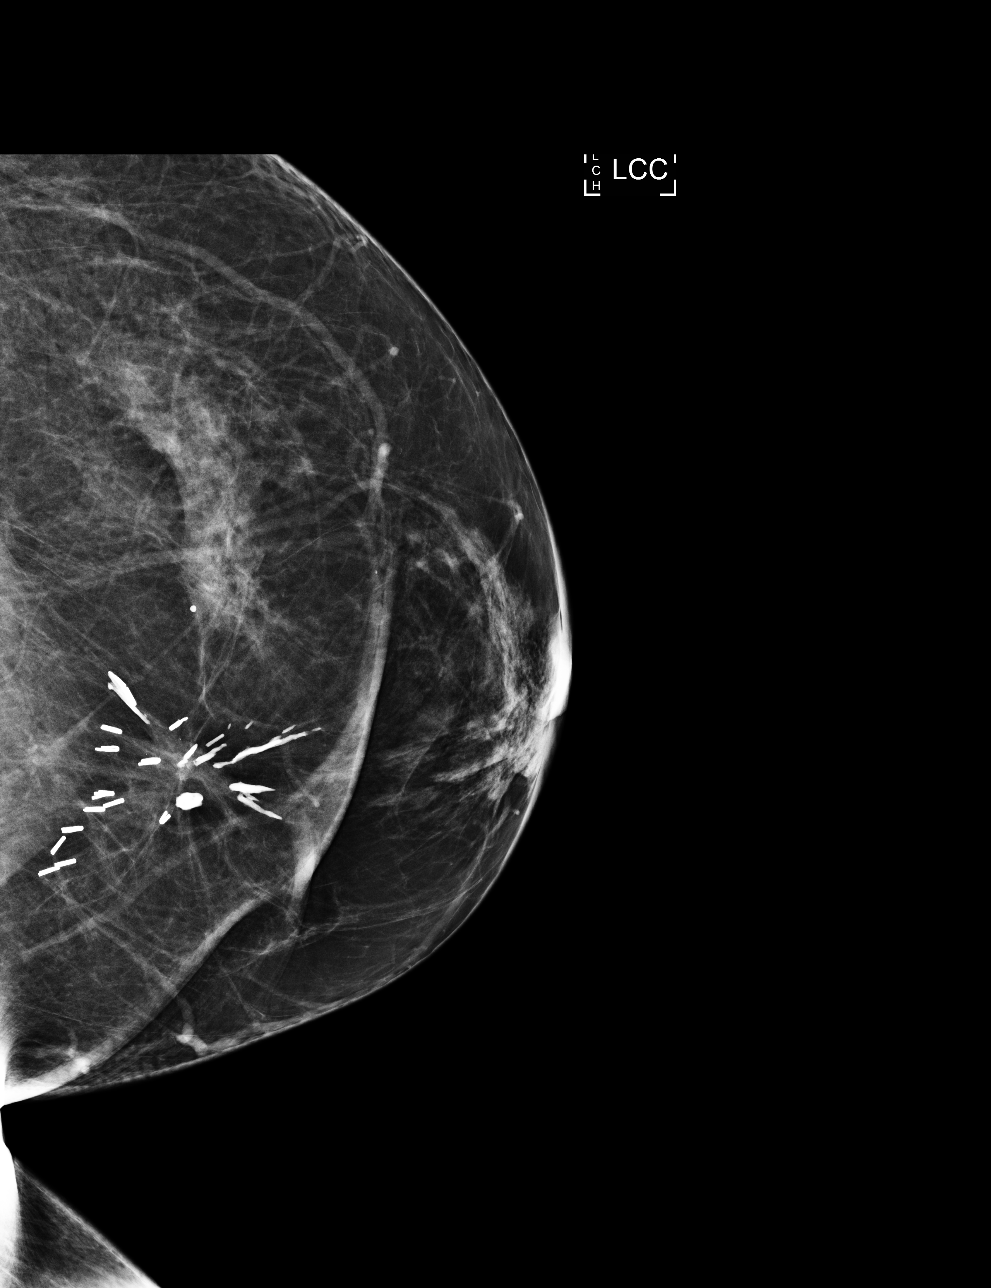

[L MLO]
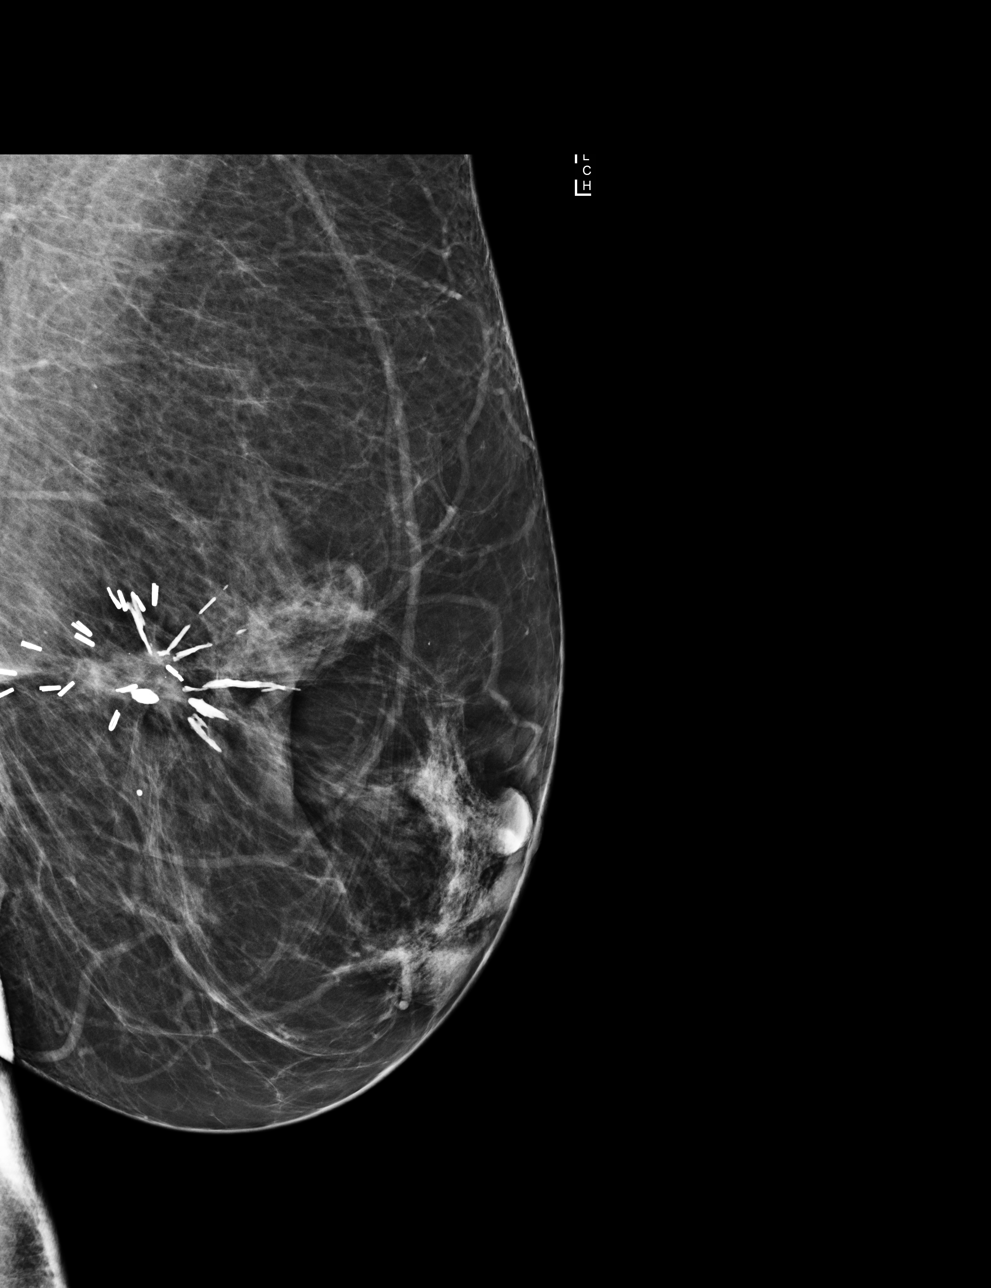

[4 of 4 positions shown; findings below may reference images not displayed]

ACR Breast Density Category b: There are scattered areas of
fibroglandular density.
FINDINGS: In the right breast, a possible asymmetry warrants further
evaluation. In the left breast, no findings suspicious for
malignancy. Images were processed with CAD.
IMPRESSION: Further evaluation is suggested for possible asymmetry in the right
breast.

RECOMMENDATION:
Diagnostic mammogram and possibly ultrasound of the right breast.
(Code:[id number]-E-YY3)

The patient will be contacted regarding the findings, and additional
imaging will be scheduled.

BI-RADS CATEGORY  0: Incomplete. Need additional imaging evaluation
and/or prior mammograms for comparison.

## 2016-11-13 NOTE — Progress Notes (Signed)
Patient here today for follow up.  Patient states no new concerns today  

## 2016-11-14 NOTE — Progress Notes (Signed)
Hematology/Oncology Consult note [facility]  [phone] [phone]  Patient Care Team: Kirk Ruths, MD as PCP - General (Internal Medicine) Kirk Ruths, MD (Internal Medicine) Bary Castilla Forest Gleason, MD (General Surgery)   Name of the patient: Amy Holland  [id number]  February 03, 1961   Date of visit: 11/14/16  Diagnosis- stage IA right breast cancer  Chief complaint/ Reason for visit- discuss results of genetic testing  Heme/Onc history: Amy Holland a 56 y.o.femalewith a history of bilateral breast cancerand a family history of BRCA1 mutation. She was diagnosed with stage I left breastcancer in 71 (age of 47). No pathology is available (patient reports hormone receptor negative). She underwent lumpectomy, followed by 5 cycles of Adriamycin and Cytoxan (AC) and radiation.   She had recurrent stage IA right breast cancer s/p wide excision and sentinel lymph node biopsy on 09/24/2015. Pathology revealed a 1.1 cm grade II invasive mammary carcinoma. There was no lymphovascular invasion. Margins were negative. Two lymph nodes were negative. ER was >90%, PR 11-50%, and Her2/neu 1+. Pathologic stage was T1cN0Mx.  She received 50.4 Gyfrom 10/28/2015 - 12/05/2015 to the right breast with a 14.4 Gy scar boost from 12/06/2015 - 12/18/2015.   Mamma[place] 09/24/2015 revealed low risk luminal-type A with a distant rate of recurrence in 5 years of 5% (CI: 1-9%) and in 10 years of 10% (CI: 4-15%).   She began [facility] 02/03/2016.Patient was noted to have osteopenia with a T score of -1.9 at the lumbar spine. 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 Denies any complaints. She is emotional and has been going through personal stressors for which she sees a therapist    Review of systems- Review of Systems  Constitutional: Negative for chills, fever, malaise/fatigue and weight loss.  HENT: Negative for congestion, ear discharge and nosebleeds.   Eyes: Negative for blurred vision.  Respiratory: Negative for cough, hemoptysis, sputum production, shortness of breath and wheezing.   Cardiovascular: Negative for chest pain, palpitations, orthopnea and claudication.  Gastrointestinal: Negative for abdominal pain, blood in stool, constipation, diarrhea, heartburn, melena, nausea and vomiting.  Genitourinary: Negative for dysuria, flank pain, frequency, hematuria and urgency.  Musculoskeletal: Negative for back pain, joint pain and myalgias.  Skin: Negative for rash.  Neurological: Negative for dizziness, tingling, focal weakness, seizures, weakness and headaches.  Endo/Heme/Allergies: Does not bruise/bleed easily.  Psychiatric/Behavioral: Negative for depression and suicidal ideas. The patient does not have insomnia.       Allergies  Allergen Reactions  . Letrozole Other (See Comments)    Patient states the side effects changed her quality of life while taking  . Latex Rash     Past Medical History:  Diagnosis Date  . Breast cancer ([place]) 1994   left lumpectomy with lymph node removal c Radiation and chemo ER/PR negative  . Breast cancer of lower-outer quadrant of right female breast ([place]) 09/24/2015   Right breast, 7 o'clock:11 mm, histologic grade 2, T1c, N0; ER/PR positive, HER-2/neu not overexpressed. Negative margins  . Colonoscopy refused    Cologuard negative by patient report.  . Depression   . Headache    migraine     Past Surgical History:  Procedure Laterality  Date  . BREAST LUMPECTOMY Left 1994   with lymph node removal Dr Amalia Hailey in [place]   . BREAST LUMPECTOMY  Right 09/24/2015   breast cancer Right breast, 7 o'clock:11 mm, histologic grade 2, T1c, N0; ER/PR positive, HER-2/neu not overexpressed. Negative margins  . ECTOPIC PREGNANCY SURGERY  1989  . PARTIAL MASTECTOMY WITH AXILLARY SENTINEL LYMPH NODE BIOPSY Right 09/24/2015   Procedure: PARTIAL MASTECTOMY WITH AXILLARY SENTINEL LYMPH NODE BIOPSY, WIDE EXCISION, RECONSTRUCTION/MASTOPLASTY;  Surgeon: Robert Bellow, MD;  Location: [facility] ORS;  Service: General;  Laterality: Right;    Social History   Social History  . Marital status: Divorced    Spouse name: N/A  . Number of children: N/A  . Years of education: N/A   Occupational History  . Not on file.   Social History Main Topics  . Smoking status: Former Smoker    Quit date: 06/19/1983  . Smokeless tobacco: Never Used  . Alcohol use 1.2 oz/week    2 Glasses of wine per week     Comment: 2 drinks per day  . Drug use: No  . Sexual activity: No   Other Topics Concern  . Not on file   Social History Narrative  . No narrative on file    Family History  Problem Relation Age of Onset  . Breast cancer Sister 3       BRCA positive.  . Breast cancer Sister 21       BRCA negative  . Ovarian cancer Mother      Current Outpatient Prescriptions:  .  fluticasone (FLONASE) 50 MCG/ACT nasal spray, Place 1 spray into both nostrils daily., Disp: , Rfl:  .  ibuprofen (GOODSENSE IBUPROFEN) 200 MG tablet, Take by mouth every 6 (six) hours as needed. , Disp: , Rfl:  .  loratadine (CLARITIN) 10 MG tablet, Take 10 mg by mouth daily., Disp: , Rfl:  .  Multiple Vitamin (MULTIVITAMIN) tablet, Take 1 tablet by mouth daily., Disp: , Rfl:  .  SUMAtriptan (IMITREX) 50 MG tablet, Take 50 mg by mouth every 2 (two) hours as needed for migraine. May repeat in 2 hours if headache persists or recurs., Disp: , Rfl:   Physical exam:  Vitals:   11/13/16 1047  BP: 130/78  Pulse: 83  Resp: 16  Temp: 98.2 F (36.8 C)  TempSrc: Tympanic  Weight: 140 lb 9  oz (63.8 kg)   Physical Exam  Constitutional: She is oriented to person, place, and time and well-developed, well-nourished, and in no distress.  HENT:  Head: Normocephalic and atraumatic.  Eyes: EOM are normal. Pupils are equal, round, and reactive to light.  Neck: Normal range of motion.  Cardiovascular: Normal rate, regular rhythm and normal heart sounds.   Pulmonary/Chest: Effort normal and breath sounds normal.  Abdominal: Soft. Bowel sounds are normal.  Neurological: She is alert and oriented to person, place, and time.  Skin: Skin is warm and dry.     CMP Latest Ref Rng & Units 10/01/2016  Glucose 65 - 99 mg/dL 88  BUN 6 - 24 mg/dL 15  Creatinine 0.57 - 1.00 mg/dL 0.77  Sodium 134 - 144 mmol/L 142  Potassium 3.5 - 5.2 mmol/L 4.1  Chloride 96 - 106 mmol/L 100  CO2 18 - 29 mmol/L 26  Calcium 8.7 - 10.2 mg/dL 10.8(H)  Total Protein 6.0 - 8.5 g/dL 7.7  Total Bilirubin 0.0 - 1.2 mg/dL 0.4  Alkaline Phos 39 - 117 IU/L 109  AST 0 - 40  IU/L 28  ALT 0 - 32 IU/L 27   CBC Latest Ref Rng & Units 10/13/2016  WBC 3.6 - 11.0 K/uL 4.2  Hemoglobin 12.0 - 16.0 g/dL 14.8  Hematocrit 35.0 - 47.0 % 42.2  Platelets 150 - 440 K/uL 195      Assessment and plan- Patient is a 56 y.o. female with h/o Stage 1 right breast cancer ER PR positive and her 2 neu negative   I discussed the results of genetic testing which show that she has bRCA1 mutation. 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 I discussed the results of the genetic testing with the patient in detail. Given that she has BRCA1 mutation, she has a lifetime risk of about 72% for breast cancer at 44% for ovarian cancer. Patient has already had 2 separate breast cancers in the past and she is not currently on hormone therapy. I strongly advised her to consider prophylactic bilateral mastectomy as well as bilateral salpingo-oophorectomy. Patient states that she was expecting these results but is quite emotional and going  through family stressors and is not sure if she is ready to go through his procedure yet. She does see Dr. Bary Castilla and also has GYN. She wants to think about all this and we will give her a call in about 2 weeks' time. She is beginning to see a Dietitian and I will refer her to [place] genetic counseling. I again stressed the importance of taking hormone therapy but she continues to decline at this time. I will see her back in 4-5 months time as scheduled. I also discussed the potential repercussions of BRCA1 mutation for her children. She will discuss this with them as well and discuss this further with her genetic counselling session at [facility]   Visit Diagnosis 1. Malignant neoplasm of lower-outer quadrant of right breast of female, estrogen receptor positive ([place])   2. History of left breast cancer   3. Family history of BRCA1 gene positive      Dr. Randa Evens, MD, MPH [facility] at [facility] Pager- [phone] 11/14/2016 5:50 PM

## 2016-11-20 ENCOUNTER — Encounter: Payer: Self-pay | Admitting: Oncology

## 2016-11-23 ENCOUNTER — Ambulatory Visit: Payer: Self-pay | Admitting: *Deleted

## 2016-11-23 VITALS — BP 125/90 | HR 93

## 2016-11-23 DIAGNOSIS — I10 Essential (primary) hypertension: Secondary | ICD-10-CM

## 2016-11-23 DIAGNOSIS — Z0130 Encounter for examination of blood pressure without abnormal findings: Secondary | ICD-10-CM

## 2016-12-03 IMAGING — MG MM DIGITAL DIAGNOSTIC UNILAT*R*
2 series · 2 of 2 positions shown · non-contrast
Comparison: Previous exam(s).

CLINICAL DATA: Post right breast ultrasound-guided biopsy.

EXAM:
DIAGNOSTIC RIGHT MAMMOGRAM POST ULTRASOUND BIOPSY

[R ML]
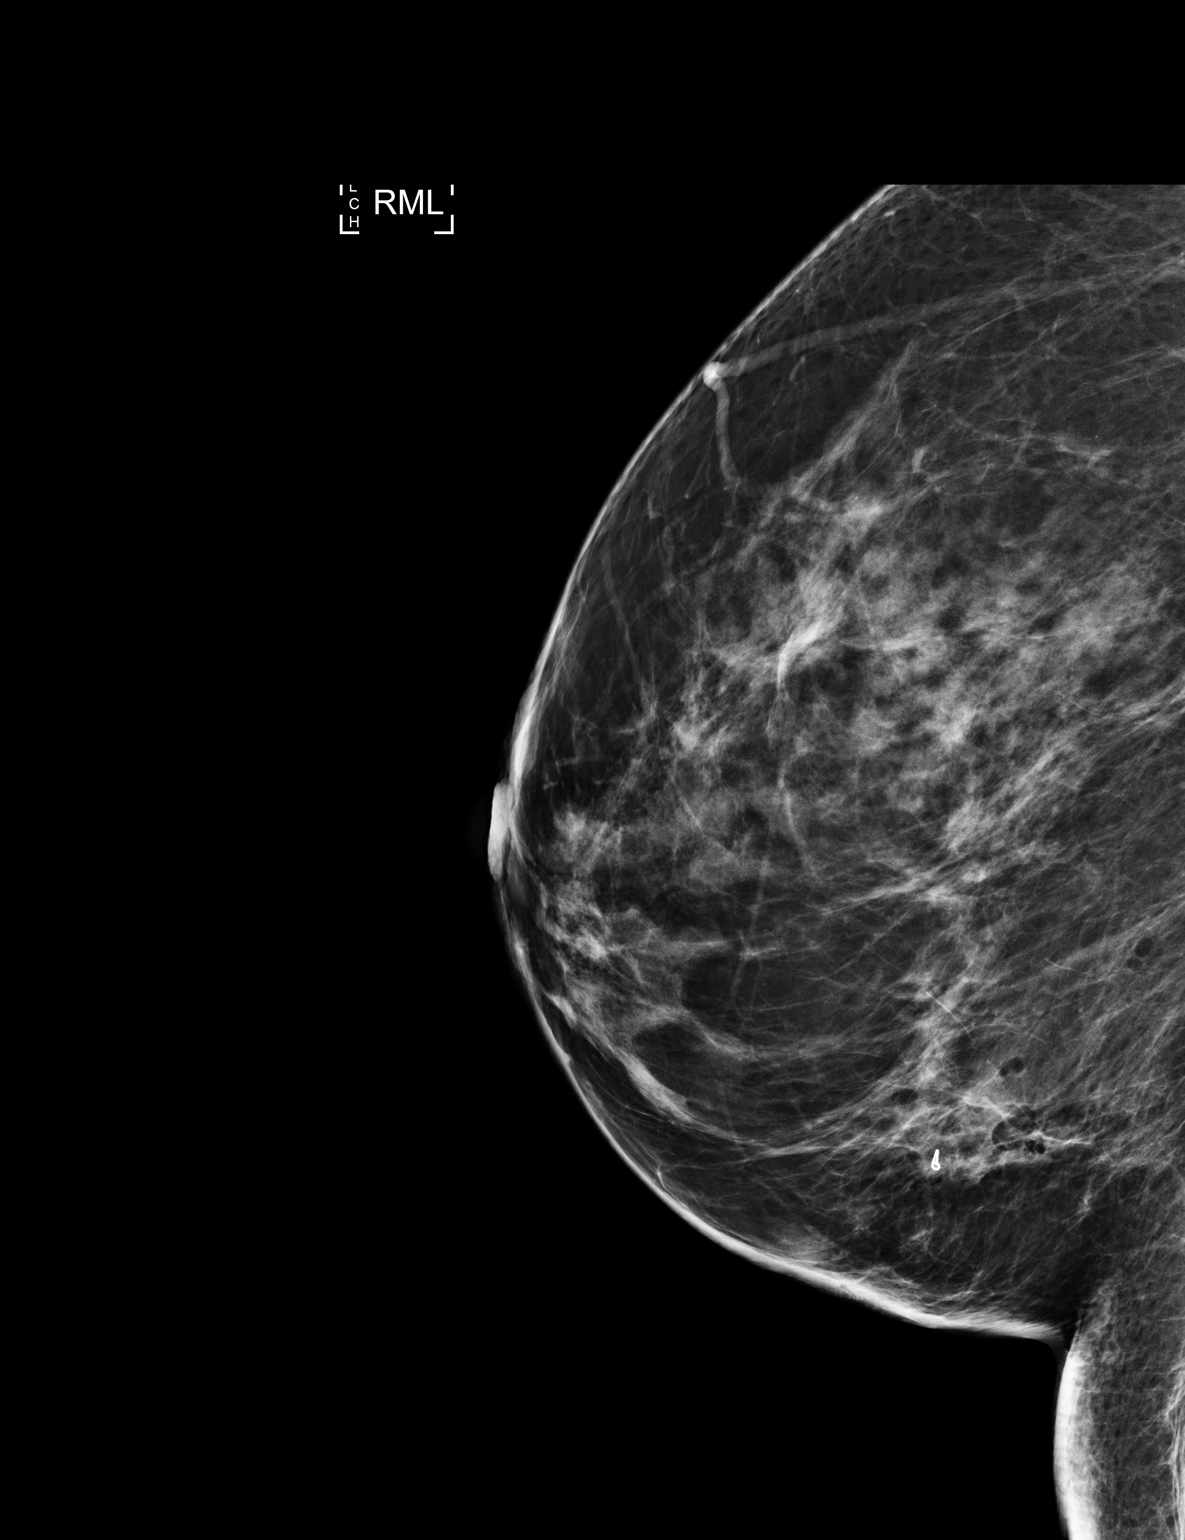

[R CC]
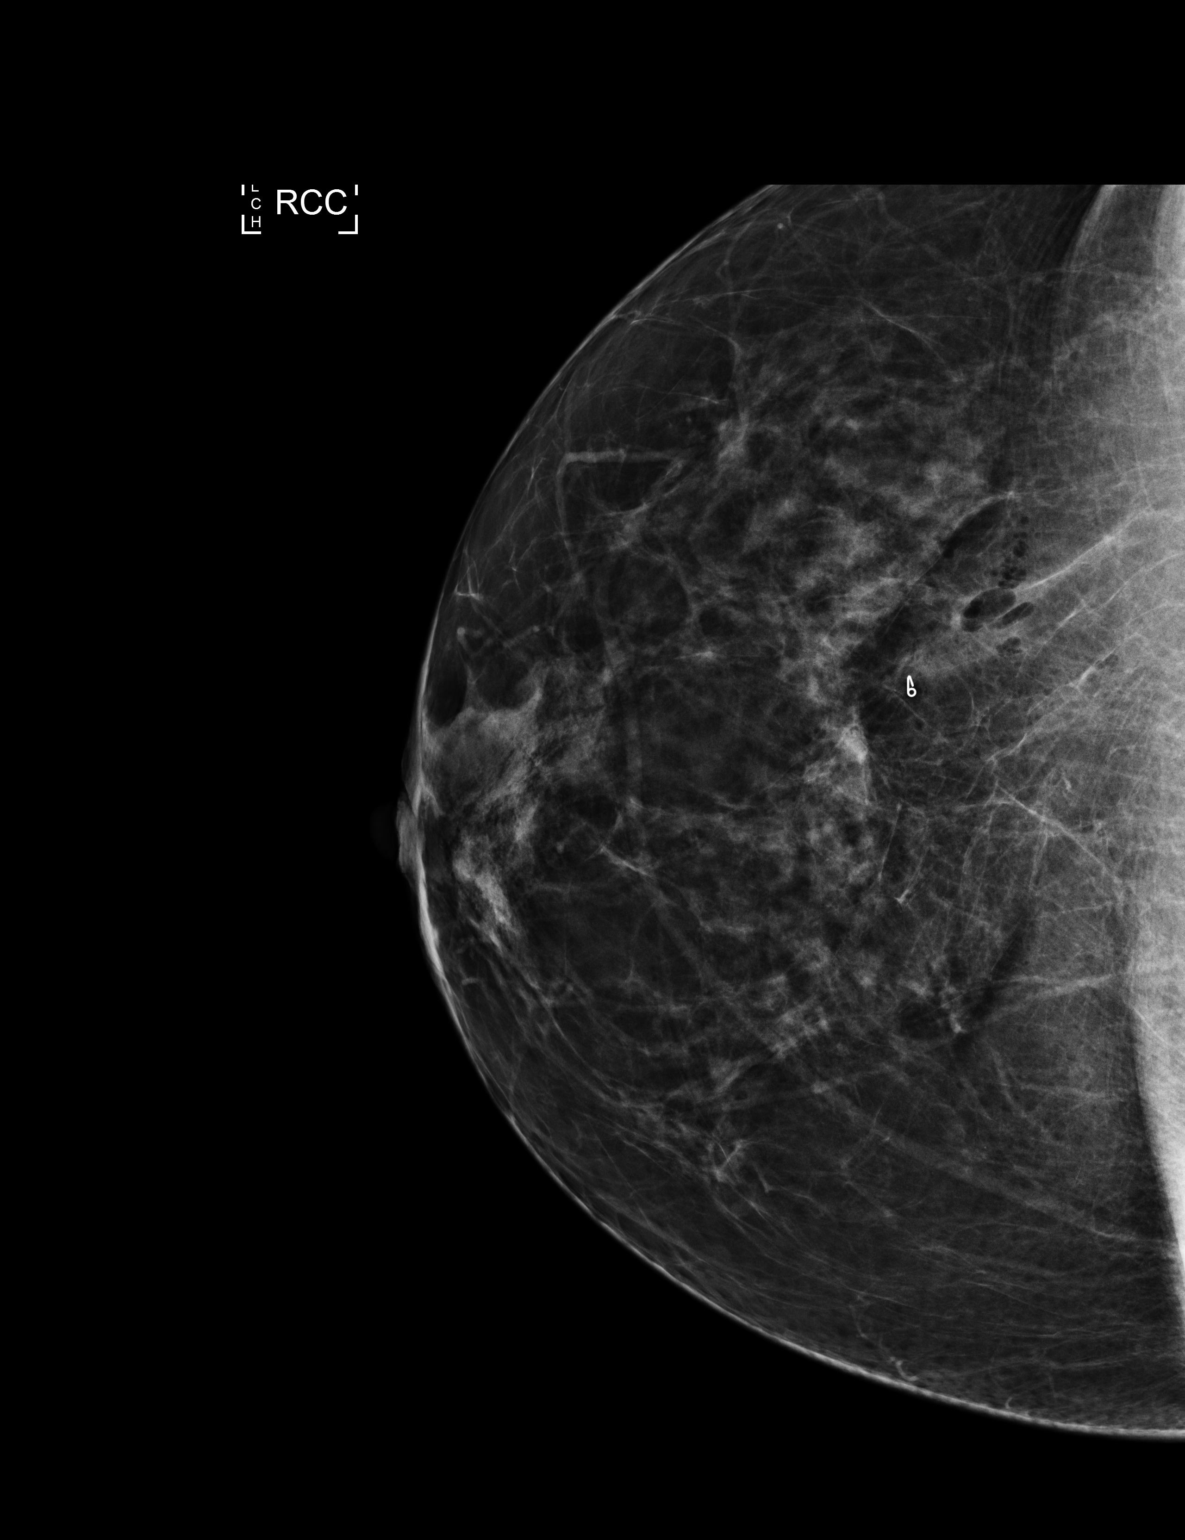

[2 of 2 positions shown; findings below may reference images not displayed]

FINDINGS: Mammographic images were obtained following ultrasound guided biopsy
of the small mass located within the right breast at the 7 o'clock
position 5 cm from the nipple. The wing shaped clip is in
appropriate position.
IMPRESSION: Appropriate position of clip following right breast
ultrasound-guided biopsy

Final Assessment: Post Procedure Mammograms for Marker Placement

## 2016-12-08 NOTE — Telephone Encounter (Signed)
done

## 2016-12-21 ENCOUNTER — Ambulatory Visit: Payer: Self-pay | Admitting: *Deleted

## 2016-12-21 VITALS — BP 124/84 | HR 76

## 2016-12-21 DIAGNOSIS — Z0130 Encounter for examination of blood pressure without abnormal findings: Secondary | ICD-10-CM

## 2016-12-24 ENCOUNTER — Encounter: Payer: Self-pay | Admitting: Oncology

## 2017-01-04 ENCOUNTER — Ambulatory Visit: Payer: Self-pay | Admitting: *Deleted

## 2017-01-04 VITALS — BP 136/94 | HR 84

## 2017-01-04 DIAGNOSIS — I10 Essential (primary) hypertension: Secondary | ICD-10-CM

## 2017-01-04 NOTE — Progress Notes (Signed)
Weekly BP check.  

## 2017-01-08 ENCOUNTER — Ambulatory Visit: Payer: Self-pay | Admitting: *Deleted

## 2017-01-08 VITALS — BP 140/86 | HR 81 | Ht 64.0 in | Wt 141.0 lb

## 2017-01-08 DIAGNOSIS — Z0000 Encounter for general adult medical examination without abnormal findings: Secondary | ICD-10-CM

## 2017-01-08 NOTE — Progress Notes (Signed)
[employer] insurance premium discount evaluation: Labs Drawn. [employer] ROI form signed. Tobacco Free Attestation form signed.  Forms placed in paper chart.  Okay to route results to PCP per pt.

## 2017-01-09 LAB — CMP12+LP+TP+TSH+6AC+CBC/D/PLT
ALT: 31 IU/L (ref 0–32)
AST: 29 IU/L (ref 0–40)
Albumin/Globulin Ratio: 1.9 (ref 1.2–2.2)
Albumin: 5 g/dL (ref 3.5–5.5)
Alkaline Phosphatase: 116 IU/L (ref 39–117)
BASOS: 1 %
BILIRUBIN TOTAL: 0.4 mg/dL (ref 0.0–1.2)
BUN/Creatinine Ratio: 22 (ref 9–23)
BUN: 17 mg/dL (ref 6–24)
Basophils Absolute: 0.1 10*3/uL (ref 0.0–0.2)
CALCIUM: 10.1 mg/dL (ref 8.7–10.2)
CHLORIDE: 103 mmol/L (ref 96–106)
CREATININE: 0.79 mg/dL (ref 0.57–1.00)
Chol/HDL Ratio: 2.4 ratio (ref 0.0–4.4)
Cholesterol, Total: 233 mg/dL — ABNORMAL HIGH (ref 100–199)
EOS (ABSOLUTE): 0.1 10*3/uL (ref 0.0–0.4)
Eos: 3 %
Free Thyroxine Index: 1.7 (ref 1.2–4.9)
GFR calc Af Amer: 95 mL/min/{1.73_m2} (ref >59)
GFR, EST NON AFRICAN AMERICAN: 83 mL/min/{1.73_m2} (ref >59)
GGT: 32 IU/L (ref 0–60)
GLUCOSE: 88 mg/dL (ref 65–99)
Globulin, Total: 2.7 g/dL (ref 1.5–4.5)
HDL: 98 mg/dL (ref >39)
HEMATOCRIT: 44.2 % (ref 34.0–46.6)
HEMOGLOBIN: 15.4 g/dL (ref 11.1–15.9)
IRON: 77 ug/dL (ref 27–159)
Immature Grans (Abs): 0 10*3/uL (ref 0.0–0.1)
Immature Granulocytes: 0 %
LDH: 197 IU/L (ref 119–226)
LDL Calculated: 123 mg/dL — ABNORMAL HIGH (ref 0–99)
LYMPHS ABS: 1 10*3/uL (ref 0.7–3.1)
Lymphs: 25 %
MCH: 31.6 pg (ref 26.6–33.0)
MCHC: 34.8 g/dL (ref 31.5–35.7)
MCV: 91 fL (ref 79–97)
Monocytes Absolute: 0 10*3/uL — ABNORMAL LOW (ref 0.1–0.9)
Monocytes: 1 %
Neutrophils Absolute: 2.9 10*3/uL (ref 1.4–7.0)
Neutrophils: 70 %
PHOSPHORUS: 2.7 mg/dL (ref 2.5–4.5)
PLATELETS: 216 10*3/uL (ref 150–379)
Potassium: 4.1 mmol/L (ref 3.5–5.2)
RBC: 4.87 x10E6/uL (ref 3.77–5.28)
RDW: 13.1 % (ref 12.3–15.4)
SODIUM: 143 mmol/L (ref 134–144)
T3 UPTAKE RATIO: 25 % (ref 24–39)
T4, Total: 6.8 ug/dL (ref 4.5–12.0)
TOTAL PROTEIN: 7.7 g/dL (ref 6.0–8.5)
TSH: 1.18 u[IU]/mL (ref 0.450–4.500)
Triglycerides: 60 mg/dL (ref 0–149)
URIC ACID: 5.6 mg/dL (ref 2.5–7.1)
VLDL CHOLESTEROL CAL: 12 mg/dL (ref 5–40)
WBC: 4.1 10*3/uL (ref 3.4–10.8)

## 2017-01-09 LAB — HGB A1C W/O EAG: Hgb A1c MFr Bld: 5.8 % — ABNORMAL HIGH (ref 4.8–5.6)

## 2017-01-11 DIAGNOSIS — F325 Major depressive disorder, single episode, in full remission: Secondary | ICD-10-CM | POA: Insufficient documentation

## 2017-01-12 NOTE — Progress Notes (Signed)
Results reviewed with pt 01/11/17. Cholesterol and LDL elevated, worsened from previous. A1c elevated, stable from previous. Diet and exercise recommendations for general health given. Results routed to pcp earlier in morning as pt was scheduled to see him for f/u HTN appt today. Copy provided to pt.

## 2017-01-18 ENCOUNTER — Ambulatory Visit: Payer: Self-pay | Admitting: *Deleted

## 2017-01-18 VITALS — BP 138/82

## 2017-01-18 DIAGNOSIS — I10 Essential (primary) hypertension: Secondary | ICD-10-CM

## 2017-04-08 ENCOUNTER — Inpatient Hospital Stay: Payer: No Typology Code available for payment source | Admitting: Oncology

## 2017-05-23 IMAGING — US US BREAST LTD UNI RIGHT INC AXILLA
1 series · 11 of 11 positions shown · non-contrast
Comparison: Previous exam(s).

CLINICAL DATA: Right breast lower outer quadrant focal asymmetry
seen on most recent screening mammography. History of treated left
breast cancer, status post lumpectomy in 1885.

EXAM:
DIGITAL DIAGNOSTIC RIGHT MAMMOGRAM WITH 3D TOMOSYNTHESIS WITH CAD
ULTRASOUND RIGHT BREAST

[Series 1: us breast ltd uni right inc axilla · 0.06mm/px · 11 of 11 slices shown]
[im 1/11]
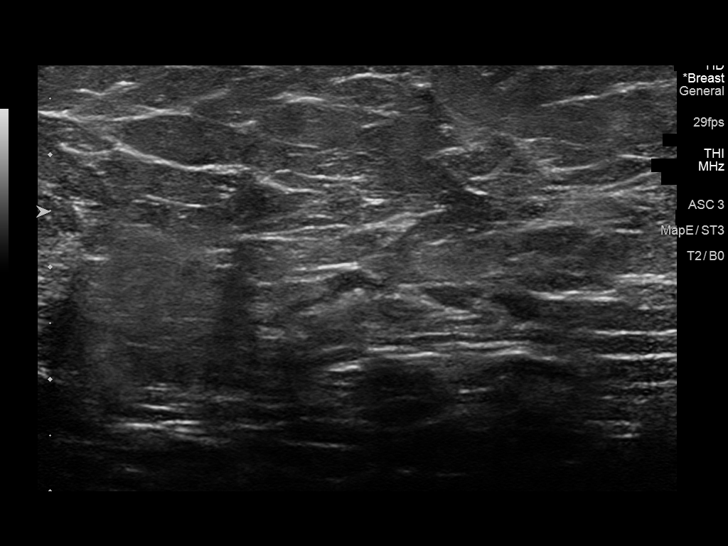
[im 2/11]
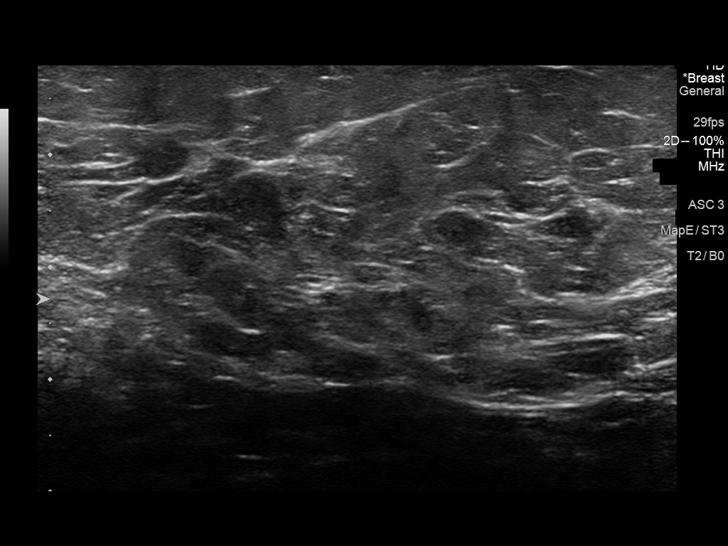
[im 3/11]
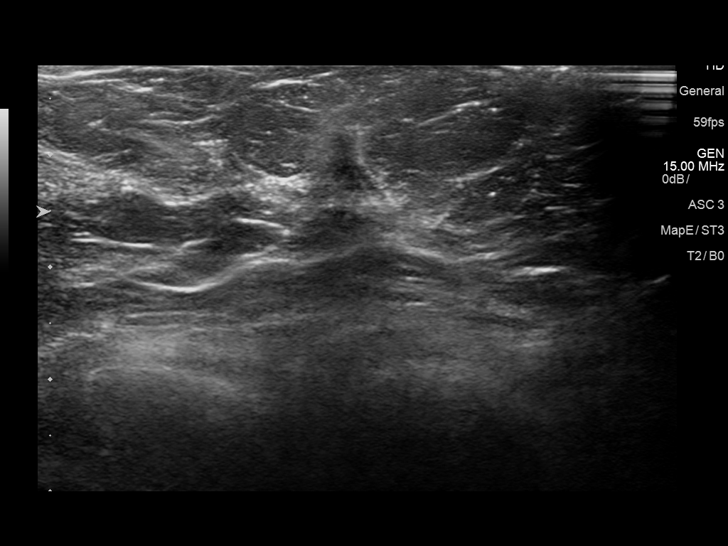
[im 4/11]
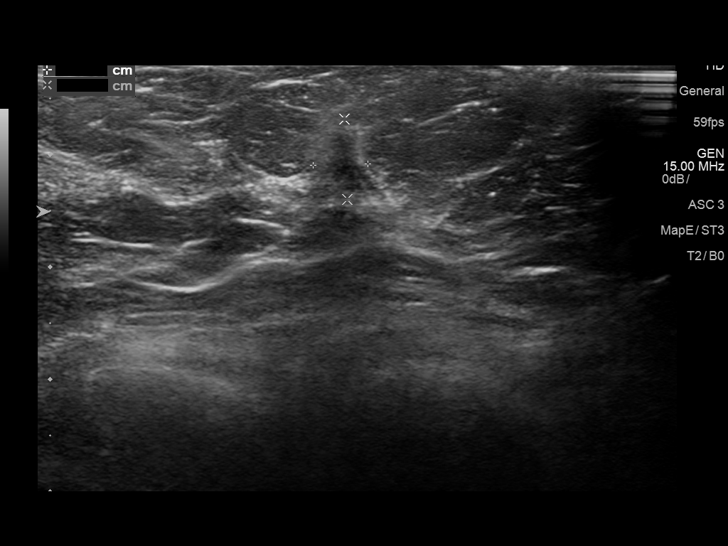
[im 5/11]
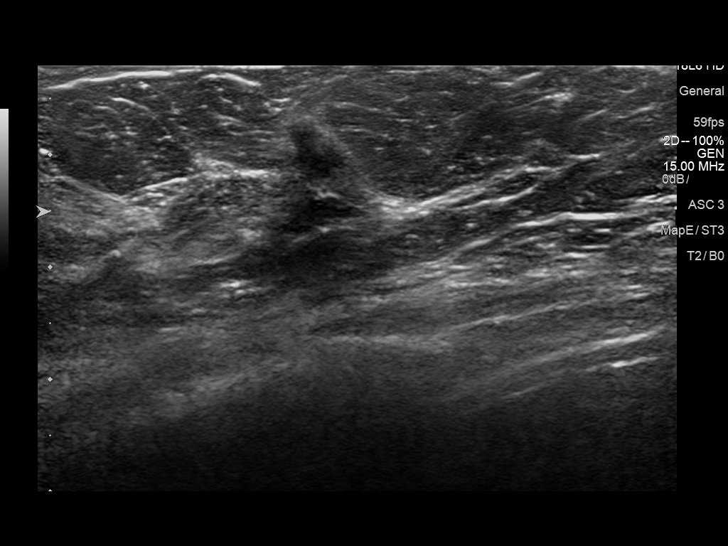
[im 6/11]
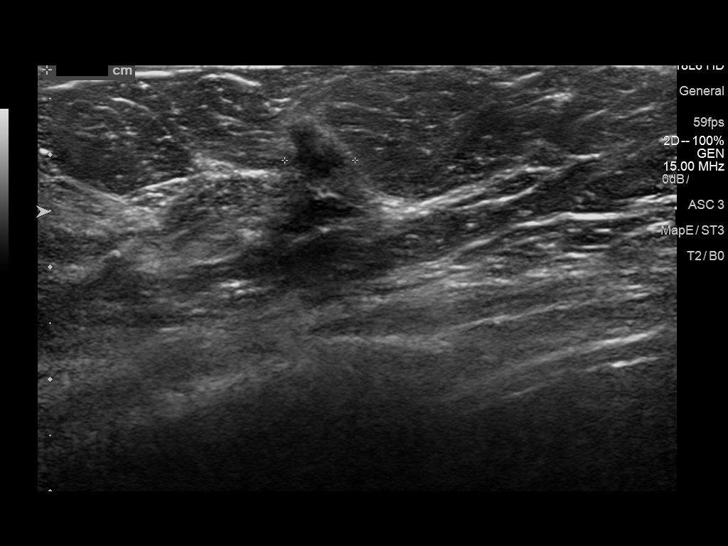
[im 7/11]
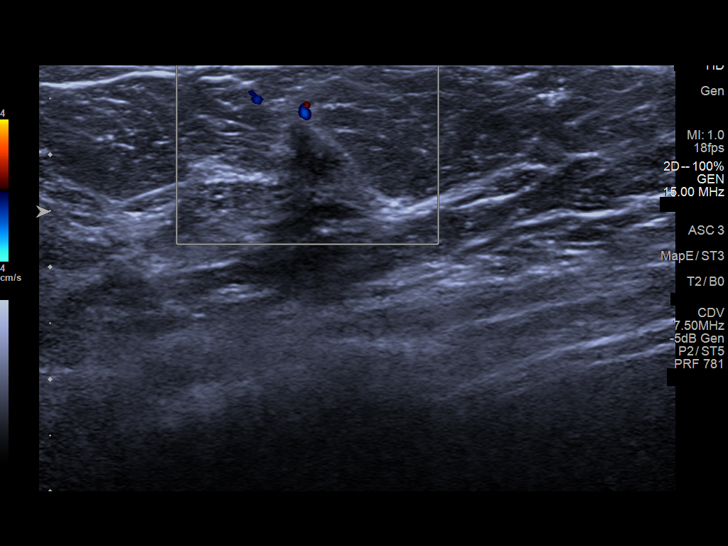
[im 8/11]
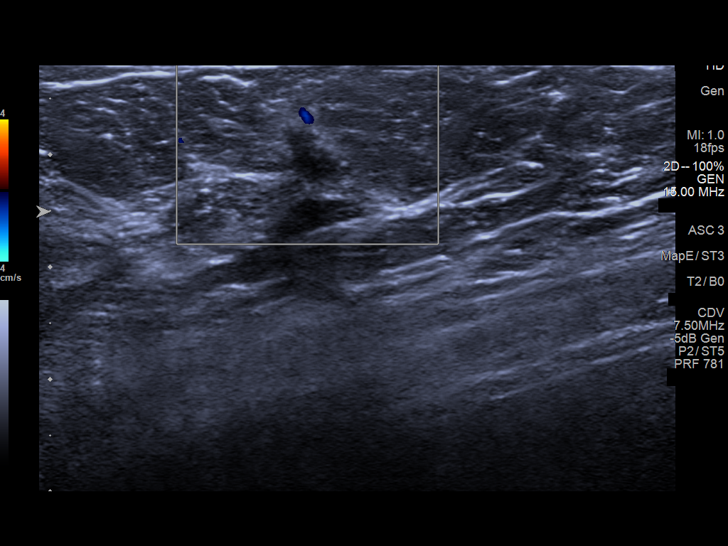
[im 9/11]
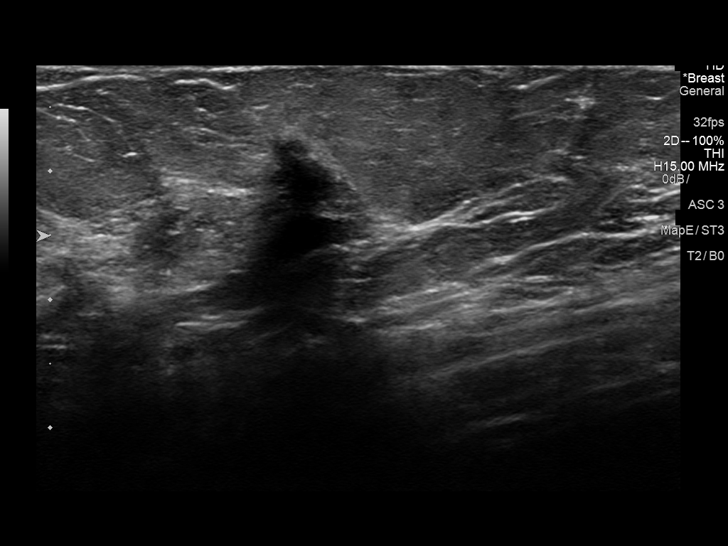
[im 10/11]
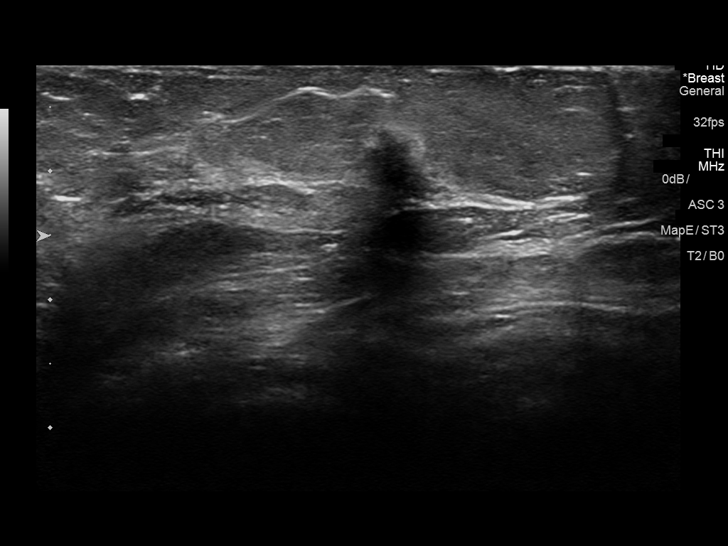
[im 11/11]
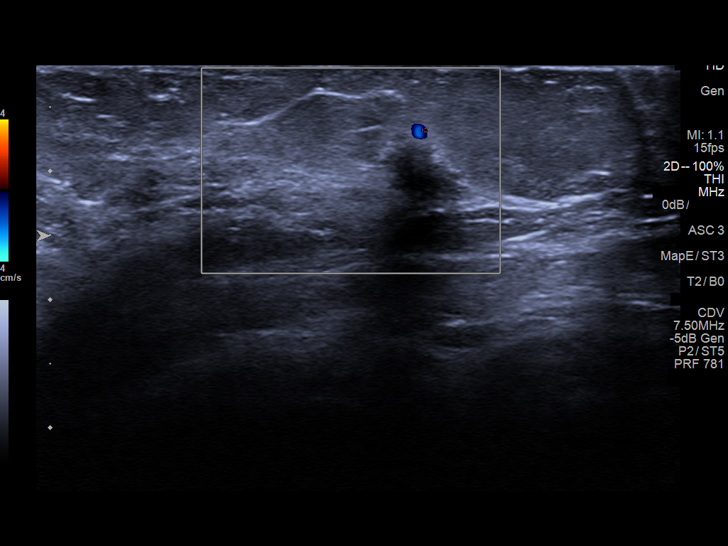

[11 of 11 positions shown; findings below may reference images not displayed]

ACR Breast Density Category c: The breast tissue is heterogeneously
dense, which may obscure small masses.
FINDINGS: Additional mammographic views of the right breast demonstrate
persistent sub cm nodule in the lower outer quadrant, middle depth.

Mammographic images were processed with CAD.

On physical exam, no suspicious masses are found.

Targeted ultrasound is performed, showing right breast 7 o'clock 4
cm from the nipple irregular hypoechoic solid nodule measuring 0.6 x
0.5 x 0.7 cm.
IMPRESSION: Suspicious 7 mm right breast 7 o'clock nodule, for which
ultrasound-guided core needle biopsy is recommended.

RECOMMENDATION:
Ultrasound-guided core needle biopsy of the right breast.

I have discussed the findings and recommendations with the patient.
Results were also provided in writing at the conclusion of the
visit. If applicable, a reminder letter will be sent to the patient
regarding the next appointment.

BI-RADS CATEGORY  4: Suspicious.

## 2017-07-15 ENCOUNTER — Other Ambulatory Visit: Payer: Self-pay

## 2017-07-15 ENCOUNTER — Ambulatory Visit
Admission: RE | Admit: 2017-07-15 | Discharge: 2017-07-15 | Disposition: A | Discharge status: Home / Self Care | Payer: No Typology Code available for payment source | Source: Ambulatory Visit | Attending: Radiation Oncology | Admitting: Radiation Oncology

## 2017-07-15 VITALS — BP 133/69 | HR 86 | Temp 96.5°F | Resp 18 | Wt 143.2 lb

## 2017-07-15 DIAGNOSIS — C50511 Malignant neoplasm of lower-outer quadrant of right female breast: Secondary | ICD-10-CM

## 2017-07-15 DIAGNOSIS — Z1501 Genetic susceptibility to malignant neoplasm of breast: Secondary | ICD-10-CM | POA: Diagnosis not present

## 2017-07-15 DIAGNOSIS — Z923 Personal history of irradiation: Secondary | ICD-10-CM | POA: Insufficient documentation

## 2017-07-15 DIAGNOSIS — Z170 Estrogen receptor positive status [ER+]: Secondary | ICD-10-CM

## 2017-07-15 DIAGNOSIS — Z853 Personal history of malignant neoplasm of breast: Secondary | ICD-10-CM | POA: Diagnosis present

## 2017-07-15 NOTE — Progress Notes (Signed)
Radiation Oncology Follow up Note  Name: Amy Holland   Date:   07/15/2017 MRN:  [id number] DOB: 09/29/1960    This 57 y.o. 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 She is seen today in routine follow-up and is doing well.Marland Kitchen She was recently diagnosed withhas bRCA1 mutation. 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 Patient has discontinued antiestrogen therapy based on side effect profile. Patient is considering oophorectomy. She specifically denies breast tenderness cough or bone pain. Her last mammograms which I have reviewed will back in April BI-RADS 2 benign.    COMPLICATIONS OF TREATMENT: none  FOLLOW UP COMPLIANCE: keeps appointments   PHYSICAL EXAM:  BP 133/69   Pulse 86   Temp (!) 96.5 F (35.8 C)   Resp 18   Wt 143 lb 3 oz (64.9 kg)   BMI 24.58 kg/m  Lungs are clear to A&P cardiac examination essentially unremarkable with regular rate and rhythm. No dominant mass or nodularity is noted in either breast in 2 positions examined. Incision is well-healed. No axillary or supraclavicular adenopathy is appreciated. Cosmetic result is excellent. Well-developed well-nourished patient in NAD. HEENT reveals PERLA, EOMI, discs not visualized.  Oral cavity is clear. No oral mucosal lesions are identified. Neck is clear without evidence of cervical or supraclavicular adenopathy. Lungs are clear to A&P. Cardiac examination is essentially unremarkable with regular rate and rhythm without murmur rub or thrill. Abdomen is benign with no organomegaly or masses noted. Motor sensory and DTR levels are equal and  symmetric in the upper and lower extremities. Cranial nerves II through XII are grossly intact. Proprioception is intact. No peripheral adenopathy or edema is identified. No motor or sensory levels are noted. Crude visual fields are within normal range.  RADIOLOGY RESULTS: Mammograms are reviewed and compatible with the above-stated findings  PLAN: Present time she is doing well with no evidence of disease. She is BRCA1 positive and is making clinical decisions regarding possible nephrectomy. Otherwise I'm please were overall progress. She scheduled for follow-up mammograms in April. She continues close follow-up care with medical oncology. I have asked to see her back in 1 year for follow-up.  I would like to take this opportunity to thank you for allowing me to participate in the care of your patient.Noreene Filbert, MD

## 2017-08-09 ENCOUNTER — Other Ambulatory Visit: Payer: Self-pay

## 2017-08-09 DIAGNOSIS — Z170 Estrogen receptor positive status [ER+]: Principal | ICD-10-CM

## 2017-08-09 DIAGNOSIS — C50511 Malignant neoplasm of lower-outer quadrant of right female breast: Secondary | ICD-10-CM

## 2017-08-10 DIAGNOSIS — R7303 Prediabetes: Secondary | ICD-10-CM | POA: Insufficient documentation

## 2017-08-10 DIAGNOSIS — R7309 Other abnormal glucose: Secondary | ICD-10-CM | POA: Insufficient documentation

## 2017-09-08 ENCOUNTER — Ambulatory Visit
Admission: RE | Admit: 2017-09-08 | Discharge: 2017-09-08 | Disposition: A | Discharge status: Home / Self Care | Payer: No Typology Code available for payment source | Source: Ambulatory Visit | Attending: General Surgery | Admitting: General Surgery

## 2017-09-08 DIAGNOSIS — Z853 Personal history of malignant neoplasm of breast: Secondary | ICD-10-CM | POA: Diagnosis present

## 2017-09-08 DIAGNOSIS — C50511 Malignant neoplasm of lower-outer quadrant of right female breast: Secondary | ICD-10-CM

## 2017-09-08 DIAGNOSIS — Z170 Estrogen receptor positive status [ER+]: Principal | ICD-10-CM

## 2017-09-08 DIAGNOSIS — Z1501 Genetic susceptibility to malignant neoplasm of breast: Secondary | ICD-10-CM | POA: Insufficient documentation

## 2017-09-08 HISTORY — DX: Personal history of irradiation: Z92.3

## 2017-09-08 HISTORY — DX: Personal history of antineoplastic chemotherapy: Z92.21

## 2017-09-09 ENCOUNTER — Telehealth: Payer: Self-pay | Admitting: Registered Nurse

## 2017-09-09 ENCOUNTER — Encounter: Payer: Self-pay | Admitting: Registered Nurse

## 2017-09-09 DIAGNOSIS — J301 Allergic rhinitis due to pollen: Secondary | ICD-10-CM | POA: Insufficient documentation

## 2017-09-09 MED ORDER — FLUTICASONE PROPIONATE 50 MCG/ACT NA SUSP: 1.0000 | Freq: Two times a day (BID) | NASAL | 6 refills | Status: DC

## 2017-09-09 NOTE — Telephone Encounter (Signed)
Patient requested refill on flonase 50mcg 1 spray each nostril BID.  DIscussed with patient still on backorder for formulary she requested electronic Rx to [place].  Rx sent #1 [id number] Patient to pick up later today.  Patient verbalized understanding information/instructions, agreed with plan of care and had no further questions at this time

## 2017-09-21 ENCOUNTER — Encounter: Payer: Self-pay | Admitting: General Surgery

## 2017-09-21 ENCOUNTER — Ambulatory Visit: Payer: PRIVATE HEALTH INSURANCE | Admitting: General Surgery

## 2017-09-21 VITALS — BP 104/76 | HR 75 | Resp 12 | Ht 64.0 in | Wt 146.0 lb

## 2017-09-21 DIAGNOSIS — Z170 Estrogen receptor positive status [ER+]: Secondary | ICD-10-CM | POA: Diagnosis not present

## 2017-09-21 DIAGNOSIS — C50511 Malignant neoplasm of lower-outer quadrant of right female breast: Secondary | ICD-10-CM | POA: Diagnosis not present

## 2017-09-21 NOTE — Patient Instructions (Addendum)
The patient is aware to call back for any questions or new concerns. The patient has been asked to return to the office in one year with a bilateral diagnostic mammogram.  Discussed MRI breast, she will call back if she decides to have this completed.

## 2017-09-21 NOTE — Progress Notes (Signed)
Patient ID: Amy Holland, female   DOB: 06/11/1960, 57 y.o.   MRN: [id number]  Chief Complaint  Patient presents with  . Follow-up    HPI Amy Holland is a 57 y.o. female who presents for a breast evaluation. The most recent mammogram was done on 09/08/2017 .  Patient does perform regular self breast checks and gets regular mammograms done.   No new breast issues. She did test positive for BRCA 1 in May 2018. She does have an upcoming appointment with Dr Beasley. She works at [employer].  HPI  Past Medical History:  Diagnosis Date  . 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 Breast cancer ([facility]) 1994   left lumpectomy with lymph node removal c Radiation and chemo ER/PR negative, node negative  . Breast cancer of lower-outer quadrant of right female breast ([facility]) 09/24/2015   Right breast, 7 o'clock:11 mm, histologic grade 2, T1c, N0; ER/PR positive, HER-2/neu not overexpressed. Negative margins  . Colonoscopy refused    Cologuard negative by patient report.  . Depression   . Headache    migraine  . Personal history of chemotherapy 1994   left breast ca  . Personal history of radiation therapy 1994   left breast ca  . Personal history of radiation therapy 2017   right breast ca    Past Surgical History:  Procedure Laterality Date  . BREAST BIOPSY Right 08/12/2015   invasive mammary carcinoma  . BREAST LUMPECTOMY Left 1994   with lymph node removal Dr Evans in [place]   . BREAST LUMPECTOMY Right 09/24/2015   breast cancer Right breast, 7 o'clock:11 mm, histologic grade 2, T1c, N0; ER/PR positive, HER-2/neu not overexpressed. Negative margins  LN negative  . ECTOPIC PREGNANCY SURGERY  1989  . PARTIAL MASTECTOMY WITH AXILLARY SENTINEL LYMPH NODE BIOPSY Right 09/24/2015   Procedure: PARTIAL MASTECTOMY WITH AXILLARY SENTINEL LYMPH NODE BIOPSY, WIDE EXCISION, RECONSTRUCTION/MASTOPLASTY;  Surgeon: Jeffrey W Byrnett, MD;  Location: [facility] ORS;  Service: General;  Laterality: Right;     Family History  Problem Relation Age of Onset  . Breast cancer Sister 36       BRCA1 positive.  . Breast cancer Sister 34       BRCA negative  . Ovarian cancer Mother     Social History Social History   Tobacco Use  . Smoking status: Former Smoker    Last attempt to quit: 06/19/1983    Years since quitting: 34.2  . Smokeless tobacco: Never Used  Substance Use Topics  . Alcohol use: Yes    Alcohol/week: 1.2 oz    Types: 2 Glasses of wine per week    Comment: 2 drinks per day  . Drug use: No    Allergies  Allergen Reactions  . Letrozole Other (See Comments)    Patient states the side effects changed her quality of life while taking  . Latex Rash    Current Outpatient Medications  Medication Sig Dispense Refill  . cetirizine (ZYRTEC) 10 MG tablet Take 10 mg by mouth daily.    . Cholecalciferol (VITAMIN D3) 2000 units capsule Take by mouth.    . escitalopram (LEXAPRO) 20 MG tablet Take 20 mg by mouth daily.    . fluticasone (FLONASE) 50 MCG/ACT nasal spray Place 1 spray into both nostrils 2 (two) times daily. 16 g 6  . ibuprofen (GOODSENSE IBUPROFEN) 200 MG tablet Take by mouth every 6 (six) hours as needed.     . Multiple Vitamin (MULTIVITAMIN) tablet Take 1 tablet by mouth   daily.    . SUMAtriptan (IMITREX) 50 MG tablet Take 50 mg by mouth every 2 (two) hours as needed for migraine. May repeat in 2 hours if headache persists or recurs.     No current facility-administered medications for this visit.     Review of Systems Review of Systems  Constitutional: Negative.   Respiratory: Negative.   Cardiovascular: Negative.     Blood pressure 104/76, pulse 75, resp. rate 12, height 5' 4" (1.626 m), weight 146 lb (66.2 kg), SpO2 98 %.  Physical Exam Physical Exam  Constitutional: She is oriented to person, place, and time. She appears well-developed and well-nourished.  HENT:  Mouth/Throat: Oropharynx is clear and moist.  Eyes: Conjunctivae are normal. No scleral  icterus.  Neck: Neck supple.  Cardiovascular: Normal rate, regular rhythm and normal heart sounds.  Pulmonary/Chest: Effort normal and breath sounds normal. Right breast exhibits no inverted nipple, no mass, no nipple discharge, no skin change and no tenderness. Left breast exhibits tenderness. Left breast exhibits no inverted nipple, no mass, no nipple discharge and no skin change.    Well healed right lumpectomy incision. Left breast tenderness with calcified areas.  Lymphadenopathy:    She has no cervical adenopathy.    She has no axillary adenopathy.       Right: No supraclavicular adenopathy present.       Left: No supraclavicular adenopathy present.  Neurological: She is alert and oriented to person, place, and time.  Skin: Skin is warm and dry.  Psychiatric: Her behavior is normal.    Data Reviewed Bilateral diagnostic mammograms dated September 08, 2017 were reviewed.  BI-RADS-2.  Assessment    Stable breast exam.    Plan  Discussed MRI breast, she will call back if she decides to have this completed.  The patient has been asked to return to the office in one year with a bilateral diagnostic mammogram.  The patient is in discussion with her gynecologist regarding prophylactic oophorectomy.  This has been highly recommended based on the difficulty of early detection of ovarian cancer.   HPI, Physical Exam, Assessment and Plan have been scribed under the direction and in the presence of Jeffrey W. Byrnett, MD. Marsha Hatch, RN  I have completed the exam and reviewed the above documentation for accuracy and completeness.  I agree with the above.  [employer] has been used and any errors in dictation or transcription are unintentional.  Jeffrey Byrnett, M.D., F.A.C.S.   Jeffrey W Byrnett 09/22/2017, 5:14 PM   

## 2018-04-21 ENCOUNTER — Encounter: Payer: Self-pay | Admitting: Registered Nurse

## 2018-04-21 ENCOUNTER — Ambulatory Visit: Payer: Self-pay | Admitting: Registered Nurse

## 2018-04-21 VITALS — BP 129/87 | HR 71 | Temp 98.9°F

## 2018-04-21 DIAGNOSIS — S46811A Strain of other muscles, fascia and tendons at shoulder and upper arm level, right arm, initial encounter: Secondary | ICD-10-CM

## 2018-04-21 MED ORDER — MENTHOL (TOPICAL ANALGESIC) 4 % EX GEL: 1.0000 "application " | Freq: Four times a day (QID) | CUTANEOUS | 0 refills | Status: AC | PRN

## 2018-04-21 MED ORDER — IBUPROFEN 800 MG PO TABS: 800.0000 mg | ORAL_TABLET | Freq: Two times a day (BID) | ORAL | 0 refills | Status: AC | PRN

## 2018-04-21 MED ORDER — ACETAMINOPHEN 500 MG PO TABS: 1000.0000 mg | ORAL_TABLET | Freq: Four times a day (QID) | ORAL | 0 refills | Status: AC | PRN

## 2018-04-21 NOTE — Progress Notes (Signed)
Subjective:    Patient ID: Amy Holland, female    DOB: [birth year], 57 y.o.   MRN: [id number]  57y/o Caucasian married established female pt c/o R shoulder pain. States she flexed work areas Friday last week, 11/8. 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 Repetitive movement of moving boxes from pallet to table, opening and emptying boxes. Pt is R hand dominant. Woke up Saturday with R shoulder pain. Points to R deltoid as source of pain. No pain with arm in neutral position, but pain begins with arm abduction at approx 45 degrees. Lifting weight makes pain worse. Pain worst on Saturday and has progressively improved with home care of self massage, stretches, Bengay muscle rub, moist heat therapy, but still persists. Was 9/10 pain on Saturday, today 6/10 when lifting arm. No OTC meds at home.      Review of Systems  Constitutional: Positive for activity change. Negative for appetite change, chills, diaphoresis, fatigue and fever.  HENT: Negative for trouble swallowing and voice change.   Eyes: Negative for photophobia and visual disturbance.  Respiratory: Negative for cough, shortness of breath, wheezing and stridor.   Cardiovascular: Negative for palpitations.  Gastrointestinal: Negative for diarrhea, nausea and vomiting.  Endocrine: Negative for cold intolerance and heat intolerance.  Genitourinary: Negative for difficulty urinating.  Musculoskeletal: Positive for myalgias. Negative for arthralgias, back pain, gait problem, joint swelling, neck pain and neck stiffness.  Skin: Negative for color change, pallor, rash and wound.  Allergic/Immunologic: Positive for environmental allergies. Negative for food allergies.  Neurological: Negative for dizziness, tremors, seizures, syncope, speech difficulty, weakness, light-headedness, numbness and headaches.  Hematological: Negative for adenopathy. Does not bruise/bleed easily.  Psychiatric/Behavioral: Negative for  agitation, confusion and sleep disturbance.       Objective:   Physical Exam  Constitutional: She is oriented to person, place, and time. Vital signs are normal. She appears well-developed and well-nourished. She is active and cooperative.  Non-toxic appearance. She does not have a sickly appearance. She does not appear ill. No distress.  HENT:  Head: Normocephalic and atraumatic.  Right Ear: Hearing and external ear normal.  Left Ear: Hearing and external ear normal.  Nose: Nose normal.  Mouth/Throat: Uvula is midline, oropharynx is clear and moist and mucous membranes are normal. No oropharyngeal exudate. No tonsillar exudate.  Eyes: Pupils are equal, round, and reactive to light. Conjunctivae, EOM and lids are normal. Right eye exhibits no discharge. Left eye exhibits no discharge. No scleral icterus.  Neck: Trachea normal, normal range of motion and phonation normal. Neck supple. No tracheal tenderness, no spinous process tenderness and no muscular tenderness present. No neck rigidity. No tracheal deviation, no edema, no erythema and normal range of motion present.  Cardiovascular: Normal rate, regular rhythm, normal heart sounds and intact distal pulses.  Pulses:      Radial pulses are 2+ on the right side, and 2+ on the left side.  Pulmonary/Chest: Effort normal and breath sounds normal. No stridor. No respiratory distress. She has no decreased breath sounds. She has no wheezes. She has no rhonchi. She has no rales.  Abdominal: Soft. She exhibits no distension. There is no guarding.  Musculoskeletal: Normal range of motion. She exhibits tenderness. She exhibits no edema or deformity.       Right shoulder: Normal.       Left shoulder: Normal.       Right elbow: Normal.      Left elbow: Normal.  Right wrist: Normal.       Left wrist: Normal.       Right hip: Normal.       Left hip: Normal.       Right knee: Normal.       Left knee: Normal.       Cervical back: Normal.        Thoracic back: Normal.       Lumbar back: Normal.       Right upper arm: She exhibits tenderness. She exhibits no bony tenderness, no swelling, no edema, no deformity and no laceration.       Left upper arm: Normal.       Right forearm: Normal.       Left forearm: Normal.       Arms:      Right hand: Normal.       Left hand: Normal.  Negative empty can/atchley scratch/internal and external rotation tests; full AROM with pain right deltoid; biceps muscle and tendon not TTP; paraspinals cervical and thoracic not TTP; bilateral trapezius tight  Lymphadenopathy:       Head (right side): No submental, no submandibular, no tonsillar, no preauricular, no posterior auricular and no occipital adenopathy present.       Head (left side): No submental, no submandibular, no tonsillar, no preauricular, no posterior auricular and no occipital adenopathy present.    She has no cervical adenopathy.       Right cervical: No superficial cervical, no deep cervical and no posterior cervical adenopathy present.      Left cervical: No superficial cervical, no deep cervical and no posterior cervical adenopathy present.  Neurological: She is alert and oriented to person, place, and time. She has normal strength. She is not disoriented. She displays no atrophy, no tremor and normal reflexes. No cranial nerve deficit or sensory deficit. She exhibits normal muscle tone. She displays no seizure activity. Coordination and gait normal. GCS eye subscore is 4. GCS verbal subscore is 5. GCS motor subscore is 6.  Reflex Scores:      Brachioradialis reflexes are 1+ on the right side and 1+ on the left side. On/off exam table; in/out of chair without difficulty; gait sure and steady hallway; bilateral hand grasp equal 5/5  Skin: Skin is warm, dry and intact. Capillary refill takes less than 2 seconds. No abrasion, no bruising, no burn, no ecchymosis, no laceration, no lesion, no petechiae and no rash noted. She is not diaphoretic.  No cyanosis or erythema. No pallor. Nails show no clubbing.  Psychiatric: She has a normal mood and affect. Her speech is normal and behavior is normal. Judgment and thought content normal. She is not actively hallucinating. Cognition and memory are normal. She is attentive.  Nursing note and vitals reviewed.    Right deltoid tender and pain with abduction/cross body and TTP     Assessment & Plan:  A-right deltoid strain  P-exitcare handout printed and given muscle strain and shoulder rehab exercises Motrin 800mg  po BID prn pain #30 RF0 dispensed from [facility] discussed take at least daily with food may alternate with tylenol 1000mg  po BID OTC Trial biofreeze topical QID given 4 UD from clinic stock and thermacare applied from clinic stock to right deltoid also.  Demonstrated stretches in clinic e.g. Pendulums, arm circles, biceps/triceps/trapezius/pectoralis stretches. Discussed if flexed to other area if able to alternate hands used to do so with workload.  Muscle soreness not uncommon with starting new program and flexing to another  area is similar to starting new exercise program best if able to ramp up gradually but if not stretching throughout shift, hydrate with noncaffeinated drinks, heat/cryotherapy 15 minutes QID.  Consider epsom salt soak.  Follow up in 2 weeks for re-evaluation sooner if new or worsening symptoms.   Patient was instructed to rest, ice, and ROM exercises. Activity as tolerated. Patient is to take OTC po NSAIDS as needed. Follow up if symptoms persist or worsen. Patient verbalized agreement and understanding of treatment plan. P2: Injury Prevention and Fitness.

## 2018-04-21 NOTE — Patient Instructions (Signed)
Shoulder Exercises Ask your health care provider which exercises are safe for you. Do exercises exactly as told by your health care provider and adjust them as directed. It is normal to feel mild stretching, pulling, tightness, or discomfort as you do these exercises, but you should stop right away if you feel sudden pain or your pain gets worse.Do not begin these exercises until told by your health care provider. RANGE OF MOTION EXERCISES These exercises warm up your muscles and joints and improve the movement and flexibility of your shoulder. These exercises also help to relieve pain, numbness, and tingling. These exercises involve stretching your injured shoulder directly. Exercise A: Pendulum  1. Stand near a wall or a surface that you can hold onto for balance. 2. Bend at the waist and let your left / right arm hang straight down. Use your other arm to support you. Keep your back straight and do not lock your knees. 3. Relax your left / right arm and shoulder muscles, and move your hips and your trunk so your left / right arm swings freely. Your arm should swing because of the motion of your body, not because you are using your arm or shoulder muscles. 4. Keep moving your body so your arm swings in the following directions, as told by your health care provider: ? Side to side. ? Forward and backward. ? In clockwise and counterclockwise circles. 5. Continue each motion for _____15_____ seconds, or for as long as told by your health care provider. 6. Slowly return to the starting position. Repeat _____2_____ times. Complete this exercise _______3___ times a day. Exercise B:Flexion, Standing  1. Stand and hold a broomstick, a cane, or a similar object. Place your hands a little more than shoulder-width apart on the object. Your left / right hand should be palm-up, and your other hand should be palm-down. 2. Keep your elbow straight and keep your shoulder muscles relaxed. Push the stick down  with your healthy arm to raise your left / right arm in front of your body, and then over your head until you feel a stretch in your shoulder. ? Avoid shrugging your shoulder while you raise your arm. Keep your shoulder blade tucked down toward the middle of your back. 3. Hold for ____15______ seconds. 4. Slowly return to the starting position. Repeat _____2_____ times. Complete this exercise _____3_____ times a day. Exercise C: Abduction, Standing 1. Stand and hold a broomstick, a cane, or a similar object. Place your hands a little more than shoulder-width apart on the object. Your left / right hand should be palm-up, and your other hand should be palm-down. 2. While keeping your elbow straight and your shoulder muscles relaxed, push the stick across your body toward your left / right side. Raise your left / right arm to the side of your body and then over your head until you feel a stretch in your shoulder. ? Do not raise your arm above shoulder height, unless your health care provider tells you to do that. ? Avoid shrugging your shoulder while you raise your arm. Keep your shoulder blade tucked down toward the middle of your back. 3. Hold for 15 seconds. 4. Slowly return to the starting position. Repeat _____2_____ times. Complete this exercise ______3____ times a day. Exercise D:Internal Rotation  1. Place your left / right hand behind your back, palm-up. 2. Use your other hand to dangle an exercise band, a towel, or a similar object over your shoulder. Grasp the band with  your left / right hand so you are holding onto both ends. 3. Gently pull up on the band until you feel a stretch in the front of your left / right shoulder. ? Avoid shrugging your shoulder while you raise your arm. Keep your shoulder blade tucked down toward the middle of your back. 4. Hold for ______15____ seconds. 5. Release the stretch by letting go of the band and lowering your hands. Repeat ___2_______ times.  Complete this exercise _____3_____ times a day. STRETCHING EXERCISES These exercises warm up your muscles and joints and improve the movement and flexibility of your shoulder. These exercises also help to relieve pain, numbness, and tingling. These exercises are done using your healthy shoulder to help stretch the muscles of your injured shoulder. Exercise E: Warehouse manager (External Rotation and Abduction)  1. Stand in a doorway with one of your feet slightly in front of the other. This is called a staggered stance. If you cannot reach your forearms to the door frame, stand facing a corner of a room. 2. Choose one of the following positions as told by your health care provider: ? Place your hands and forearms on the door frame above your head. ? Place your hands and forearms on the door frame at the height of your head. ? Place your hands on the door frame at the height of your elbows. 3. Slowly move your weight onto your front foot until you feel a stretch across your chest and in the front of your shoulders. Keep your head and chest upright and keep your abdominal muscles tight. 4. Hold for ____15______ seconds. 5. To release the stretch, shift your weight to your back foot. Repeat ____2______ times. Complete this stretch ___3_______ times a day. Exercise F:Extension, Standing 1. Stand and hold a broomstick, a cane, or a similar object behind your back. ? Your hands should be a little wider than shoulder-width apart. ? Your palms should face away from your back. 2. Keeping your elbows straight and keeping your shoulder muscles relaxed, move the stick away from your body until you feel a stretch in your shoulder. ? Avoid shrugging your shoulders while you move the stick. Keep your shoulder blade tucked down toward the middle of your back. 3. Hold for _____15_____ seconds. 4. Slowly return to the starting position. Repeat ____2______ times. Complete this exercise _____3_____ times a  day. STRENGTHENING EXERCISES These exercises build strength and endurance in your shoulder. Endurance is the ability to use your muscles for a long time, even after they get tired. Exercise G:External Rotation  1. Sit in a stable chair without armrests. 2. Secure an exercise band at elbow height on your left / right side. 3. Place a soft object, such as a folded towel or a small pillow, between your left / right upper arm and your body to move your elbow a few inches away (about 10 cm) from your side. 4. Hold the end of the band so it is tight and there is no slack. 5. Keeping your elbow pressed against the soft object, move your left / right forearm out, away from your abdomen. Keep your body steady so only your forearm moves. 6. Hold for ____15______ seconds. 7. Slowly return to the starting position. Repeat ____2______ times. Complete this exercise ____3______ times a day. Exercise H:Shoulder Abduction  1. Sit in a stable chair without armrests, or stand. 2. Hold a ___none_______ weight in your left / right hand, or hold an exercise band with both hands.  3. Start with your arms straight down and your left / right palm facing in, toward your body. 4. Slowly lift your left / right hand out to your side. Do not lift your hand above shoulder height unless your health care provider tells you that this is safe. ? Keep your arms straight. ? Avoid shrugging your shoulder while you do this movement. Keep your shoulder blade tucked down toward the middle of your back. 5. Hold for ___15_______ seconds. 6. Slowly lower your arm, and return to the starting position. Repeat ____2______ times. Complete this exercise _____3_____ times a day. Exercise I:Shoulder Extension 1. Sit in a stable chair without armrests, or stand. 2. Secure an exercise band to a stable object in front of you where it is at shoulder height. 3. Hold one end of the exercise band in each hand. Your palms should face each  other. 4. Straighten your elbows and lift your hands up to shoulder height. 5. Step back, away from the secured end of the exercise band, until the band is tight and there is no slack. 6. Squeeze your shoulder blades together as you pull your hands down to the sides of your thighs. Stop when your hands are straight down by your sides. Do not let your hands go behind your body. 7. Hold for ___15_______ seconds. 8. Slowly return to the starting position. Repeat ______2____ times. Complete this exercise ___3_______ times a day. Exercise J:Standing Shoulder Row 1. Sit in a stable chair without armrests, or stand. 2. Secure an exercise band to a stable object in front of you so it is at waist height. 3. Hold one end of the exercise band in each hand. Your palms should be in a thumbs-up position. 4. Bend each of your elbows to an "L" shape (about 90 degrees) and keep your upper arms at your sides. 5. Step back until the band is tight and there is no slack. 6. Slowly pull your elbows back behind you. 7. Hold for ____15______ seconds. 8. Slowly return to the starting position. Repeat ____2______ times. Complete this exercise ______3____ times a day. Exercise K:Shoulder Press-Ups  1. Sit in a stable chair that has armrests. Sit upright, with your feet flat on the floor. 2. Put your hands on the armrests so your elbows are bent and your fingers are pointing forward. Your hands should be about even with the sides of your body. 3. Push down on the armrests and use your arms to lift yourself off of the chair. Straighten your elbows and lift yourself up as much as you comfortably can. ? Move your shoulder blades down, and avoid letting your shoulders move up toward your ears. ? Keep your feet on the ground. As you get stronger, your feet should support less of your body weight as you lift yourself up. 4. Hold for ______15____ seconds. 5. Slowly lower yourself back into the chair. Repeat _____2_____  times. Complete this exercise _____3_____ times a day. Exercise L: Wall Push-Ups  1. Stand so you are facing a stable wall. Your feet should be about one arm-length away from the wall. 2. Lean forward and place your palms on the wall at shoulder height. 3. Keep your feet flat on the floor as you bend your elbows and lean forward toward the wall. 4. Hold for _____15_____ seconds. 5. Straighten your elbows to push yourself back to the starting position. Repeat ____2______ times. Complete this exercise _____3_____ times a day. This information is not intended to replace advice  given to you by your health care provider. Make sure you discuss any questions you have with your health care provider. Document Released: 04/08/2005 Document Revised: 02/17/2016 Document Reviewed: 02/03/2015 Elsevier Interactive Patient Education  2018 [place]. Muscle Strain A muscle strain is an injury that occurs when a muscle is stretched beyond its normal length. Usually a small number of muscle fibers are torn when this happens. Muscle strain is rated in degrees. First-degree strains have the least amount of muscle fiber tearing and pain. Second-degree and third-degree strains have increasingly more tearing and pain. Usually, recovery from muscle strain takes 1-2 weeks. Complete healing takes 5-6 weeks. What are the causes? Muscle strain happens when a sudden, violent force placed on a muscle stretches it too far. This may occur with lifting, sports, or a fall. What increases the risk? Muscle strain is especially common in athletes. What are the signs or symptoms? At the site of the muscle strain, there may be:  Pain.  Bruising.  Swelling.  Difficulty using the muscle due to pain or lack of normal function.  How is this diagnosed? Your health care provider will perform a physical exam and ask about your medical history. How is this treated? Often, the best treatment for a muscle strain is resting,  icing, and applying cold compresses to the injured area. Follow these instructions at home:  Use the PRICE method of treatment to promote muscle healing during the first 2-3 days after your injury. The PRICE method involves: ? Protecting the muscle from being injured again. ? Restricting your activity and resting the injured body part. ? Icing your injury. To do this, put ice in a plastic bag. Place a towel between your skin and the bag. Then, apply the ice and leave it on from 15-20 minutes each hour. After the third day, switch to moist heat packs. ? Apply compression to the injured area with a splint or elastic bandage. Be careful not to wrap it too tightly. This may interfere with blood circulation or increase swelling. ? Elevate the injured body part above the level of your heart as often as you can.  Only take over-the-counter or prescription medicines for pain, discomfort, or fever as directed by your health care provider.  Warming up prior to exercise helps to prevent future muscle strains. Contact a health care provider if:  You have increasing pain or swelling in the injured area.  You have numbness, tingling, or a significant loss of strength in the injured area. This information is not intended to replace advice given to you by your health care provider. Make sure you discuss any questions you have with your health care provider. Document Released: 05/25/2005 Document Revised: 10/31/2015 Document Reviewed: 12/22/2012 Elsevier Interactive Patient Education  2017 [employer].

## 2018-05-03 ENCOUNTER — Encounter: Payer: Self-pay | Admitting: Registered Nurse

## 2018-05-03 ENCOUNTER — Telehealth: Payer: Self-pay | Admitting: Registered Nurse

## 2018-05-03 MED ORDER — IBUPROFEN 800 MG PO TABS: 800.0000 mg | ORAL_TABLET | Freq: Three times a day (TID) | ORAL | 0 refills | Status: DC

## 2018-05-03 MED ORDER — ACETAMINOPHEN 500 MG PO TABS: 1000.0000 mg | ORAL_TABLET | Freq: Four times a day (QID) | ORAL | 0 refills | Status: AC | PRN

## 2018-05-03 NOTE — Telephone Encounter (Addendum)
Presented to clinic today for appt/re-eval.  Last seen 04/21/2018 for right deltoid strain feeling better.  Only had 1 day working in another section since last evaluation.  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  Motrin typically once a day prn.  Denied swelling/rash/bruising/weakness/tingling/numbness.  Still using icy hot prn or biofreeze topical prn.  Bilateral hand grasp equal bilaterally 5/5 and shoulder arom full with pain; TTP deltoid proximal; some triceps tenderness; negative cervical/thoracic/biceps muscle TTP  Continue motrin 800mg  po TID prn pain take at least once a day with food  Follow up in 2 weeks  Motrin 800mg  po BID prn pain previously dispensed from [facility] at home.  Discussed take at least daily with food may alternate with tylenol 1000mg  po BID OTC biofreeze topical QID prn pain.  Continue stretches daily-BID e.g. Pendulums, arm circles, biceps/triceps/trapezius/pectoralis stretches. Discussed if flexed to other area if able to alternate hands used to do so with workload.  Muscle soreness not uncommon with starting new program and flexing to another area is similar to starting new exercise program best if able to ramp up gradually but if not stretching throughout shift, hydrate with noncaffeinated drinks, heat/cryotherapy 15 minutes QID.  Consider epsom salt soak.  Follow up in 2 weeks for re-evaluation sooner if new or worsening symptoms.   Patient was instructed to rest, ice, and ROM exercises. Activity as tolerated. Patient is to take OTC po NSAIDS as needed. Follow up if symptoms persist or worsen. Patient verbalized agreement and understanding of treatment plan. P2: Injury Prevention and Fitness.

## 2018-05-17 ENCOUNTER — Other Ambulatory Visit: Payer: Self-pay | Admitting: *Deleted

## 2018-05-17 NOTE — Telephone Encounter (Signed)
Pt requested Sumatriptan refill through clinic dispensary. Rx on file expired Aug 2019. No updated Rx located in [facility] or Care Everywhere. Rx request faxed to Melonie [place] MD [facility] refill completed by NP Betancourt while awaiting new Rx for future fills.

## 2018-07-21 ENCOUNTER — Encounter: Payer: Self-pay | Admitting: Radiation Oncology

## 2018-07-21 ENCOUNTER — Ambulatory Visit
Admission: RE | Admit: 2018-07-21 | Discharge: 2018-07-21 | Disposition: A | Discharge status: Home / Self Care | Payer: No Typology Code available for payment source | Source: Ambulatory Visit | Attending: Radiation Oncology | Admitting: Radiation Oncology

## 2018-07-21 ENCOUNTER — Other Ambulatory Visit: Payer: Self-pay

## 2018-07-21 VITALS — BP 137/76 | HR 87 | Temp 96.7°F | Resp 18 | Wt 150.4 lb

## 2018-07-21 DIAGNOSIS — C50511 Malignant neoplasm of lower-outer quadrant of right female breast: Secondary | ICD-10-CM

## 2018-07-21 DIAGNOSIS — Z923 Personal history of irradiation: Secondary | ICD-10-CM | POA: Diagnosis not present

## 2018-07-21 DIAGNOSIS — Z853 Personal history of malignant neoplasm of breast: Secondary | ICD-10-CM | POA: Diagnosis not present

## 2018-07-21 DIAGNOSIS — Z170 Estrogen receptor positive status [ER+]: Secondary | ICD-10-CM

## 2018-07-21 NOTE — Progress Notes (Signed)
Radiation Oncology Follow up Note  Name: Amy Holland   Date:   07/21/2018 MRN:  [id number] DOB: 12/27/1960    This 58 y.o. female presents to the clinic today for 2-1/2 year follow-up status post whole breast radiation to her right breast for ER/PR positive invasive mammary carcinoma.  REFERRING PROVIDER: Kirk Ruths, MD  HPI: patient is a 58 year old female now seen out 2 and half years having completed whole breast radiation to her right breast for ER/PR positive invasive mammary carcinoma. She is seen today in routine follow-up and is doing well. She specifically denies breast tenderness cough or bone pain..patient was diagnosed with rectal 1 positivity in May 2018 she's had discussions about bilateral oophorectomies as well as breast MRI scans. Patient is not on antiestrogen therapy.her last mammogram which I have reviewed was back in April was BI-RADS 2 benign.  COMPLICATIONS OF TREATMENT: none  FOLLOW UP COMPLIANCE: keeps appointments   PHYSICAL EXAM:  BP 137/76 (BP Location: Left Arm, Patient Position: Sitting)   Pulse 87   Temp (!) 96.7 F (35.9 C) (Tympanic)   Resp 18   Wt 150 lb 5.7 oz (68.2 kg)   BMI 25.81 kg/m  Lungs are clear to A&P cardiac examination essentially unremarkable with regular rate and rhythm. No dominant mass or nodularity is noted in either breast in 2 positions examined. Incision is well-healed. No axillary or supraclavicular adenopathy is appreciated. Cosmetic result is excellent.Well-developed well-nourished patient in NAD. HEENT reveals PERLA, EOMI, discs not visualized.  Oral cavity is clear. No oral mucosal lesions are identified. Neck is clear without evidence of cervical or supraclavicular adenopathy. Lungs are clear to A&P. Cardiac examination is essentially unremarkable with regular rate and rhythm without murmur rub or thrill. Abdomen is benign with no organomegaly or masses noted. Motor sensory and DTR levels are equal and symmetric in the  upper and lower extremities. Cranial nerves II through XII are grossly intact. Proprioception is intact. No peripheral adenopathy or edema is identified. No motor or sensory levels are noted. Crude visual fields are within normal range.  RADIOLOGY RESULTS: mammograms reviewed and compatible with the above-stated findings  PLAN: present time patient is doing well with no evidence of disease. I'm please were overall progress. She continues close follow-up care with Dr. Tollie Pizza. I've asked to see her back in 1 year for follow-up. Patient is to call with any concerns at any time.  I would like to take this opportunity to thank you for allowing me to participate in the care of your patient.Noreene Filbert, MD

## 2018-07-25 ENCOUNTER — Ambulatory Visit: Payer: Self-pay | Admitting: *Deleted

## 2018-07-25 VITALS — BP 136/95 | HR 76 | Ht 63.0 in | Wt 147.0 lb

## 2018-07-25 DIAGNOSIS — Z0000 Encounter for general adult medical examination without abnormal findings: Secondary | ICD-10-CM

## 2018-07-25 NOTE — Progress Notes (Signed)
[employer] insurance premium discount evaluation: Labs Drawn. Replacements ROI form signed. Tobacco Free Attestation form signed.  Forms placed in paper chart. Okay to route results to Dr. Ouida Sills pcp.

## 2018-07-26 LAB — CMP12+LP+TP+TSH+6AC+CBC/D/PLT
A/G RATIO: 2 (ref 1.2–2.2)
ALT: 23 IU/L (ref 0–32)
AST: 29 IU/L (ref 0–40)
Albumin: 4.7 g/dL (ref 3.8–4.9)
Alkaline Phosphatase: 87 IU/L (ref 39–117)
BASOS ABS: 0.1 10*3/uL (ref 0.0–0.2)
BUN / CREAT RATIO: 18 (ref 9–23)
BUN: 13 mg/dL (ref 6–24)
Basos: 1 %
Bilirubin Total: 0.6 mg/dL (ref 0.0–1.2)
CREATININE: 0.71 mg/dL (ref 0.57–1.00)
Calcium: 9.8 mg/dL (ref 8.7–10.2)
Chloride: 100 mmol/L (ref 96–106)
Chol/HDL Ratio: 2.7 ratio (ref 0.0–4.4)
Cholesterol, Total: 224 mg/dL — ABNORMAL HIGH (ref 100–199)
EOS (ABSOLUTE): 0.1 10*3/uL (ref 0.0–0.4)
Eos: 3 %
Free Thyroxine Index: 1.7 (ref 1.2–4.9)
GFR calc Af Amer: 108 mL/min/{1.73_m2} (ref >59)
GFR, EST NON AFRICAN AMERICAN: 94 mL/min/{1.73_m2} (ref >59)
GGT: 27 IU/L (ref 0–60)
GLUCOSE: 91 mg/dL (ref 65–99)
Globulin, Total: 2.3 g/dL (ref 1.5–4.5)
HDL: 82 mg/dL (ref >39)
Hematocrit: 44.6 % (ref 34.0–46.6)
Hemoglobin: 15.1 g/dL (ref 11.1–15.9)
IRON: 150 ug/dL (ref 27–159)
Immature Grans (Abs): 0 10*3/uL (ref 0.0–0.1)
Immature Granulocytes: 0 %
LDH: 201 IU/L (ref 119–226)
LDL Calculated: 127 mg/dL — ABNORMAL HIGH (ref 0–99)
LYMPHS ABS: 0.8 10*3/uL (ref 0.7–3.1)
Lymphs: 17 %
MCH: 32.2 pg (ref 26.6–33.0)
MCHC: 33.9 g/dL (ref 31.5–35.7)
MCV: 95 fL (ref 79–97)
Monocytes Absolute: 0.3 10*3/uL (ref 0.1–0.9)
Monocytes: 7 %
Neutrophils Absolute: 3.3 10*3/uL (ref 1.4–7.0)
Neutrophils: 72 %
PHOSPHORUS: 2.7 mg/dL — AB (ref 3.0–4.3)
PLATELETS: 239 10*3/uL (ref 150–450)
Potassium: 4.3 mmol/L (ref 3.5–5.2)
RBC: 4.69 x10E6/uL (ref 3.77–5.28)
RDW: 12.2 % (ref 11.7–15.4)
Sodium: 140 mmol/L (ref 134–144)
T3 UPTAKE RATIO: 27 % (ref 24–39)
T4 TOTAL: 6.3 ug/dL (ref 4.5–12.0)
TOTAL PROTEIN: 7 g/dL (ref 6.0–8.5)
TSH: 1.31 u[IU]/mL (ref 0.450–4.500)
Triglycerides: 75 mg/dL (ref 0–149)
URIC ACID: 4.8 mg/dL (ref 2.5–7.1)
VLDL Cholesterol Cal: 15 mg/dL (ref 5–40)
WBC: 4.6 10*3/uL (ref 3.4–10.8)

## 2018-07-26 LAB — HGB A1C W/O EAG: HEMOGLOBIN A1C: 5.7 % — AB (ref 4.8–5.6)

## 2018-07-26 NOTE — Progress Notes (Signed)
noted 

## 2018-08-09 ENCOUNTER — Other Ambulatory Visit: Payer: Self-pay

## 2018-08-09 DIAGNOSIS — Z170 Estrogen receptor positive status [ER+]: Principal | ICD-10-CM

## 2018-08-09 DIAGNOSIS — C50511 Malignant neoplasm of lower-outer quadrant of right female breast: Secondary | ICD-10-CM

## 2018-09-12 ENCOUNTER — Other Ambulatory Visit: Payer: Self-pay

## 2018-09-22 ENCOUNTER — Ambulatory Visit: Payer: PRIVATE HEALTH INSURANCE | Admitting: General Surgery

## 2018-10-11 DIAGNOSIS — Z1509 Genetic susceptibility to other malignant neoplasm: Secondary | ICD-10-CM | POA: Insufficient documentation

## 2018-10-26 ENCOUNTER — Ambulatory Visit
Admission: RE | Admit: 2018-10-26 | Discharge: 2018-10-26 | Disposition: A | Discharge status: Home / Self Care | Payer: No Typology Code available for payment source | Source: Ambulatory Visit | Attending: General Surgery | Admitting: General Surgery

## 2018-10-26 ENCOUNTER — Other Ambulatory Visit: Payer: Self-pay

## 2018-10-26 DIAGNOSIS — C50511 Malignant neoplasm of lower-outer quadrant of right female breast: Secondary | ICD-10-CM

## 2018-10-26 DIAGNOSIS — Z170 Estrogen receptor positive status [ER+]: Secondary | ICD-10-CM

## 2018-11-03 ENCOUNTER — Ambulatory Visit: Payer: PRIVATE HEALTH INSURANCE | Admitting: General Surgery

## 2018-11-08 ENCOUNTER — Encounter: Payer: Self-pay | Admitting: General Surgery

## 2018-11-08 ENCOUNTER — Ambulatory Visit: Payer: PRIVATE HEALTH INSURANCE | Admitting: General Surgery

## 2018-11-08 ENCOUNTER — Other Ambulatory Visit: Payer: Self-pay

## 2018-11-08 VITALS — BP 142/86 | HR 77 | Temp 97.5°F | Ht 64.0 in | Wt 148.0 lb

## 2018-11-08 DIAGNOSIS — C50511 Malignant neoplasm of lower-outer quadrant of right female breast: Secondary | ICD-10-CM

## 2018-11-08 DIAGNOSIS — Z170 Estrogen receptor positive status [ER+]: Secondary | ICD-10-CM | POA: Diagnosis not present

## 2018-11-08 NOTE — Patient Instructions (Signed)
The patient has been asked to return to the office in one year with a bilateral diagnostic mammogram.The patient is aware to call back for any questions or concerns. 

## 2018-11-08 NOTE — Progress Notes (Signed)
Patient ID: Amy Holland, female   DOB: [birth year], 58 y.o.   MRN: [id number]  Chief Complaint  Patient presents with  . Follow-up    mammogram    HPI Amy Holland is a 58 y.o. female who presents for a breast evaluation. The most recent mammogram was done on 10/26/18  Patient does perform regular self breast checks and gets regular mammograms done. Patient is getting her ovaries removed after the COVID-19 is over.   HPI  Past Medical History:  Diagnosis Date  . 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 Breast cancer ([place]) 1994   left lumpectomy with lymph node removal c Radiation and chemo ER/PR negative, node negative  . Breast cancer of lower-outer quadrant of right female breast ([place]) 09/24/2015   Right breast, 7 o'clock:11 mm, histologic grade 2, T1c, N0; ER/PR positive, HER-2/neu not overexpressed. Negative margins  . Colonoscopy refused    Cologuard negative by patient report.  . Depression   . Headache    migraine  . Personal history of chemotherapy 1994   left breast ca  . Personal history of radiation therapy 1994   left breast ca  . Personal history of radiation therapy 2017   right breast ca    Past Surgical History:  Procedure Laterality Date  . BREAST BIOPSY Right 08/12/2015   invasive mammary carcinoma  . BREAST LUMPECTOMY Left 1994   with lymph node removal Dr Amalia Hailey in [place]   . BREAST LUMPECTOMY Right 09/24/2015   breast cancer Right breast, 7 o'clock:11 mm, histologic grade 2, T1c, N0; ER/PR positive, HER-2/neu not overexpressed. Negative margins  LN negative  . ECTOPIC PREGNANCY SURGERY  1989  . PARTIAL MASTECTOMY WITH AXILLARY SENTINEL LYMPH NODE BIOPSY Right 09/24/2015   Procedure: PARTIAL MASTECTOMY WITH AXILLARY SENTINEL LYMPH NODE BIOPSY, WIDE EXCISION, RECONSTRUCTION/MASTOPLASTY;  Surgeon: Robert Bellow, MD;  Location: [facility] ORS;  Service: General;  Laterality: Right;    Family History  Problem Relation Age of Onset  . Breast cancer Sister 72       BRCA1  positive.  . Breast cancer Sister 62       BRCA negative  . Ovarian cancer Mother     Social History Social History   Tobacco Use  . Smoking status: Former Smoker    Last attempt to quit: 06/19/1983    Years since quitting: 35.4  . Smokeless tobacco: Never Used  Substance Use Topics  . Alcohol use: Yes    Alcohol/week: 2.0 standard drinks    Types: 2 Glasses of wine per week    Comment: 2 drinks per day  . Drug use: No    Allergies  Allergen Reactions  . Letrozole Other (See Comments)    Patient states the side effects changed her quality of life while taking  . Latex Rash    Current Outpatient Medications  Medication Sig Dispense Refill  . acetaminophen (TYLENOL) 500 MG tablet Take 1,000 mg by mouth every 6 (six) hours as needed.    . cetirizine (ZYRTEC) 10 MG tablet Take 10 mg by mouth daily.    . Cholecalciferol (VITAMIN D3) 2000 units capsule Take by mouth.    . escitalopram (LEXAPRO) 20 MG tablet Take 20 mg by mouth daily.    . Multiple Vitamin (MULTIVITAMIN) tablet Take 1 tablet by mouth daily.    . SUMAtriptan (IMITREX) 50 MG tablet Take 50 mg by mouth every 2 (two) hours as needed for migraine. May repeat in 2 hours if headache persists or recurs.  No current facility-administered medications for this visit.     Review of Systems Review of Systems  Constitutional: Negative.   Respiratory: Negative.   Cardiovascular: Negative.     Blood pressure (!) 142/86, pulse 77, temperature (!) 97.5 F (36.4 C), temperature source Skin, height 5' 4"  (1.626 m), weight 148 lb (67.1 kg), SpO2 98 %.  Physical Exam Physical Exam Constitutional:      Appearance: She is well-developed.  Eyes:     General: No scleral icterus.    Conjunctiva/sclera: Conjunctivae normal.  Neck:     Musculoskeletal: Neck supple.  Cardiovascular:     Rate and Rhythm: Normal rate and regular rhythm.     Heart sounds: Normal heart sounds.  Pulmonary:     Effort: Pulmonary effort is  normal.     Breath sounds: Normal breath sounds.  Chest:     Breasts:        Right: Normal.        Left: Normal.       Comments: Scaring in the upper inner left breast area.  Lymphadenopathy:     Cervical: No cervical adenopathy.  Skin:    General: Skin is warm and dry.  Neurological:     Mental Status: She is alert and oriented to person, place, and time.     Data Reviewed The patient reports she is scheduled for bilateral salpingo-oophorectomy based on BRCA-1+ status.  I think this is very appropriate.  Bilateral diagnostic mammograms dated Oct 26, 2018 were independently reviewed.  Postsurgical changes noted bilaterally.  BI-RADS-2.  Assessment No evident recurrent cancer.  Plan  The patient has been asked to return to the office in one year with a bilateral diagnostic mammogram.The patient is aware to call back for any questions or concerns.   HPI, Physical Exam, Assessment and Plan have been scribed under the direction and in the presence of Hervey Ard, MD.  Gaspar Cola, CMA  I have completed the exam and reviewed the above documentation for accuracy and completeness.  I agree with the above.  Haematologist has been used and any errors in dictation or transcription are unintentional.  Hervey Ard, M.D., F.A.C.S.  Amy Holland 11/09/2018, 8:39 PM

## 2018-11-09 ENCOUNTER — Encounter: Payer: Self-pay | Admitting: General Surgery

## 2019-01-05 ENCOUNTER — Encounter: Payer: Self-pay | Admitting: General Surgery

## 2019-02-14 ENCOUNTER — Other Ambulatory Visit: Payer: Self-pay | Admitting: *Deleted

## 2019-02-14 MED ORDER — FLUTICASONE PROPIONATE 50 MCG/ACT NA SUSP: 1.0000 | Freq: Two times a day (BID) | NASAL | 6 refills | Status: DC

## 2019-08-04 ENCOUNTER — Other Ambulatory Visit: Payer: Self-pay

## 2019-08-04 ENCOUNTER — Ambulatory Visit
Admission: RE | Admit: 2019-08-04 | Discharge: 2019-08-04 | Disposition: A | Discharge status: Home / Self Care | Payer: PRIVATE HEALTH INSURANCE | Source: Ambulatory Visit | Attending: Radiation Oncology | Admitting: Radiation Oncology

## 2019-08-04 ENCOUNTER — Encounter: Payer: Self-pay | Admitting: Radiation Oncology

## 2019-08-04 VITALS — BP 139/65 | HR 95 | Temp 97.4°F | Resp 16 | Wt 148.9 lb

## 2019-08-04 DIAGNOSIS — Z1501 Genetic susceptibility to malignant neoplasm of breast: Secondary | ICD-10-CM | POA: Diagnosis not present

## 2019-08-04 DIAGNOSIS — Z853 Personal history of malignant neoplasm of breast: Secondary | ICD-10-CM | POA: Insufficient documentation

## 2019-08-04 DIAGNOSIS — Z923 Personal history of irradiation: Secondary | ICD-10-CM | POA: Insufficient documentation

## 2019-08-04 DIAGNOSIS — Z170 Estrogen receptor positive status [ER+]: Secondary | ICD-10-CM

## 2019-08-04 DIAGNOSIS — C50511 Malignant neoplasm of lower-outer quadrant of right female breast: Secondary | ICD-10-CM

## 2019-08-04 NOTE — Progress Notes (Signed)
Radiation Oncology Follow up Note  Name: Amy Holland   Date:   08/04/2019 MRN:  [id number] DOB: [birth year]    This 59 y.o. female presents to the clinic today for 3-1/2-year follow-up status post whole breast radiation to her right breast for ER/PR positive invasive mammary carcinoma.  REFERRING PROVIDER: Kirk Ruths, MD  HPI: Patient is a 59 year old female now about 3-1/2 years having completed external beam radiation therapy to her right breast for stage I ER/PR positive invasive mammary carcinoma.  She is seen today in routine follow-up and is doing fine.  She specifically denies breast tenderness cough or bone pain she is had breast cancer in the left breast status post treatment with only a fair cosmetic result in the past.  She has been in discussion to have bilateral oophorectomy since she is BRCA positive..  She had mammograms back in May which I have reviewed were BI-RADS 2 benign.  Patient is not currently on antiestrogen therapy.  COMPLICATIONS OF TREATMENT: none  FOLLOW UP COMPLIANCE: keeps appointments   PHYSICAL EXAM:  BP 139/65   Pulse 95   Temp (!) 97.4 F (36.3 C) (Tympanic)   Resp 16   Wt 148 lb 14.4 oz (67.5 kg)   BMI 25.56 kg/m  Right breast is no dominant mass or nodularity excellent cosmetic result.  Left breast has retraction of the scar towards the nipple with indentation of the breast.  She has a metallic area of nodularity in the lumpectomy site consistent with prior marker.  No axillary or supraclavicular adenopathy is identified.  Well-developed well-nourished patient in NAD. HEENT reveals PERLA, EOMI, discs not visualized.  Oral cavity is clear. No oral mucosal lesions are identified. Neck is clear without evidence of cervical or supraclavicular adenopathy. Lungs are clear to A&P. Cardiac examination is essentially unremarkable with regular rate and rhythm without murmur rub or thrill. Abdomen is benign with no organomegaly or masses noted. Motor  sensory and DTR levels are equal and symmetric in the upper and lower extremities. Cranial nerves II through XII are grossly intact. Proprioception is intact. No peripheral adenopathy or edema is identified. No motor or sensory levels are noted. Crude visual fields are within normal range.  RADIOLOGY RESULTS: Mammograms reviewed compatible with above-stated findings  PLAN: Present time patient is reestablish care with Dr. Tollie Pizza.  She is requested to discontinue follow-up care in my practice.  It is now 3.  3-1/2 years out close to 4 years I am fine with that.  Patient knows to call me anytime with any concerns.  I would like to take this opportunity to thank you for allowing me to participate in the care of your patient.Noreene Filbert, MD

## 2019-09-05 ENCOUNTER — Other Ambulatory Visit: Payer: Self-pay | Admitting: Registered Nurse

## 2019-09-05 ENCOUNTER — Encounter: Payer: Self-pay | Admitting: Registered Nurse

## 2019-09-05 NOTE — Telephone Encounter (Signed)
Signed flonase 37mcg nasal spray 1 spray each nostril BID #48 RF3 to her pharmacy of choice refill for patient.

## 2019-09-08 ENCOUNTER — Ambulatory Visit: Payer: Self-pay | Admitting: *Deleted

## 2019-09-08 ENCOUNTER — Other Ambulatory Visit: Payer: Self-pay

## 2019-09-08 DIAGNOSIS — Z0000 Encounter for general adult medical examination without abnormal findings: Secondary | ICD-10-CM

## 2019-09-08 NOTE — Progress Notes (Signed)
Fasting labs per pcp orders. Orders converted to exec panel + A1c for cost efficiency. Pt made aware. MyChart active. Will route results to pcp upon receipt.

## 2019-09-09 LAB — CMP12+LP+TP+TSH+6AC+CBC/D/PLT
ALT: 24 IU/L (ref 0–32)
AST: 31 IU/L (ref 0–40)
Albumin/Globulin Ratio: 2 (ref 1.2–2.2)
Albumin: 5.1 g/dL — ABNORMAL HIGH (ref 3.8–4.8)
Alkaline Phosphatase: 106 IU/L (ref 39–117)
BUN/Creatinine Ratio: 28 (ref 12–28)
BUN: 16 mg/dL (ref 8–27)
Basophils Absolute: 0.1 10*3/uL (ref 0.0–0.2)
Basos: 2 %
Bilirubin Total: 0.7 mg/dL (ref 0.0–1.2)
Calcium: 10.6 mg/dL — ABNORMAL HIGH (ref 8.7–10.3)
Chloride: 99 mmol/L (ref 96–106)
Chol/HDL Ratio: 2.3 ratio (ref 0.0–4.4)
Cholesterol, Total: 229 mg/dL — ABNORMAL HIGH (ref 100–199)
Creatinine, Ser: 0.57 mg/dL (ref 0.57–1.00)
EOS (ABSOLUTE): 0.2 10*3/uL (ref 0.0–0.4)
Eos: 5 %
Estimated CHD Risk: <0.5 times avg. (ref 0.0–1.0)
Free Thyroxine Index: 1.9 (ref 1.2–4.9)
GFR calc Af Amer: 116 mL/min/{1.73_m2} (ref >59)
GFR calc non Af Amer: 100 mL/min/{1.73_m2} (ref >59)
GGT: 25 IU/L (ref 0–60)
Globulin, Total: 2.6 g/dL (ref 1.5–4.5)
Glucose: 86 mg/dL (ref 65–99)
HDL: 98 mg/dL (ref >39)
Hematocrit: 44.4 % (ref 34.0–46.6)
Hemoglobin: 15.8 g/dL (ref 11.1–15.9)
Immature Grans (Abs): 0 10*3/uL (ref 0.0–0.1)
Immature Granulocytes: 0 %
Iron: 125 ug/dL (ref 27–139)
LDH: 207 IU/L (ref 119–226)
LDL Chol Calc (NIH): 121 mg/dL — ABNORMAL HIGH (ref 0–99)
Lymphocytes Absolute: 0.9 10*3/uL (ref 0.7–3.1)
Lymphs: 22 %
MCH: 32.1 pg (ref 26.6–33.0)
MCHC: 35.6 g/dL (ref 31.5–35.7)
MCV: 90 fL (ref 79–97)
Monocytes Absolute: 0.4 10*3/uL (ref 0.1–0.9)
Monocytes: 11 %
Neutrophils Absolute: 2.4 10*3/uL (ref 1.4–7.0)
Neutrophils: 60 %
Phosphorus: 3.4 mg/dL (ref 3.0–4.3)
Platelets: 243 10*3/uL (ref 150–450)
Potassium: 4.2 mmol/L (ref 3.5–5.2)
RBC: 4.92 x10E6/uL (ref 3.77–5.28)
RDW: 12.4 % (ref 11.7–15.4)
Sodium: 137 mmol/L (ref 134–144)
T3 Uptake Ratio: 26 % (ref 24–39)
T4, Total: 7.4 ug/dL (ref 4.5–12.0)
TSH: 1.25 u[IU]/mL (ref 0.450–4.500)
Total Protein: 7.7 g/dL (ref 6.0–8.5)
Triglycerides: 61 mg/dL (ref 0–149)
Uric Acid: 5.9 mg/dL (ref 3.0–7.2)
VLDL Cholesterol Cal: 10 mg/dL (ref 5–40)
WBC: 3.9 10*3/uL (ref 3.4–10.8)

## 2019-09-09 LAB — HGB A1C W/O EAG: Hgb A1c MFr Bld: 5.6 % (ref 4.8–5.6)

## 2019-09-12 ENCOUNTER — Other Ambulatory Visit: Payer: Self-pay

## 2019-09-12 DIAGNOSIS — Z170 Estrogen receptor positive status [ER+]: Secondary | ICD-10-CM

## 2019-09-12 DIAGNOSIS — C50511 Malignant neoplasm of lower-outer quadrant of right female breast: Secondary | ICD-10-CM

## 2019-09-24 ENCOUNTER — Other Ambulatory Visit: Payer: Self-pay | Admitting: General Surgery

## 2019-09-24 DIAGNOSIS — Z853 Personal history of malignant neoplasm of breast: Secondary | ICD-10-CM

## 2019-10-03 LAB — COLOGUARD: COLOGUARD: NEGATIVE

## 2019-10-27 ENCOUNTER — Other Ambulatory Visit: Payer: PRIVATE HEALTH INSURANCE

## 2019-10-27 ENCOUNTER — Ambulatory Visit
Admission: RE | Admit: 2019-10-27 | Discharge: 2019-10-27 | Disposition: A | Discharge status: Home / Self Care | Payer: PRIVATE HEALTH INSURANCE | Source: Ambulatory Visit | Attending: General Surgery | Admitting: General Surgery

## 2019-10-27 ENCOUNTER — Ambulatory Visit
Admission: RE | Admit: 2019-10-27 | Discharge: 2019-10-27 | Disposition: A | Discharge status: Home / Self Care | Payer: PRIVATE HEALTH INSURANCE | Source: Ambulatory Visit | Attending: Surgery | Admitting: Surgery

## 2019-10-27 DIAGNOSIS — Z170 Estrogen receptor positive status [ER+]: Secondary | ICD-10-CM | POA: Diagnosis present

## 2019-10-27 DIAGNOSIS — C50511 Malignant neoplasm of lower-outer quadrant of right female breast: Secondary | ICD-10-CM

## 2019-10-27 DIAGNOSIS — Z853 Personal history of malignant neoplasm of breast: Secondary | ICD-10-CM

## 2019-11-01 ENCOUNTER — Ambulatory Visit: Payer: PRIVATE HEALTH INSURANCE | Admitting: Surgery

## 2019-12-21 ENCOUNTER — Other Ambulatory Visit: Payer: Self-pay | Admitting: Obstetrics and Gynecology

## 2020-01-04 NOTE — H&P (Signed)
Amy Holland is a 59 y.o. female here for Pre Op Consulting (surgical consult, discuss getting ovaries removed)  Referring provider: Self   Body mass index is 25.58 kg/m.  History of Present Illness: Patient returns for a preoperative discussion for a Laparoscopic Bilateral Salpingo Oophorectomy. Indications for surgery include: BRCA 1 gene positive, personal hx of breast cancer, fhx of breast cancer, maternal fhx of ovarian cancer.   She has a hx of breast cancer dx in 2017 right ER+ breast cancer. Patient has tested her genetic susceptibility for BRCA1, and she DOES have BRCA1. All sisters got ovaries removed; patient is considering this for herself.  Breast CA tx GE:[id number] radiation and trialed letrozole x4 months, with debilitating mood side effects and sleep disorder. She has since decided to come off of the letrozole  Pertinent hx: -Personal history of breast cancer: 1994,and 2017 withleft lumpectomy. -Strong family Hx of breast cancer -3 of 4 sisters siblings with breast cancer. 2 of them have tested positive for BRCA1.All sisters got ovaries removed; patient is considering this for herself. -Personal history of migraine -Motherdied fromovarian cancer at age 14  Pertinent Surgical Hx: -Hx of ectopic pregnancy removal w/ unilateral salpingectomy, pt unsure which side   Past Medical History:  has a past medical history of Allergy, BRCA1 gene mutation positive, History of breast cancer in female (06/24/2015), and Migraine without aura and without status migrainosus, not intractable (06/24/2015).  Past Surgical History:  has a past surgical history that includes Mastectomy partial / lumpectomy (Left, 1994). Family History: family history includes Breast cancer in her sister; Coronary Artery Disease (Blocked arteries around heart) in her father; High blood pressure (Hypertension) in her father; Ovarian cancer in her mother. Social History:  reports that she has quit smoking.  She has never used smokeless tobacco. She reports current alcohol use of about 8.0 standard drinks of alcohol per week. She reports that she does not use drugs. OB/GYN History:          OB History    Gravida  4   Para  3   Term  3   Preterm      AB  1   Living  3     SAB      TAB      Ectopic  1   Molar      Multiple      Live Births           Allergies: is allergic to letrozole and latex. Medications: Current Outpatient Medications:  .  acetaminophen (TYLENOL) 500 MG tablet, Take 1,000 mg by mouth every 8 (eight) hours as needed   , Disp: , Rfl:  .  cetirizine (ZYRTEC) 10 MG tablet, Take 10 mg by mouth once daily   , Disp: , Rfl:  .  escitalopram oxalate (LEXAPRO) 20 MG tablet, Take 1 tablet (20 mg total) by mouth once daily, Disp: 90 tablet, Rfl: 3 .  fluticasone (FLONASE) 50 mcg/actuation nasal spray, Place 2 sprays into both nostrils once daily., Disp: , Rfl:  .  multivitamin tablet, Take 1 tablet by mouth once daily. Reported on 07/31/2015 , Disp: , Rfl:  .  SUMAtriptan (IMITREX) 50 MG tablet, Take 50 mg by mouth once as needed   , Disp: , Rfl:  .  cholecalciferol (VITAMIN D3) 2,000 unit capsule, Take 2,000 Units by mouth once daily., Disp: , Rfl:    Exam:   BP 134/80   Ht 162.6 cm (5' 4" )   Wt 67.6  kg (149 lb)   BMI 25.58 kg/m   Constitutional:  General appearance: Well nourished, well developed female in no acute distress.  CV: RRR Pulm: CTAB Neuro/psych:  Normal mood and affect. No gross motor deficits. Neck:  Supple, normal appearance.  Respiratory:  Normal respiratory effort, no use of accessory muscles Skin:  No visible rashes or external lesions  Impression:   The primary encounter diagnosis was BRCA positive. Diagnoses of Personal history of breast cancer, Family history of breast cancer, and Family history of ovarian cancer were also pertinent to this visit.  Plan:   1. Pre-operative discussion  Patient returns for a  preoperative discussion regarding her plans to proceed with surgical treatment of her Presence of BRCA 1, personal hx of breast cancer, strong fhx of breast cancer, maternal hx of ovarian cancer by Bilateral Salpino-Oophorectomy  procedure. The patient and I discussed the technical aspects of the procedure including the potential for risks and complications. These include but are not limited to the risk of infection requiring post-operative antibiotics or further procedures. We talked about the risk of injury to adjacent organs including bladder, bowel, ureter, blood vessels or nerves. We talked about the need to convert to an open incision. We talked about the possible need for blood transfusion. We talked aboutpostop complications such asthromboembolic or cardiopulmonary complications. All of her questions were answered.  Her preoperative exam was completed and the appropriate consents were signed. She is scheduled to undergo this procedure in the near future.  Specific Peri-operative Considerations:  - Consent: obtained today - Health Maintenance: up to date - Labs: CBC, CMP preoperatively - Studies: EKG, CXR preoperatively - Bowel Preparation: None required - Abx:  n/a - VTE ppx: SCDs perioperatively - Glucose Protocol: n/a - Beta-blockade: n/a   Diagnoses and all orders for this visit:  BRCA positive  Personal history of breast cancer  Family history of breast cancer  Family history of ovarian cancer

## 2020-01-12 ENCOUNTER — Other Ambulatory Visit: Payer: Self-pay

## 2020-01-12 ENCOUNTER — Encounter
Admission: RE | Admit: 2020-01-12 | Discharge: 2020-01-12 | Disposition: A | Discharge status: Home / Self Care | Payer: PRIVATE HEALTH INSURANCE | Source: Ambulatory Visit | Attending: Obstetrics and Gynecology | Admitting: Obstetrics and Gynecology

## 2020-01-12 HISTORY — DX: Lymphedema, not elsewhere classified: I89.0

## 2020-01-12 NOTE — Patient Instructions (Addendum)
COVID TESTING Date: Thursday, August 12 Testing site:  [facility] [address] Thru Hours:  9:83 am - 1:00 pm Once you are tested, you are asked to stay quarantined (avoiding public places) until after your surgery.   Your procedure is scheduled on: Monday, August 16 Report to Day Surgery on the 2nd floor of the [facility]. To find out your arrival time, please call [phone] between 1PM - 3PM on: Friday, August 13  REMEMBER: Instructions that are not followed completely may result in serious medical risk, up to and including death; or upon the discretion of your surgeon and anesthesiologist your surgery may need to be rescheduled.  Do not eat food after midnight the night before surgery.  No gum chewing, lozengers or hard candies.  You may however, drink CLEAR liquids up to 2 hours before you are scheduled to arrive for your surgery. Do not drink anything within 2 hours of your scheduled arrival time.  Clear liquids include: - water  - apple juice without pulp - gatorade (not RED) - black coffee or tea (Do NOT add milk or creamers to the coffee or tea) Do NOT drink anything that is not on this list.  ENSURE PRE-SURGERY CARBOHYDRATE DRINK:  Complete drinking 2 hours prior to scheduled arrival time.  TAKE THESE MEDICATIONS THE MORNING OF SURGERY WITH A SIP OF WATER:  1.  lexapro  Stop Anti-inflammatories (NSAIDS) such as Advil, Aleve, Ibuprofen, Motrin, Naproxen, Naprosyn and Aspirin based products such as Excedrin, Goodys Powder, BC Powder. (May take Tylenol or Acetaminophen if needed.)  Stop ANY OVER THE COUNTER supplements until after surgery. (glucosamine) (May continue Vitamin B, and multivitamin.)  No Alcohol for 24 hours before or after surgery.  On the morning of surgery brush your teeth with toothpaste and water, you may rinse your mouth with mouthwash if you wish. Do not swallow any toothpaste or mouthwash.  Do not wear  jewelry, make-up, hairpins, clips or nail polish.  Do not wear lotions, powders, or perfumes.   Do not shave 48 hours prior to surgery.   Do not bring valuables to the hospital. [facility] is not responsible for any missing/lost belongings or valuables.   Use CHG Soap as directed on instruction sheet.  Notify your doctor if there is any change in your medical condition (cold, fever, infection).  Wear comfortable clothing (specific to your surgery type) to the hospital.  Plan for stool softeners for home use; pain medications have a tendency to cause constipation. You can also help prevent constipation by eating foods high in fiber such as fruits and vegetables and drinking plenty of fluids as your diet allows.  After surgery, you can help prevent lung complications by doing breathing exercises.  Take deep breaths and cough every 1-2 hours. Your doctor may order a device called an Incentive Spirometer to help you take deep breaths. When coughing or sneezing, hold a pillow firmly against your incision with both hands. This is called splinting. Doing this helps protect your incision. It also decreases belly discomfort.  If you are being discharged the day of surgery, you will not be allowed to drive home. You will need a responsible adult (18 years or older) to drive you home and stay with you that night.   If you are taking public transportation, you will need to have a responsible adult (18 years or older) with you. Please confirm with your physician that it is acceptable to use public transportation.  Please call the [place] [facility] at [phone] if you have any questions about these instructions.  Visitation Policy:  Patients undergoing a surgery or procedure may have one family member or support person with them as long as that person is not COVID-19 positive or experiencing its symptoms.  That person may remain in the waiting area during the  procedure.  Masking is required regardless of vaccination status.  Systemwide, no visitors 17 or younger.

## 2020-01-18 ENCOUNTER — Other Ambulatory Visit
Admission: RE | Admit: 2020-01-18 | Discharge: 2020-01-18 | Disposition: A | Discharge status: Home / Self Care | Payer: PRIVATE HEALTH INSURANCE | Source: Ambulatory Visit | Attending: Obstetrics and Gynecology | Admitting: Obstetrics and Gynecology

## 2020-01-18 ENCOUNTER — Other Ambulatory Visit: Payer: Self-pay

## 2020-01-18 DIAGNOSIS — Z01812 Encounter for preprocedural laboratory examination: Secondary | ICD-10-CM | POA: Diagnosis present

## 2020-01-18 DIAGNOSIS — Z20822 Contact with and (suspected) exposure to covid-19: Secondary | ICD-10-CM | POA: Diagnosis not present

## 2020-01-18 LAB — BASIC METABOLIC PANEL
Anion gap: 10 (ref 5–15)
BUN: 22 mg/dL (ref 8–23)
CO2: 29 mmol/L (ref 22–32)
Calcium: 10.6 mg/dL — ABNORMAL HIGH (ref 8.9–10.3)
Chloride: 103 mmol/L (ref 98–111)
Creatinine, Ser: 0.63 mg/dL (ref 0.44–1.00)
GFR calc Af Amer: >60 mL/min (ref >60)
GFR calc non Af Amer: >60 mL/min (ref >60)
Glucose, Bld: 96 mg/dL (ref 70–99)
Potassium: 4.1 mmol/L (ref 3.5–5.1)
Sodium: 142 mmol/L (ref 135–145)

## 2020-01-18 LAB — TYPE AND SCREEN
ABO/RH(D): B POS
Antibody Screen: NEGATIVE

## 2020-01-18 LAB — CBC
HCT: 43.3 % (ref 36.0–46.0)
Hemoglobin: 15.2 g/dL — ABNORMAL HIGH (ref 12.0–15.0)
MCH: 32.1 pg (ref 26.0–34.0)
MCHC: 35.1 g/dL (ref 30.0–36.0)
MCV: 91.4 fL (ref 80.0–100.0)
Platelets: 220 10*3/uL (ref 150–400)
RBC: 4.74 MIL/uL (ref 3.87–5.11)
RDW: 12.5 % (ref 11.5–15.5)
WBC: 3.5 10*3/uL — ABNORMAL LOW (ref 4.0–10.5)
nRBC: 0 % (ref 0.0–0.2)

## 2020-01-18 LAB — SARS CORONAVIRUS 2 (TAT 6-24 HRS): SARS Coronavirus 2: NEGATIVE

## 2020-01-22 ENCOUNTER — Ambulatory Visit: Payer: No Typology Code available for payment source | Admitting: Anesthesiology

## 2020-01-22 ENCOUNTER — Other Ambulatory Visit: Payer: Self-pay

## 2020-01-22 ENCOUNTER — Encounter
Admission: RE | Disposition: A | Discharge status: Home / Self Care | Payer: Self-pay | Source: Home / Self Care | Attending: Obstetrics and Gynecology

## 2020-01-22 ENCOUNTER — Ambulatory Visit
Admission: RE | Admit: 2020-01-22 | Discharge: 2020-01-22 | Disposition: A | Discharge status: Home / Self Care | Payer: No Typology Code available for payment source | Attending: Obstetrics and Gynecology | Admitting: Obstetrics and Gynecology

## 2020-01-22 ENCOUNTER — Encounter: Payer: Self-pay | Admitting: Obstetrics and Gynecology

## 2020-01-22 DIAGNOSIS — Z803 Family history of malignant neoplasm of breast: Secondary | ICD-10-CM | POA: Diagnosis not present

## 2020-01-22 DIAGNOSIS — Z4002 Encounter for prophylactic removal of ovary: Secondary | ICD-10-CM | POA: Diagnosis present

## 2020-01-22 DIAGNOSIS — Z79899 Other long term (current) drug therapy: Secondary | ICD-10-CM | POA: Diagnosis not present

## 2020-01-22 DIAGNOSIS — Z8041 Family history of malignant neoplasm of ovary: Secondary | ICD-10-CM | POA: Diagnosis not present

## 2020-01-22 DIAGNOSIS — Z87891 Personal history of nicotine dependence: Secondary | ICD-10-CM | POA: Diagnosis not present

## 2020-01-22 DIAGNOSIS — Z9221 Personal history of antineoplastic chemotherapy: Secondary | ICD-10-CM | POA: Diagnosis not present

## 2020-01-22 DIAGNOSIS — Z853 Personal history of malignant neoplasm of breast: Secondary | ICD-10-CM | POA: Insufficient documentation

## 2020-01-22 DIAGNOSIS — Z1501 Genetic susceptibility to malignant neoplasm of breast: Secondary | ICD-10-CM | POA: Insufficient documentation

## 2020-01-22 DIAGNOSIS — Z923 Personal history of irradiation: Secondary | ICD-10-CM | POA: Diagnosis not present

## 2020-01-22 HISTORY — PX: LAPAROSCOPIC BILATERAL SALPINGO OOPHERECTOMY: SHX5890

## 2020-01-22 LAB — ABO/RH: ABO/RH(D): B POS

## 2020-01-22 SURGERY — SALPINGO-OOPHORECTOMY, BILATERAL, LAPAROSCOPIC
Anesthesia: General | Site: Abdomen | Laterality: Bilateral

## 2020-01-22 MED ORDER — SUGAMMADEX SODIUM 200 MG/2ML IV SOLN
INTRAVENOUS | Status: DC | PRN
  Administered 2020-01-22: 200 mg via INTRAVENOUS

## 2020-01-22 MED ORDER — IBUPROFEN 800 MG PO TABS: 800.0000 mg | ORAL_TABLET | Freq: Three times a day (TID) | ORAL | 1 refills | Status: AC

## 2020-01-22 MED ORDER — OXYCODONE HCL 5 MG PO TABS: 5.0000 mg | ORAL_TABLET | Freq: Once | ORAL | Status: DC | PRN

## 2020-01-22 MED ORDER — PROPOFOL 10 MG/ML IV BOLUS
INTRAVENOUS | Status: AC
  Filled 2020-01-22: qty 20

## 2020-01-22 MED ORDER — LIDOCAINE HCL (CARDIAC) PF 100 MG/5ML IV SOSY
PREFILLED_SYRINGE | INTRAVENOUS | Status: DC | PRN
  Administered 2020-01-22: 40 mg via INTRAVENOUS
  Administered 2020-01-22: 60 mg via INTRAVENOUS

## 2020-01-22 MED ORDER — GABAPENTIN 300 MG PO CAPS
ORAL_CAPSULE | ORAL | Status: AC
  Administered 2020-01-22: 900 mg via ORAL
  Filled 2020-01-22: qty 3

## 2020-01-22 MED ORDER — LIDOCAINE HCL (PF) 2 % IJ SOLN
INTRAMUSCULAR | Status: AC
  Filled 2020-01-22: qty 5

## 2020-01-22 MED ORDER — PHENYLEPHRINE HCL (PRESSORS) 10 MG/ML IV SOLN
INTRAVENOUS | Status: DC | PRN
  Administered 2020-01-22: 50 ug via INTRAVENOUS
  Administered 2020-01-22 (×4): 100 ug via INTRAVENOUS

## 2020-01-22 MED ORDER — DEXAMETHASONE SODIUM PHOSPHATE 10 MG/ML IJ SOLN
INTRAMUSCULAR | Status: AC
  Filled 2020-01-22: qty 1

## 2020-01-22 MED ORDER — FENTANYL CITRATE (PF) 100 MCG/2ML IJ SOLN: 25.0000 ug | INTRAMUSCULAR | Status: DC | PRN

## 2020-01-22 MED ORDER — ONDANSETRON HCL 4 MG/2ML IJ SOLN
INTRAMUSCULAR | Status: DC | PRN
  Administered 2020-01-22: 4 mg via INTRAVENOUS

## 2020-01-22 MED ORDER — KETAMINE HCL 10 MG/ML IJ SOLN
INTRAMUSCULAR | Status: DC | PRN
Start: 2020-01-22 — End: 2020-01-22
  Administered 2020-01-22: 10 mg via INTRAVENOUS
  Administered 2020-01-22: 20 mg via INTRAVENOUS

## 2020-01-22 MED ORDER — BUPIVACAINE HCL 0.5 % IJ SOLN
INTRAMUSCULAR | Status: DC | PRN
  Administered 2020-01-22: 10 mL

## 2020-01-22 MED ORDER — ACETAMINOPHEN 500 MG PO TABS
1000.0000 mg | ORAL_TABLET | Freq: Four times a day (QID) | ORAL | 0 refills | Status: AC
Start: 2020-01-22 — End: 2020-01-25

## 2020-01-22 MED ORDER — SODIUM CHLORIDE (PF) 0.9 % IJ SOLN
INTRAMUSCULAR | Status: AC
  Filled 2020-01-22: qty 10

## 2020-01-22 MED ORDER — FENTANYL CITRATE (PF) 100 MCG/2ML IJ SOLN
INTRAMUSCULAR | Status: DC | PRN
  Administered 2020-01-22: 50 ug via INTRAVENOUS

## 2020-01-22 MED ORDER — GABAPENTIN 800 MG PO TABS: 400.0000 mg | ORAL_TABLET | Freq: Every day | ORAL | 0 refills | Status: DC

## 2020-01-22 MED ORDER — KETOROLAC TROMETHAMINE 30 MG/ML IJ SOLN
INTRAMUSCULAR | Status: AC
  Filled 2020-01-22: qty 1

## 2020-01-22 MED ORDER — DOCUSATE SODIUM 100 MG PO CAPS
100.0000 mg | ORAL_CAPSULE | Freq: Two times a day (BID) | ORAL | 0 refills | Status: DC
Start: 2020-01-22 — End: 2020-05-09

## 2020-01-22 MED ORDER — ROCURONIUM BROMIDE 100 MG/10ML IV SOLN
INTRAVENOUS | Status: DC | PRN
  Administered 2020-01-22: 50 mg via INTRAVENOUS
  Administered 2020-01-22: 20 mg via INTRAVENOUS

## 2020-01-22 MED ORDER — FAMOTIDINE 20 MG PO TABS
ORAL_TABLET | ORAL | Status: AC
  Administered 2020-01-22: 20 mg via ORAL
  Filled 2020-01-22: qty 1

## 2020-01-22 MED ORDER — CHLORHEXIDINE GLUCONATE 0.12 % MT SOLN
OROMUCOSAL | Status: AC
  Administered 2020-01-22: 15 mL via OROMUCOSAL
  Filled 2020-01-22: qty 15

## 2020-01-22 MED ORDER — FENTANYL CITRATE (PF) 100 MCG/2ML IJ SOLN
INTRAMUSCULAR | Status: AC
  Filled 2020-01-22: qty 2

## 2020-01-22 MED ORDER — GABAPENTIN 300 MG PO CAPS: 900.0000 mg | ORAL_CAPSULE | ORAL | Status: AC

## 2020-01-22 MED ORDER — GLYCOPYRROLATE 0.2 MG/ML IJ SOLN
INTRAMUSCULAR | Status: AC
  Filled 2020-01-22: qty 1

## 2020-01-22 MED ORDER — PROPOFOL 10 MG/ML IV BOLUS
INTRAVENOUS | Status: DC | PRN
  Administered 2020-01-22: 50 mg via INTRAVENOUS
  Administered 2020-01-22: 100 mg via INTRAVENOUS

## 2020-01-22 MED ORDER — CHLORHEXIDINE GLUCONATE 0.12 % MT SOLN: 15.0000 mL | Freq: Once | OROMUCOSAL | Status: AC

## 2020-01-22 MED ORDER — ACETAMINOPHEN 500 MG PO TABS: 1000.0000 mg | ORAL_TABLET | ORAL | Status: AC

## 2020-01-22 MED ORDER — CEFAZOLIN SODIUM 1 G IJ SOLR
INTRAMUSCULAR | Status: AC
  Filled 2020-01-22: qty 20

## 2020-01-22 MED ORDER — OXYCODONE HCL 5 MG/5ML PO SOLN: 5.0000 mg | Freq: Once | ORAL | Status: DC | PRN

## 2020-01-22 MED ORDER — DEXMEDETOMIDINE HCL 200 MCG/2ML IV SOLN
INTRAVENOUS | Status: DC | PRN
  Administered 2020-01-22: 8 ug via INTRAVENOUS
  Administered 2020-01-22: 12 ug via INTRAVENOUS

## 2020-01-22 MED ORDER — OXYCODONE HCL 5 MG PO TABS: 5.0000 mg | ORAL_TABLET | ORAL | 0 refills | Status: DC | PRN

## 2020-01-22 MED ORDER — ORAL CARE MOUTH RINSE: 15.0000 mL | Freq: Once | OROMUCOSAL | Status: AC

## 2020-01-22 MED ORDER — DEXAMETHASONE SODIUM PHOSPHATE 10 MG/ML IJ SOLN
INTRAMUSCULAR | Status: DC | PRN
  Administered 2020-01-22: 6 mg via INTRAVENOUS

## 2020-01-22 MED ORDER — ACETAMINOPHEN 500 MG PO TABS
ORAL_TABLET | ORAL | Status: AC
  Administered 2020-01-22: 1000 mg via ORAL
  Filled 2020-01-22: qty 2

## 2020-01-22 MED ORDER — KETOROLAC TROMETHAMINE 30 MG/ML IJ SOLN
INTRAMUSCULAR | Status: DC | PRN
  Administered 2020-01-22: 30 mg via INTRAVENOUS

## 2020-01-22 MED ORDER — GLYCOPYRROLATE 0.2 MG/ML IJ SOLN
INTRAMUSCULAR | Status: DC | PRN
  Administered 2020-01-22: .2 mg via INTRAVENOUS

## 2020-01-22 MED ORDER — ONDANSETRON HCL 4 MG/2ML IJ SOLN
INTRAMUSCULAR | Status: AC
  Filled 2020-01-22: qty 2

## 2020-01-22 MED ORDER — FAMOTIDINE 20 MG PO TABS: 20.0000 mg | ORAL_TABLET | Freq: Once | ORAL | Status: AC

## 2020-01-22 MED ORDER — LACTATED RINGERS IV SOLN
INTRAVENOUS | Status: DC
  Administered 2020-01-22: 125 mL/h via INTRAVENOUS

## 2020-01-22 SURGICAL SUPPLY — 55 items
ADH SKN CLS APL DERMABOND .7 (GAUZE/BANDAGES/DRESSINGS) ×1
APL PRP STRL LF DISP 70% ISPRP (MISCELLANEOUS) ×1
BAG DRN RND TRDRP ANRFLXCHMBR (UROLOGICAL SUPPLIES) ×1
BAG SPEC RTRVL LRG 6X4 10 (ENDOMECHANICALS)
BAG URINE DRAIN 2000ML AR STRL (UROLOGICAL SUPPLIES) ×3 IMPLANT
BLADE SURG SZ11 CARB STEEL (BLADE) ×3 IMPLANT
CATH FOLEY 2WAY  5CC 16FR (CATHETERS) ×2
CATH FOLEY 2WAY 5CC 16FR (CATHETERS) ×1
CATH URTH 16FR FL 2W BLN LF (CATHETERS) ×1 IMPLANT
CHLORAPREP W/TINT 26 (MISCELLANEOUS) ×3 IMPLANT
CLOSURE WOUND 1/4X4 (GAUZE/BANDAGES/DRESSINGS) ×1
COVER WAND RF STERILE (DRAPES) IMPLANT
DERMABOND ADVANCED (GAUZE/BANDAGES/DRESSINGS) ×2
DERMABOND ADVANCED .7 DNX12 (GAUZE/BANDAGES/DRESSINGS) ×1 IMPLANT
DRAPE GENERAL ENDO 106X123.5 (DRAPES) ×3 IMPLANT
DRAPE STERI POUCH LG 24X46 STR (DRAPES) IMPLANT
DRAPE UNDER BUTTOCK W/FLU (DRAPES) ×3 IMPLANT
GLOVE BIO SURGEON STRL SZ7 (GLOVE) ×6 IMPLANT
GLOVE INDICATOR 7.5 STRL GRN (GLOVE) ×3 IMPLANT
GOWN STRL REUS W/ TWL LRG LVL3 (GOWN DISPOSABLE) ×2 IMPLANT
GOWN STRL REUS W/TWL LRG LVL3 (GOWN DISPOSABLE) ×6
GRASPER SUT TROCAR 14GX15 (MISCELLANEOUS) ×3 IMPLANT
IRRIGATION STRYKERFLOW (MISCELLANEOUS) ×1 IMPLANT
IRRIGATOR STRYKERFLOW (MISCELLANEOUS) ×3
IV NS 1000ML (IV SOLUTION) ×3
IV NS 1000ML BAXH (IV SOLUTION) ×1 IMPLANT
KIT PINK PAD W/HEAD ARE REST (MISCELLANEOUS) ×3
KIT PINK PAD W/HEAD ARM REST (MISCELLANEOUS) ×1 IMPLANT
KIT TURNOVER CYSTO (KITS) ×3 IMPLANT
L-HOOK LAP DISP 36CM (ELECTROSURGICAL)
LABEL OR SOLS (LABEL) ×3 IMPLANT
LHOOK LAP DISP 36CM (ELECTROSURGICAL) IMPLANT
LIGASURE VESSEL 5MM BLUNT TIP (ELECTROSURGICAL) ×3 IMPLANT
NEEDLE FILTER BLUNT 18X 1/2SAF (NEEDLE) ×2
NEEDLE FILTER BLUNT 18X1 1/2 (NEEDLE) ×1 IMPLANT
NS IRRIG 500ML POUR BTL (IV SOLUTION) ×3 IMPLANT
PACK GYN LAPAROSCOPIC (MISCELLANEOUS) ×3 IMPLANT
PAD OB MATERNITY 4.3X12.25 (PERSONAL CARE ITEMS) ×3 IMPLANT
PAD PREP 24X41 OB/GYN DISP (PERSONAL CARE ITEMS) ×3 IMPLANT
PENCIL ELECTRO HAND CTR (MISCELLANEOUS) IMPLANT
POUCH ENDO CATCH 10MM SPEC (MISCELLANEOUS) ×3 IMPLANT
POUCH SPECIMEN RETRIEVAL 10MM (ENDOMECHANICALS) IMPLANT
SCISSORS METZENBAUM CVD 33 (INSTRUMENTS) IMPLANT
SET TUBE SMOKE EVAC HIGH FLOW (TUBING) ×3 IMPLANT
SLEEVE ENDOPATH XCEL 5M (ENDOMECHANICALS) ×3 IMPLANT
STRIP CLOSURE SKIN 1/4X4 (GAUZE/BANDAGES/DRESSINGS) ×2 IMPLANT
SUT MNCRL 4-0 (SUTURE) ×6
SUT MNCRL 4-0 27XMFL (SUTURE) ×2
SUT VIC AB 0 CT1 36 (SUTURE) ×3 IMPLANT
SUTURE MNCRL 4-0 27XMF (SUTURE) ×2 IMPLANT
SYR 50ML LL SCALE MARK (SYRINGE) IMPLANT
SYR 5ML LL (SYRINGE) ×3 IMPLANT
TROCAR XCEL NON-BLD 11X100MML (ENDOMECHANICALS) ×3 IMPLANT
TROCAR XCEL NON-BLD 5MMX100MML (ENDOMECHANICALS) ×9 IMPLANT
TUBING ART PRESS 48 MALE/FEM (TUBING) IMPLANT

## 2020-01-22 NOTE — Op Note (Addendum)
Amy Holland PROCEDURE DATE: 01/22/2020  PREOPERATIVE DIAGNOSIS: BRCA1, personal hx of breast cancer, not on tamoxifen, does not have a Lynch diagnosis  POSTOPERATIVE DIAGNOSIS: The same PROCEDURE: Risk-reducing  LAPAROSCOPIC BILATERAL SALPINGO OOPHORECTOMY:   Peritoneal lavage Inspection of the abdomen, pelvis and peritoneum  Omental biopsy  SURGEON:  Dr. Benjaman Kindler ASSISTANT: Dr. Laverta Baltimore, MD  Anesthesiologist:  Anesthesiologist: Piscitello, Precious Haws, MD CRNA: Lia Foyer, CRNA  INDICATIONS: 59 y.o. F here for definitive surgical management secondary to the indications listed under preoperative diagnoses; please see preoperative note for further details.  Risks of surgery were discussed with the patient including but not limited to: bleeding which may require transfusion or reoperation; infection which may require antibiotics; injury to bowel, bladder, ureters or other surrounding organs; need for additional procedures; thromboembolic phenomenon, incisional problems and other postoperative/anesthesia complications. Written informed consent was obtained.    FINDINGS:  Normal uterus and ovaries. Left tube disrupted from prior surgery. Right tube wnl. Normal appendix. Normal liver, diaphragm without studding, normal omentum. No evidence of malignancy on bowels, in posterior cul de sac.   ANESTHESIA:    General INTRAVENOUS FLUIDS: 700  ml ESTIMATED BLOOD LOSS:25 ml URINE OUTPUT: 1000 ml  SPECIMENS: Bilateral ovaries, peritoneal washings, omental biopsy COMPLICATIONS: None immediate  PROCEDURE IN DETAIL:  The patient received sequential compression devices applied to her lower extremities while in the preoperative area.  She was then taken to the operating room where general anesthesia was administered and was found to be adequate.  She was placed in the dorsal lithotomy position, and was prepped and draped in a sterile manner.  A formal time out was performed with all  team members present and in agreement.  An acorn uterine manipulator was placed at this time.  A Foley catheter was inserted into her bladder and attached to constant drainage. Attention was turned to the abdomen where an umbilical incision was made with the scalpel.  The Optiview 5-mm trocar and sleeve were then advanced without difficulty with the laparoscope under direct visualization into the abdomen.  The abdomen was then insufflated with carbon dioxide gas and adequate pneumoperitoneum was obtained.  A survey of the patient's pelvis and abdomen revealed the findings above.  Bilateral lower quadrant ports (5 mm on the right and 11 mm on the left) were then placed under direct visualization.  The pelvis was then carefully examined.  The ureters were noted to be safely away from the area of dissection.  Peritoneal washings taken.   Attention was turned to the IP ligaments. These were fulgurated with the Ligasure and ligated 2cm from the ovary, after dissecting away from the ureters, freeing the ovaries and fallopian tubes from the pelvic sidewall. The ovaries and tubes were placed in an Endocatch bag and were removed intact through the left lower quadrant port.  Overall excellent hemostasis was noted.    A random peritoneal biopsy was taken.  Attention was returned to the abdomen.The ureters were reexamined bilaterally and were pulsating normally. The abdominal pressure was reduced and hemostasis was confirmed.   The 86m port fascia was closed with a vertical mattress with 0-Vicryl, using the cone closure system. All trocars were removed under direct visualization, and the abdomen was desufflated.  All skin incisions were closed with 4-0 Vicryl subcuticular stitches and Dermabond. The patient tolerated the procedures well.  All instruments, needles, and sponge counts were correct x 2. The patient was taken to the recovery room awake, extubated and in stable condition.

## 2020-01-22 NOTE — Interval H&P Note (Signed)
History and Physical Interval Note:  01/22/2020 9:11 AM  Amy Holland  has presented today for surgery, with the diagnosis of BRCA - 1 Mutation; risk reduction.  The various methods of treatment have been discussed with the patient and family. After consideration of risks, benefits and other options for treatment, the patient has consented to  Procedure(s): LAPAROSCOPIC BILATERAL SALPINGO OOPHORECTOMY (Bilateral) as a surgical intervention.  The patient's history has been reviewed, patient examined, no change in status, stable for surgery.  I have reviewed the patient's chart and labs.  Questions were answered to the patient's satisfaction.     Benjaman Kindler

## 2020-01-22 NOTE — Anesthesia Preprocedure Evaluation (Signed)
Anesthesia Evaluation  Patient identified by MRN, date of birth, ID band Patient awake    Reviewed: Allergy & Precautions, H&P , NPO status , Patient's Chart, lab work & pertinent test results  History of Anesthesia Complications Negative for: history of anesthetic complications  Airway Mallampati: III  TM Distance: >3 FB Neck ROM: full    Dental  (+) Chipped   Pulmonary neg shortness of breath, former smoker,    Pulmonary exam normal        Cardiovascular Exercise Tolerance: Good (-) angina(-) Past MI and (-) DOE negative cardio ROS Normal cardiovascular exam     Neuro/Psych  Headaches, PSYCHIATRIC DISORDERS    GI/Hepatic negative GI ROS, Neg liver ROS,   Endo/Other  negative endocrine ROS  Renal/GU      Musculoskeletal   Abdominal   Peds  Hematology negative hematology ROS (+)   Anesthesia Other Findings Past Medical History: 10/13/2016: BRCA1 positive 1994: Breast cancer ([place])     Comment:  left lumpectomy with lymph node removal c Radiation and               chemo ER/PR negative, node negative 09/24/2015: Breast cancer of lower-outer quadrant of right female  breast ([place])     Comment:  Right breast, 7 o'clock:11 mm, histologic grade 2, T1c,               N0; ER/PR positive, HER-2/neu not overexpressed. Negative              margins No date: Colonoscopy refused     Comment:  Cologuard negative by patient report. No date: Depression No date: Headache     Comment:  migraine No date: Lymphedema of left arm 1994: Personal history of chemotherapy     Comment:  left breast ca 1994: Personal history of radiation therapy     Comment:  left breast ca 2017: Personal history of radiation therapy     Comment:  right breast ca  Past Surgical History: 08/12/2015: BREAST BIOPSY; Right     Comment:  invasive mammary carcinoma 1994: BREAST LUMPECTOMY; Left     Comment:  with lymph node removal Dr Amalia Hailey in  [place]  09/24/2015: BREAST LUMPECTOMY; Right     Comment:  breast cancer Right breast, 7 o'clock:11 mm, histologic               grade 2, T1c, N0; ER/PR positive, HER-2/neu not               overexpressed. Negative margins  LN negative 1989: ECTOPIC PREGNANCY SURGERY 09/24/2015: PARTIAL MASTECTOMY WITH AXILLARY SENTINEL LYMPH NODE  BIOPSY; Right     Comment:  Procedure: PARTIAL MASTECTOMY WITH AXILLARY SENTINEL               LYMPH NODE BIOPSY, WIDE EXCISION,               RECONSTRUCTION/MASTOPLASTY;  Surgeon: Robert Bellow,               MD;  Location: [facility] ORS;  Service: General;  Laterality:               Right;  BMI    Body Mass Index: 25.05 kg/m      Reproductive/Obstetrics negative OB ROS                             Anesthesia Physical Anesthesia Plan  ASA: II  Anesthesia Plan: General ETT  Post-op Pain Management:    Induction: Intravenous  PONV Risk Score and Plan: Ondansetron, Dexamethasone, Midazolam and Treatment may vary due to age or medical condition  Airway Management Planned: Oral ETT  Additional Equipment:   Intra-op Plan:   Post-operative Plan: Extubation in OR  Informed Consent: I have reviewed the patients History and Physical, chart, labs and discussed the procedure including the risks, benefits and alternatives for the proposed anesthesia with the patient or authorized representative who has indicated his/her understanding and acceptance.     Dental Advisory Given  Plan Discussed with: Anesthesiologist, CRNA and Surgeon  Anesthesia Plan Comments: (Patient consented for risks of anesthesia including but not limited to:  - adverse reactions to medications - damage to eyes, teeth, lips or other oral mucosa - nerve damage due to positioning  - sore throat or hoarseness - Damage to heart, brain, nerves, lungs, other parts of body or loss of life  Patient voiced understanding.)        Anesthesia Quick  Evaluation

## 2020-01-22 NOTE — Anesthesia Procedure Notes (Signed)
Procedure Name: Intubation Date/Time: 01/22/2020 9:58 AM Performed by: Lia Foyer, CRNA Pre-anesthesia Checklist: Patient identified, Emergency Drugs available, Suction available and Patient being monitored Patient Re-evaluated:Patient Re-evaluated prior to induction Oxygen Delivery Method: Circle system utilized Preoxygenation: Pre-oxygenation with 100% oxygen Induction Type: IV induction Ventilation: Mask ventilation without difficulty Laryngoscope Size: McGraph and 3 Grade View: Grade I Tube type: Oral Tube size: 7.0 mm Number of attempts: 1 Airway Equipment and Method: Stylet,  Oral airway and Video-laryngoscopy Placement Confirmation: ETT inserted through vocal cords under direct vision,  positive ETCO2 and breath sounds checked- equal and bilateral Secured at: 20 cm Tube secured with: Tape Dental Injury: Teeth and Oropharynx as per pre-operative assessment

## 2020-01-22 NOTE — Transfer of Care (Signed)
Immediate Anesthesia Transfer of Care Note  Patient: Amy Holland  Procedure(s) Performed: LAPAROSCOPIC BILATERAL SALPINGO OOPHORECTOMY (Bilateral Abdomen)  Patient Location: PACU  Anesthesia Type:General  Level of Consciousness: drowsy  Airway & Oxygen Therapy: Patient Spontanous Breathing and Patient connected to face mask oxygen  Post-op Assessment: Report given to RN and Post -op Vital signs reviewed and stable  Post vital signs: Reviewed and stable  Last Vitals:  Vitals Value Taken Time  BP 130/63 01/22/20 1145  Temp 36 C 01/22/20 1145  Pulse 65 01/22/20 1148  Resp 16 01/22/20 1148  SpO2 100 % 01/22/20 1148  Vitals shown include unvalidated device data.  Last Pain:  Vitals:   01/22/20 0820  TempSrc: Tympanic  PainSc: 0-No pain         Complications: No complications documented.

## 2020-01-22 NOTE — Discharge Instructions (Addendum)
Laparoscopic Ovarian Surgery Discharge Instructions  For the next three days, take ibuprofen and acetaminophen on a schedule, every 8 hours. You can take them together or you can intersperse them, and take one every four hours. I also gave you gabapentin for nighttime, to help you sleep and also to control pain. Take gabapentin medicines at night for at least the next 3 nights. You also have a narcotic, oxycodone, to take as needed if the above medicines don't help.  Postop constipation is a major cause of pain. Stay well hydrated, walk as you tolerate, and take over the counter senna as well as stool softeners if you need them.   RISKS AND COMPLICATIONS   Infection.  Bleeding.  Injury to surrounding organs.  Anesthetic side effects.   PROCEDURE   You may be given a medicine to help you relax (sedative) before the procedure. You will be given a medicine to make you sleep (general anesthetic) during the procedure.  A tube will be put down your throat to help your breath while under general anesthesia.  Several small cuts (incisions) are made in the lower abdominal area and one incision is made near the belly button.  Your abdominal area will be inflated with a safe gas (carbon dioxide). This helps give the surgeon room to operate, visualize, and helps the surgeon avoid other organs.  A thin, lighted tube (laparoscope) with a camera attached is inserted into your abdomen through the incision near the belly button. Other small instruments may also be inserted through other abdominal incisions.  The ovary is located and are removed.  After the ovary is removed, the gas is released from the abdomen.  The incisions will be closed with stitches (sutures), and Dermabond. A bandage may be placed over the incisions.  AFTER THE PROCEDURE   You will also have some mild abdominal discomfort for 3-7 days. You will be given pain medicine to ease any discomfort.  As long as there are no  problems, you may be allowed to go home. Someone will need to drive you home and be with you for at least 24 hours once home.  You may have some mild discomfort in the throat. This is from the tube placed in your throat while you were sleeping.  You may experience discomfort in the shoulder area from some trapped air between the liver and diaphragm. This sensation is normal and will slowly go away on its own.  HOME CARE INSTRUCTIONS   Take all medicines as directed.  Only take over-the-counter or prescription medicines for pain, discomfort, or fever as directed by your caregiver.  Resume daily activities as directed.  Showers are preferred over baths for 2 weeks.  You may resume sexual activities in 1 week or as you feel you would like to.  Do not drive while taking narcotics.  SEEK MEDICAL CARE IF: .  There is increasing abdominal pain.  You feel lightheaded or faint.  You have the chills.  You have an oral temperature above 102 F (38.9 C).  There is pus-like (purulent) drainage from any of the wounds.  You are unable to pass gas or have a bowel movement.  You feel sick to your stomach (nauseous) or throw up (vomit) and can't control it with your medicines.  MAKE SURE YOU:   Understand these instructions.  Will watch your condition.  Will get help right away if you are not doing well or get worse.  ExitCare Patient Information 2013 [employer]     AMBULATORY SURGERY  DISCHARGE INSTRUCTIONS   1) The drugs that you were given will stay in your system until tomorrow so for the next 24 hours you should not:  A) Drive an automobile B) Make any legal decisions C) Drink any alcoholic beverage   2) You may resume regular meals tomorrow.  Today it is better to start with liquids and gradually work up to solid foods.  You may eat anything you prefer, but it is better to start with liquids, then soup and crackers, and gradually work up to solid  foods.   3) Please notify your doctor immediately if you have any unusual bleeding, trouble breathing, redness and pain at the surgery site, drainage, fever, or pain not relieved by medication.    4) Additional Instructions:        Please contact your physician with any problems or Same Day Surgery at [phone], Monday through Friday 6 am to 4 pm, or [place] [facility] [place] Main number at [phone].   

## 2020-01-22 NOTE — OR Nursing (Signed)
Awaiting lab for ABO.

## 2020-01-22 NOTE — Anesthesia Postprocedure Evaluation (Signed)
Anesthesia Post Note  Patient: Amy Holland  Procedure(s) Performed: LAPAROSCOPIC BILATERAL SALPINGO OOPHORECTOMY (Bilateral Abdomen)  Patient location during evaluation: PACU Anesthesia Type: General Level of consciousness: awake and alert Pain management: pain level controlled Vital Signs Assessment: post-procedure vital signs reviewed and stable Respiratory status: spontaneous breathing, nonlabored ventilation, respiratory function stable and patient connected to nasal cannula oxygen Cardiovascular status: blood pressure returned to baseline and stable Postop Assessment: no apparent nausea or vomiting Anesthetic complications: no   No complications documented.   Last Vitals:  Vitals:   01/22/20 1230 01/22/20 1241  BP: 114/80 129/71  Pulse: 69 70  Resp: 14 16  Temp: (!) 36.3 C 36.7 C  SpO2: 100% 99%    Last Pain:  Vitals:   01/22/20 1241  TempSrc: Temporal  PainSc: 0-No pain                 Precious Haws Diane Mochizuki

## 2020-01-23 ENCOUNTER — Encounter: Payer: Self-pay | Admitting: Obstetrics and Gynecology

## 2020-01-23 LAB — CYTOLOGY - NON PAP

## 2020-01-24 LAB — SURGICAL PATHOLOGY

## 2020-03-04 ENCOUNTER — Telehealth: Payer: Self-pay | Admitting: *Deleted

## 2020-03-04 DIAGNOSIS — A084 Viral intestinal infection, unspecified: Secondary | ICD-10-CM

## 2020-03-04 NOTE — Telephone Encounter (Signed)
RN notified by VM left by pt of pt calling out of work today. RN returned call and spoke with pt. She reports sx that started AM of Saturday 9/25 and have progressively worsened. Diarrhea, fever (last check this AM 102.7), body aches, chills, sore throat, sinus pressure, and fatigue.  She has already made a covid testing appt at [facility] for 9/28. Advised to keep this appt. Plan for 10 day quarantine for now.  Last day on-site: 03/01/20 Day 1 of Sx: 03/02/20 Day 1 of quarantine: 03/03/20 Testing between sx days 3-5 Test scheduled 9/28 at [facility] [address] at [place] Day 10 quarantine: 03/12/20 RTW date: 03/13/20 **Subject to change to 7 day quarantine if test is negative and sx improve with no fever, v/d within 24 hours of return.  Reviewed home management of current sx. She is currently only using Tylenol for fever and body aches. Advised she can use phenylephrine which she already has at home for the sinus pressure. Flonase and nasal saline spray for drainage and congestion. Honey lemon tea, chloraseptic spray, and cough drops for sore throat. Imodium prn for diarrhea.   Pt verbalizes understanding and agreement with plan of care. No further questions/concerns. Plan to f/u 9/30 for sx recheck and anticipated test result. Pt also aware of clinic being open tomorrow and contact methods for clinic staff if sx worsen prior to planned follow up.

## 2020-03-05 NOTE — Telephone Encounter (Signed)
Noted agree with plan of care 

## 2020-03-06 NOTE — Telephone Encounter (Signed)
Telephone message left for patient checking in to see if fever and diarrhea improved may email me at [email or web address].  RN Hildred Alamin and I in clinic tomorrow [phone].

## 2020-03-07 NOTE — Telephone Encounter (Signed)
RN spoke with pt earlier today. She reports feeling better overall but still feels "punie"/weak, fatigued. No v/d today. Negative Covid test. As long as no recurring sx over the weekend or new sx, pt reduced to 7 day quarantine and able to RTW onsite on Monday 10/4 after completing quarantine on Sat 10/2. Pt agreeable and HR made aware as well.

## 2020-03-07 NOTE — Telephone Encounter (Signed)
Patient sent message to me at 03/06/2020 "I am feeling better and my temperature has finally registered a normal 98.5 this morning after a high of 102.7 Monday morning.  Hildred Alamin suggested immodium for my diarrhea symptoms and that has helped immensely.  I am still awaiting results of the covid test I took yesterday at [facility].  As soon as I get the results I will update you"  I replied to patient last night at 2008 "Kelie so glad your fever broke and the immodium helping with the diarrhea.  Keep sipping fluids like gatorade/powerade/soup broth/pediatlye/water as very easy to get dehydrted with fever and diarrhea.  RN Hildred Alamin or I will be calling you tomorrow."  Patient then sent message this morning "covid test negative and wondering if she needs doctor's note to return"  HR Tim replied to her and clinic staff that she did not need doctor's note but RN Hildred Alamin or I needed to clear her prior to returning to work onsite.  He stated if symptoms improving can probably return to work onsite Monday with the negative covid test.

## 2020-03-07 NOTE — Telephone Encounter (Signed)
Noted plan to continue quarantine this week and return next Monday to work.

## 2020-03-09 NOTE — Telephone Encounter (Signed)
Patient reported today feeling better some fatigue but improved.  Mostly intake fluid based soups and breads but hasn't resumed full diet.  "feeling so much better" Diarrhea has finally subsided.  Patient to email me if symptoms worsening tomorrow at [email or web address]  Plan RTW 11 Mar 2020.  Patient denied n/v/d/f/c in previous 24 hours or new symptoms.  Patient agreed with plan of care and had no further questions at this time.

## 2020-03-10 NOTE — Telephone Encounter (Signed)
Telephone message left for patient checking in to ensure symptoms continued to improve and no n/v/d/f/c in past 24 hours prior to her return to work tomorrow.  Notified her RN Hildred Alamin in clinic tomorrow [phone]

## 2020-03-11 NOTE — Telephone Encounter (Signed)
Pt emailed RN this morning reporting that she is back on-site and "feeling almost normal." Appreciates the follow up and care received while she was out. No further needs or concerns.

## 2020-03-12 ENCOUNTER — Encounter: Payer: Self-pay | Admitting: *Deleted

## 2020-03-12 NOTE — Telephone Encounter (Signed)
Noted RTW as expected and feeling almost back to normal

## 2020-05-09 ENCOUNTER — Ambulatory Visit: Payer: Self-pay | Admitting: Registered Nurse

## 2020-05-09 ENCOUNTER — Other Ambulatory Visit: Payer: Self-pay

## 2020-05-09 ENCOUNTER — Encounter: Payer: Self-pay | Admitting: Registered Nurse

## 2020-05-09 VITALS — BP 142/90 | HR 76 | Temp 98.2°F | Resp 16 | Wt 150.0 lb

## 2020-05-09 DIAGNOSIS — R030 Elevated blood-pressure reading, without diagnosis of hypertension: Secondary | ICD-10-CM

## 2020-05-09 DIAGNOSIS — R109 Unspecified abdominal pain: Secondary | ICD-10-CM

## 2020-05-09 LAB — POCT URINALYSIS DIPSTICK
Bilirubin, UA: NEGATIVE
Blood, UA: NEGATIVE
Glucose, UA: NEGATIVE mg/dL
Ketones, POC UA: NEGATIVE mg/dL
Nitrite, UA: NEGATIVE
Protein Ur, POC: NEGATIVE mg/dL
Specific Gravity, UA: 1.02 (ref 1.005–1.030)
Urobilinogen, UA: 0.2 E.U./dL
pH, UA: 7 (ref 5.0–8.0)

## 2020-05-09 MED ORDER — SULFAMETHOXAZOLE-TRIMETHOPRIM 800-160 MG PO TABS: 1.0000 | ORAL_TABLET | Freq: Two times a day (BID) | ORAL | 0 refills | Status: AC

## 2020-05-09 MED ORDER — PHENAZOPYRIDINE HCL 200 MG PO TABS: 200.0000 mg | ORAL_TABLET | Freq: Three times a day (TID) | ORAL | 0 refills | Status: DC

## 2020-05-09 NOTE — Patient Instructions (Signed)
Flank Pain, Adult Flank pain is pain that is located on the side of the body between the upper abdomen and the back. This area is called the flank. The pain may occur over a short period of time (acute), or it may be long-term or recurring (chronic). It may be mild or severe. Flank pain can be caused by many things, including:  Muscle soreness or injury.  Kidney stones or kidney disease.  Stress.  A disease of the spine (vertebral disk disease).  A lung infection (pneumonia).  Fluid around the lungs (pulmonary edema).  A skin rash caused by the chickenpox virus (shingles).  Tumors that affect the back of the abdomen.  Gallbladder disease. Follow these instructions at home:   Drink enough fluid to keep your urine clear or pale yellow.  Rest as told by your health care provider.  Take over-the-counter and prescription medicines only as told by your health care provider.  Keep a journal to track what has caused your flank pain and what has made it feel better.  Keep all follow-up visits as told by your health care provider. This is important. Contact a health care provider if:  Your pain is not controlled with medicine.  You have new symptoms.  Your pain gets worse.  You have a fever.  Your symptoms last longer than 2-3 days.  You have trouble urinating or you are urinating very frequently. Get help right away if:  You have trouble breathing or you are short of breath.  Your abdomen hurts or it is swollen or red.  You have nausea or vomiting.  You feel faint or you pass out.  You have blood in your urine. Summary  Flank pain is pain that is located on the side of the body between the upper abdomen and the back.  The pain may occur over a short period of time (acute), or it may be long-term or recurring (chronic). It may be mild or severe.  Flank pain can be caused by many things.  Contact your health care provider if your symptoms get worse or they last  longer than 2-3 days. This information is not intended to replace advice given to you by your health care provider. Make sure you discuss any questions you have with your health care provider. Document Revised: 05/07/2017 Document Reviewed: 08/07/2016 Elsevier Patient Education  [place]. Urinary Tract Infection, Adult  A urinary tract infection (UTI) is an infection of any part of the urinary tract. The urinary tract includes the kidneys, ureters, bladder, and urethra. These organs make, store, and get rid of urine in the body. Your health care provider may use other names to describe the infection. An upper UTI affects the ureters and kidneys (pyelonephritis). A lower UTI affects the bladder (cystitis) and urethra (urethritis). What are the causes? Most urinary tract infections are caused by bacteria in your genital area, around the entrance to your urinary tract (urethra). These bacteria grow and cause inflammation of your urinary tract. What increases the risk? You are more likely to develop this condition if:  You have a urinary catheter that stays in place (indwelling).  You are not able to control when you urinate or have a bowel movement (you have incontinence).  You are female and you: ? Use a spermicide or diaphragm for birth control. ? Have low estrogen levels. ? Are pregnant.  You have certain genes that increase your risk (genetics).  You are sexually active.  You take antibiotic medicines.  You have a condition that causes your flow of urine to slow down, such as: ? An enlarged prostate, if you are female. ? Blockage in your urethra (stricture). ? A kidney stone. ? A nerve condition that affects your bladder control (neurogenic bladder). ? Not getting enough to drink, or not urinating often.  You have certain medical conditions, such as: ? Diabetes. ? A weak disease-fighting system (immunesystem). ? Sickle cell disease. ? Gout. ? Spinal cord injury. What  are the signs or symptoms? Symptoms of this condition include:  Needing to urinate right away (urgently).  Frequent urination or passing small amounts of urine frequently.  Pain or burning with urination.  Blood in the urine.  Urine that smells bad or unusual.  Trouble urinating.  Cloudy urine.  Vaginal discharge, if you are female.  Pain in the abdomen or the lower back. You may also have:  Vomiting or a decreased appetite.  Confusion.  Irritability or tiredness.  A fever.  Diarrhea. The first symptom in older adults may be confusion. In some cases, they may not have any symptoms until the infection has worsened. How is this diagnosed? This condition is diagnosed based on your medical history and a physical exam. You may also have other tests, including:  Urine tests.  Blood tests.  Tests for sexually transmitted infections (STIs). If you have had more than one UTI, a cystoscopy or imaging studies may be done to determine the cause of the infections. How is this treated? Treatment for this condition includes:  Antibiotic medicine.  Over-the-counter medicines to treat discomfort.  Drinking enough water to stay hydrated. If you have frequent infections or have other conditions such as a kidney stone, you may need to see a health care provider who specializes in the urinary tract (urologist). In rare cases, urinary tract infections can cause sepsis. Sepsis is a life-threatening condition that occurs when the body responds to an infection. Sepsis is treated in the hospital with IV antibiotics, fluids, and other medicines. Follow these instructions at home:  Medicines  Take over-the-counter and prescription medicines only as told by your health care provider.  If you were prescribed an antibiotic medicine, take it as told by your health care provider. Do not stop using the antibiotic even if you start to feel better. General instructions  Make sure  you: ? Empty your bladder often and completely. Do not hold urine for long periods of time. ? Empty your bladder after sex. ? Wipe from front to back after a bowel movement if you are female. Use each tissue one time when you wipe.  Drink enough fluid to keep your urine pale yellow.  Keep all follow-up visits as told by your health care provider. This is important. Contact a health care provider if:  Your symptoms do not get better after 1-2 days.  Your symptoms go away and then return. Get help right away if you have:  Severe pain in your back or your lower abdomen.  A fever.  Nausea or vomiting. Summary  A urinary tract infection (UTI) is an infection of any part of the urinary tract, which includes the kidneys, ureters, bladder, and urethra.  Most urinary tract infections are caused by bacteria in your genital area, around the entrance to your urinary tract (urethra).  Treatment for this condition often includes antibiotic medicines.  If you were prescribed an antibiotic medicine, take it as told by your health care provider. Do not stop using the antibiotic even if you  start to feel better.  Keep all follow-up visits as told by your health care provider. This is important. This information is not intended to replace advice given to you by your health care provider. Make sure you discuss any questions you have with your health care provider. Document Revised: 05/12/2018 Document Reviewed: 12/02/2017 Elsevier Patient Education  2020 [place]. Dietary Guidelines to Help Prevent Kidney Stones Kidney stones are deposits of minerals and salts that form inside your kidneys. Your risk of developing kidney stones may be greater depending on your diet, your lifestyle, the medicines you take, and whether you have certain medical conditions. Most people can reduce their chances of developing kidney stones by following the instructions below. Depending on your overall health and the  type of kidney stones you tend to develop, your dietitian may give you more specific instructions. What are tips for following this plan? Reading food labels  Choose foods with "no salt added" or "low-salt" labels. Limit your sodium intake to less than 1500 mg per day.  Choose foods with calcium for each meal and snack. Try to eat about 300 mg of calcium at each meal. Foods that contain 200-500 mg of calcium per serving include: ? 8 oz (237 ml) of milk, fortified nondairy milk, and fortified fruit juice. ? 8 oz (237 ml) of kefir, yogurt, and soy yogurt. ? 4 oz (118 ml) of tofu. ? 1 oz of cheese. ? 1 cup (300 g) of dried figs. ? 1 cup (91 g) of cooked broccoli. ? 1-3 oz can of sardines or mackerel.  Most people need 1000 to 1500 mg of calcium each day. Talk to your dietitian about how much calcium is recommended for you. Shopping  Buy plenty of fresh fruits and vegetables. Most people do not need to avoid fruits and vegetables, even if they contain nutrients that may contribute to kidney stones.  When shopping for convenience foods, choose: ? Whole pieces of fruit. ? Premade salads with dressing on the side. ? Low-fat fruit and yogurt smoothies.  Avoid buying frozen meals or prepared deli foods.  Look for foods with live cultures, such as yogurt and kefir. Cooking  Do not add salt to food when cooking. Place a salt shaker on the table and allow each person to add his or her own salt to taste.  Use vegetable protein, such as beans, textured vegetable protein (TVP), or tofu instead of meat in pasta, casseroles, and soups. Meal planning   Eat less salt, if told by your dietitian. To do this: ? Avoid eating processed or premade food. ? Avoid eating fast food.  Eat less animal protein, including cheese, meat, poultry, or fish, if told by your dietitian. To do this: ? Limit the number of times you have meat, poultry, fish, or cheese each week. Eat a diet free of meat at least 2 days  a week. ? Eat only one serving each day of meat, poultry, fish, or seafood. ? When you prepare animal protein, cut pieces into small portion sizes. For most meat and fish, one serving is about the size of one deck of cards.  Eat at least 5 servings of fresh fruits and vegetables each day. To do this: ? Keep fruits and vegetables on hand for snacks. ? Eat 1 piece of fruit or a handful of berries with breakfast. ? Have a salad and fruit at lunch. ? Have two kinds of vegetables at dinner.  Limit foods that are high in a substance called  oxalate. These include: ? Spinach. ? Rhubarb. ? Beets. ? Potato chips and french fries. ? Nuts.  If you regularly take a diuretic medicine, make sure to eat at least 1-2 fruits or vegetables high in potassium each day. These include: ? Avocado. ? Banana. ? Orange, prune, carrot, or tomato juice. ? Baked potato. ? Cabbage. ? Beans and split peas. General instructions   Drink enough fluid to keep your urine clear or pale yellow. This is the most important thing you can do.  Talk to your health care provider and dietitian about taking daily supplements. Depending on your health and the cause of your kidney stones, you may be advised: ? Not to take supplements with vitamin C. ? To take a calcium supplement. ? To take a daily probiotic supplement. ? To take other supplements such as magnesium, fish oil, or vitamin B6.  Take all medicines and supplements as told by your health care provider.  Limit alcohol intake to no more than 1 drink a day for nonpregnant women and 2 drinks a day for men. One drink equals 12 oz of beer, 5 oz of wine, or 1 oz of hard liquor.  Lose weight if told by your health care provider. Work with your dietitian to find strategies and an eating plan that works best for you. What foods are not recommended? Limit your intake of the following foods, or as told by your dietitian. Talk to your dietitian about specific foods you  should avoid based on the type of kidney stones and your overall health. Grains Breads. Bagels. Rolls. Baked goods. Salted crackers. Cereal. Pasta. Vegetables Spinach. Rhubarb. Beets. Canned vegetables. Angie Fava. Olives. Meats and other protein foods Nuts. Nut butters. Large portions of meat, poultry, or fish. Salted or cured meats. Deli meats. Hot dogs. Sausages. Dairy Cheese. Beverages Regular soft drinks. Regular vegetable juice. Seasonings and other foods Seasoning blends with salt. Salad dressings. Canned soups. Soy sauce. Ketchup. Barbecue sauce. Canned pasta sauce. Casseroles. Pizza. Lasagna. Frozen meals. Potato chips. [place] fries. Summary  You can reduce your risk of kidney stones by making changes to your diet.  The most important thing you can do is drink enough fluid. You should drink enough fluid to keep your urine clear or pale yellow.  Ask your health care provider or dietitian how much protein from animal sources you should eat each day, and also how much salt and calcium you should have each day. This information is not intended to replace advice given to you by your health care provider. Make sure you discuss any questions you have with your health care provider. Document Revised: 09/14/2018 Document Reviewed: 05/05/2016 Elsevier Patient Education  2020 [place]. Kidney Stones  Kidney stones are solid, rock-like deposits that form inside of the kidneys. The kidneys are a pair of organs that make urine. A kidney stone may form in a kidney and move into other parts of the urinary tract, including the tubes that connect the kidneys to the bladder (ureters), the bladder, and the tube that carries urine out of the body (urethra). As the stone moves through these areas, it can cause intense pain and block the flow of urine. Kidney stones are created when high levels of certain minerals are found in the urine. The stones are usually passed out of the body through urination,  but in some cases, medical treatment may be needed to remove them. What are the causes? Kidney stones may be caused by:  A condition in which certain  glands produce too much parathyroid hormone (primary hyperparathyroidism), which causes too much calcium buildup in the blood.  A buildup of uric acid crystals in the bladder (hyperuricosuria). Uric acid is a chemical that the body produces when you eat certain foods. It usually exits the body in the urine.  Narrowing (stricture) of one or both of the ureters.  A kidney blockage that is present at birth (congenital obstruction).  Past surgery on the kidney or the ureters, such as gastric bypass surgery. What increases the risk? The following factors may make you more likely to develop this condition:  Having had a kidney stone in the past.  Having a family history of kidney stones.  Not drinking enough water.  Eating a diet that is high in protein, salt (sodium), or sugar.  Being overweight or obese. What are the signs or symptoms? Symptoms of a kidney stone may include:  Pain in the side of the abdomen, right below the ribs (flank pain). Pain usually spreads (radiates) to the groin.  Needing to urinate frequently or urgently.  Painful urination.  Blood in the urine (hematuria).  Nausea.  Vomiting.  Fever and chills. How is this diagnosed? This condition may be diagnosed based on:  Your symptoms and medical history.  A physical exam.  Blood tests.  Urine tests. These may be done before and after the stone passes out of your body through urination.  Imaging tests, such as a CT scan, abdominal X-ray, or ultrasound.  A procedure to examine the inside of the bladder (cystoscopy). How is this treated? Treatment for kidney stones depends on the size, location, and makeup of the stones. Kidney stones will often pass out of the body through urination. You may need to:  Increase your fluid intake to help pass the stone.  In some cases, you may be given fluids through an IV and may need to be monitored at the hospital.  Take medicine for pain.  Make changes in your diet to help prevent kidney stones from coming back. Sometimes, medical procedures are needed to remove a kidney stone. This may involve:  A procedure to break up kidney stones using: ? A focused beam of light (laser therapy). ? Shock waves (extracorporeal shock wave lithotripsy).  Surgery to remove kidney stones. This may be needed if you have severe pain or have stones that block your urinary tract. Follow these instructions at home: Medicines  Take over-the-counter and prescription medicines only as told by your health care provider.  Ask your health care provider if the medicine prescribed to you requires you to avoid driving or using heavy machinery. Eating and drinking  Drink enough fluid to keep your urine pale yellow. You may be instructed to drink at least 8-10 glasses of water each day. This will help you pass the kidney stone.  If directed, change your diet. This may include: ? Limiting how much sodium you eat. ? Eating more fruits and vegetables. ? Limiting how much animal protein--such as red meat, poultry, fish, and eggs--you eat.  Follow instructions from your health care provider about eating or drinking restrictions. General instructions  Collect urine samples as told by your health care provider. You may need to collect a urine sample: ? 24 hours after you pass the stone. ? 8-12 weeks after passing the kidney stone, and every 6-12 months after that.  Strain your urine every time you urinate, for as long as directed. Use the strainer that your health care provider recommends.  Do  not throw out the kidney stone after passing it. Keep the stone so it can be tested by your health care provider. Testing the makeup of your kidney stone may help prevent you from getting kidney stones in the future.  Keep all follow-up visits  as told by your health care provider. This is important. You may need follow-up X-rays or ultrasounds to make sure that your stone has passed. How is this prevented? To prevent another kidney stone:  Drink enough fluid to keep your urine pale yellow. This is the best way to prevent kidney stones.  Eat a healthy diet and follow recommendations from your health care provider about foods to avoid. You may be instructed to eat a low-protein diet. Recommendations vary depending on the type of kidney stone that you have.  Maintain a healthy weight. Where to find more information  [place] [facility]): www.[email or web address] [facility]): [email or web address].urologyhealth.org Contact a health care provider if:  You have pain that gets worse or does not get better with medicine. Get help right away if:  You have a fever or chills.  You develop severe pain.  You develop new abdominal pain.  You faint.  You are unable to urinate. Summary  Kidney stones are solid, rock-like deposits that form inside of the kidneys.  Kidney stones can cause nausea, vomiting, blood in the urine, abdominal pain, and the urge to urinate frequently.  Treatment for kidney stones depends on the size, location, and makeup of the stones. Kidney stones will often pass out of the body through urination.  Kidney stones can be prevented by drinking enough fluids, eating a healthy diet, and maintaining a healthy weight. This information is not intended to replace advice given to you by your health care provider. Make sure you discuss any questions you have with your health care provider. Document Revised: 10/11/2018 Document Reviewed: 10/11/2018 Elsevier Patient Education  [place].

## 2020-05-09 NOTE — Progress Notes (Signed)
Subjective:    Patient ID: Amy Holland, female    DOB: 1961/01/15, 59 y.o.   MRN: [id number]  59y/o Caucasian established female pt c/o lower back and bilateral flank pain x1 week. Also with mild low abd discomfort. Reports last time she felt like this, she had a UTI. Denies dysuria sx. UA dip performed in clinic with trace leukocytes and otherwise normal.  Denied fever/chills/n/v/d/gross hematuria/smelly urine/cloudy urine.     Review of Systems  Constitutional: Negative for activity change, appetite change, chills, diaphoresis, fatigue and fever.  HENT: Negative for trouble swallowing.   Eyes: Negative for photophobia and visual disturbance.  Respiratory: Negative for cough, shortness of breath, wheezing and stridor.   Cardiovascular: Negative for leg swelling.  Gastrointestinal: Positive for abdominal pain. Negative for diarrhea, nausea and vomiting.  Endocrine: Negative for cold intolerance and heat intolerance.  Genitourinary: Positive for flank pain. Negative for decreased urine volume, difficulty urinating, enuresis, hematuria, menstrual problem, vaginal bleeding, vaginal discharge and vaginal pain.  Musculoskeletal: Positive for back pain and myalgias. Negative for gait problem, neck pain and neck stiffness.  Allergic/Immunologic: Negative for food allergies.  Neurological: Negative for dizziness, tremors, syncope, weakness and headaches.  Hematological: Negative for adenopathy. Does not bruise/bleed easily.  Psychiatric/Behavioral: Negative for agitation, confusion and sleep disturbance.       Objective:   Physical Exam Constitutional:      General: She is awake. She is not in acute distress.    Appearance: Normal appearance. She is well-developed, well-groomed and normal weight. She is not ill-appearing, toxic-appearing or diaphoretic.  HENT:     Head: Normocephalic and atraumatic.     Jaw: There is normal jaw occlusion.     Right Ear: Hearing and external ear normal.      Left Ear: Hearing and external ear normal.     Nose: Nose normal. No congestion or rhinorrhea.     Mouth/Throat:     Pharynx: Oropharynx is clear.  Eyes:     General: Lids are normal. Vision grossly intact. Gaze aligned appropriately. No visual field deficit or scleral icterus.       Right eye: No discharge.        Left eye: No discharge.     Extraocular Movements: Extraocular movements intact.     Conjunctiva/sclera: Conjunctivae normal.     Pupils: Pupils are equal, round, and reactive to light.  Neck:     Trachea: Trachea and phonation normal.  Cardiovascular:     Rate and Rhythm: Normal rate and regular rhythm.     Pulses: Normal pulses.          Radial pulses are 2+ on the right side and 2+ on the left side.     Heart sounds: Normal heart sounds.  Pulmonary:     Effort: Pulmonary effort is normal. No respiratory distress.     Breath sounds: Normal breath sounds and air entry. No stridor or transmitted upper airway sounds. No wheezing.     Comments: Spoke full sentences without difficulty; no cough observed in exam room; wearing mask due to covid 19 pandemic Abdominal:     General: Abdomen is flat. Bowel sounds are decreased. There is no distension or abdominal bruit. There are no signs of injury.     Palpations: Abdomen is soft. There is no shifting dullness, fluid wave, hepatomegaly, splenomegaly, mass or pulsatile mass.     Tenderness: There is no right CVA tenderness, left CVA tenderness, guarding or rebound. Negative signs include Murphy's sign.  Hernia: No hernia is present. There is no hernia in the umbilical area or ventral area.     Comments: Dull to percussion x 4 quads; hypoactive bowel sounds; abdomen soft  Musculoskeletal:        General: No swelling, tenderness, deformity or signs of injury. Normal range of motion.     Right shoulder: Normal.     Left shoulder: Normal.     Right elbow: Normal.     Left elbow: Normal.     Right hand: Normal.     Left hand:  Normal.     Cervical back: Normal, normal range of motion and neck supple. No rigidity or tenderness.     Thoracic back: Normal. No swelling, edema, deformity, signs of trauma, lacerations, spasms, tenderness or bony tenderness. Normal range of motion. No scoliosis.     Lumbar back: Normal. No swelling, edema, deformity, signs of trauma, lacerations, spasms, tenderness or bony tenderness. Normal range of motion. No scoliosis.     Right hip: Normal.     Left hip: Normal.     Right knee: Normal.     Left knee: Normal.     Right lower leg: Normal. No edema.     Left lower leg: Normal. No edema.     Comments: Flank pain unchanged with palpation/percussion left   Lymphadenopathy:     Head:     Right side of head: No submental or preauricular adenopathy.     Left side of head: No submental or preauricular adenopathy.     Cervical: No cervical adenopathy.     Right cervical: No superficial cervical adenopathy.    Left cervical: No superficial cervical adenopathy.  Skin:    General: Skin is warm and dry.     Capillary Refill: Capillary refill takes less than 2 seconds.     Coloration: Skin is not ashen, cyanotic, jaundiced, mottled, pale or sallow.     Findings: No abrasion, abscess, acne, bruising, burn, ecchymosis, erythema, signs of injury, laceration, lesion, petechiae, rash or wound.     Nails: There is no clubbing.  Neurological:     General: No focal deficit present.     Mental Status: She is alert and oriented to person, place, and time. Mental status is at baseline.     GCS: GCS eye subscore is 4. GCS verbal subscore is 5. GCS motor subscore is 6.     Cranial Nerves: Cranial nerves are intact. No cranial nerve deficit, dysarthria or facial asymmetry.     Sensory: Sensation is intact. No sensory deficit.     Motor: Motor function is intact. No weakness, tremor, atrophy, abnormal muscle tone or seizure activity.     Coordination: Coordination is intact. Coordination normal.     Gait:  Gait is intact. Gait normal.     Comments: Gait sure and steady in clinic; in/out of chair and on/off exam table without difficulty; bilateral hand grasp equal 5/5; standing to sitting to supine and reverse quickly  Psychiatric:        Attention and Perception: Attention and perception normal.        Mood and Affect: Mood and affect normal.        Speech: Speech normal.        Behavior: Behavior normal. Behavior is cooperative.        Thought Content: Thought content normal.        Cognition and Memory: Cognition and memory normal.        Judgment: Judgment normal.  patient notified of dipstick urinalysis results verbally at office visit trace leukocytes slightly concentrated     Assessment & Plan:  A-Flank pain and elevated blood pressure  P-Trace leukocytes with flank pain will treat for UTI urine culture sent to [facility] typically results available in 3 days will call if antibiotic resistance  Medications as directed. Bactrim DS po BID x 3 days #40 RF0 dispensed from [facility] to patient (will have leftover pills do not take all).  Patient is also to push fluids and may use Pyridium 200mg  po TID x 2 days as needed burning/bladder spasms/urgency/frequency.  Discussed will make urine dark gold/orange and urine will stain porous materials/clothes/skin. Hydrate, avoid dehydration. Avoid holding urine void on frequent basis every 2 to 4 hours when awake. If unable to void every 8 hours, repetitive vomiting, worsening back/abdomen pain, fever/chills seek follow up for re-evaluation with PCM, urgent care or ER same day. Call or return to clinic as needed if these symptoms worsen or fail to improve as anticipated should be improving with 48 hours of antibiotics and pushing noncaffeinated beverages preferably water.  Discussed signs and symptoms or UTI versus kidney stones.  Discussed not all bladder symptoms are UTI.  May use OTC NSAID motrin/ibuprofen 800mg  po every 8 hours with food or tylenol 1000mg  po  every 6 hours as needed for pain. Exitcare handout on flank pain, UTI, kidney stones and diet to prevent kidney stones  Patient verbalized agreement and understanding of treatment plan and had no further questions at this time.  P2: Hydrate and cranberry juice   Elevated blood pressure, acute pain.  Follow up with RN Hildred Alamin in 2 weeks repeat BP.  Noted in Epic PCM recommended weight loss/exercise at annual visit.  Discussed with patient and she stated she has been exercising and trying to lose weight.  Encouraged her to continue efforts and follow up with PCM.  Patient verbalized understanding information/instructions, agreed with plan of care and had no further questions at this time.

## 2020-05-12 LAB — URINE CULTURE: Organism ID, Bacteria: NO GROWTH

## 2020-06-20 ENCOUNTER — Other Ambulatory Visit: Payer: Self-pay

## 2020-06-20 ENCOUNTER — Ambulatory Visit: Payer: Self-pay | Admitting: Registered Nurse

## 2020-06-20 ENCOUNTER — Encounter: Payer: Self-pay | Admitting: Registered Nurse

## 2020-06-20 VITALS — BP 114/84 | HR 71 | Resp 16

## 2020-06-20 DIAGNOSIS — T50Z95A Adverse effect of other vaccines and biological substances, initial encounter: Secondary | ICD-10-CM

## 2020-06-20 DIAGNOSIS — M7989 Other specified soft tissue disorders: Secondary | ICD-10-CM

## 2020-06-20 MED ORDER — ACETAMINOPHEN 500 MG PO TABS: 1000.0000 mg | ORAL_TABLET | Freq: Four times a day (QID) | ORAL | 0 refills | Status: AC | PRN

## 2020-06-20 MED ORDER — CETIRIZINE HCL 10 MG PO TABS: 10.0000 mg | ORAL_TABLET | Freq: Two times a day (BID) | ORAL | 11 refills | Status: DC

## 2020-06-20 MED ORDER — PREDNISONE 10 MG PO TABS: 30.0000 mg | ORAL_TABLET | Freq: Every day | ORAL | 0 refills | Status: AC

## 2020-06-20 MED ORDER — DIPHENHYDRAMINE HCL 2 % EX GEL: 1.0000 "application " | Freq: Four times a day (QID) | CUTANEOUS | Status: AC | PRN

## 2020-06-20 NOTE — Progress Notes (Signed)
Subjective:    Patient ID: Amy Holland, female    DOB: [birth year], 60 y.o.   MRN: [id number]  59y/o Caucasian established female pt c/o rash to R upper arm after receiving Covid booster Tues 1/11. Moderna. First 2 doses were Coca[employer]a.   Tried tylenol 1000mg  po, ice and it helped a little with discomfort but rash didn't improve.  Takes zyrtec daily for allergies.  Denied other trauma/insect bites.  Area tender, swollen, hot and new red rash spreading around bruised area where injection administered.  Denied tongue swelling, dyspnea, wheezing, n/v/d, shortness of breath, chest pain, lymph node swelling in neck or armpit or pain in these areas, dysphagia or dysphasia.     Review of Systems  Constitutional: Negative for activity change, appetite change, chills, diaphoresis, fatigue and fever.  HENT: Negative for congestion, facial swelling, sneezing, sore throat, trouble swallowing and voice change.   Eyes: Negative for photophobia, discharge, itching and visual disturbance.  Respiratory: Negative for cough, choking, chest tightness, shortness of breath, wheezing and stridor.   Cardiovascular: Negative for chest pain.  Gastrointestinal: Negative for abdominal pain, diarrhea, nausea and vomiting.  Endocrine: Negative for cold intolerance and heat intolerance.  Genitourinary: Negative for difficulty urinating.  Musculoskeletal: Positive for myalgias. Negative for back pain, gait problem, joint swelling, neck pain and neck stiffness.  Skin: Positive for color change and rash. Negative for pallor and wound.  Allergic/Immunologic: Positive for environmental allergies. Negative for food allergies.  Neurological: Negative for dizziness, tremors, seizures, syncope, facial asymmetry, speech difficulty, weakness, light-headedness and headaches.  Hematological: Negative for adenopathy. Does not bruise/bleed easily.  Psychiatric/Behavioral: Negative for agitation, confusion and sleep disturbance.        Objective:   Physical Exam Vitals and nursing note reviewed.  Constitutional:      General: She is awake. She is not in acute distress.Vital signs are normal.     Appearance: Normal appearance. She is well-developed, well-groomed, normal weight and well-nourished. She is not ill-appearing, toxic-appearing or diaphoretic.  HENT:     Head: Normocephalic and atraumatic.     Jaw: There is normal jaw occlusion.     Salivary Glands: Right salivary gland is not diffusely enlarged. Left salivary gland is not diffusely enlarged.     Right Ear: Hearing and external ear normal.     Left Ear: Hearing and external ear normal.     Nose: Nose normal. No congestion or rhinorrhea.     Mouth/Throat:     Lips: Pink. No lesions.     Mouth: Mucous membranes are moist. No angioedema.     Pharynx: Oropharynx is clear.  Eyes:     General: Lids are normal. Vision grossly intact. Gaze aligned appropriately. Allergic shiner present. No visual field deficit or scleral icterus.       Right eye: No discharge.        Left eye: No discharge.     Extraocular Movements: Extraocular movements intact and EOM normal.     Conjunctiva/sclera: Conjunctivae normal.     Pupils: Pupils are equal, round, and reactive to light.  Neck:     Thyroid: No thyroid mass, thyromegaly or thyroid tenderness.     Trachea: Trachea and phonation normal. No tracheal tenderness or tracheal deviation.  Cardiovascular:     Rate and Rhythm: Normal rate and regular rhythm.     Pulses: Normal pulses and intact distal pulses.          Radial pulses are 2+ on the right side and 2+  on the left side.  Pulmonary:     Effort: Pulmonary effort is normal. No respiratory distress.     Breath sounds: Normal breath sounds and air entry. No stridor, decreased air movement or transmitted upper airway sounds. No decreased breath sounds, wheezing, rhonchi or rales.     Comments: Spoke full sentences without difficulty; no cough/throat clearing/nasal sniffing  in clinic; wearing mask due to covid 19 pandemic Chest:  Breasts:     Right: No axillary adenopathy or supraclavicular adenopathy.     Left: No axillary adenopathy or supraclavicular adenopathy.    Abdominal:     General: Abdomen is flat.     Palpations: Abdomen is soft.  Musculoskeletal:        General: Swelling and tenderness present. No deformity or signs of injury. Normal range of motion.     Right shoulder: No swelling, deformity, effusion, laceration, tenderness, bony tenderness or crepitus. Normal range of motion. Normal strength. Normal pulse.     Left shoulder: Normal.     Right upper arm: Swelling, edema and tenderness present. No deformity, lacerations or bony tenderness.     Left upper arm: Normal.     Right elbow: Normal. No swelling, deformity, effusion or lacerations. Normal range of motion. No tenderness.     Left elbow: Normal.     Right forearm: Normal. No swelling, edema, deformity, lacerations, tenderness or bony tenderness.     Left forearm: Normal.     Right hand: Normal. No swelling, deformity, lacerations, tenderness or bony tenderness. Normal range of motion. Normal strength. Normal capillary refill.       Arms:     Cervical back: Normal range of motion and neck supple. No edema, erythema, signs of trauma, rigidity, torticollis, tenderness or crepitus. No pain with movement, spinous process tenderness or muscular tenderness. Normal range of motion.     Right lower leg: No edema.     Left lower leg: No edema.     Comments: Dry to touch, increased temperature, TTP, 0-1+/4 nonpitting edema affected area  Lymphadenopathy:     Head:     Right side of head: No submental, submandibular, tonsillar, preauricular, posterior auricular or occipital adenopathy.     Left side of head: No submental, submandibular, tonsillar, preauricular, posterior auricular or occipital adenopathy.     Cervical: No cervical adenopathy.     Right cervical: No superficial, deep or posterior  cervical adenopathy.    Left cervical: No superficial, deep or posterior cervical adenopathy.     Upper Body:     Right upper body: No supraclavicular, axillary or pectoral adenopathy.     Left upper body: No supraclavicular or axillary adenopathy.  Skin:    General: Skin is warm, dry and intact.     Capillary Refill: Capillary refill takes less than 2 seconds.     Coloration: Skin is not ashen, cyanotic, jaundiced, mottled, pale or sallow.     Findings: Bruising, ecchymosis, erythema and rash present. No abrasion, abscess, acne, burn, signs of injury, laceration, lesion, petechiae or wound. Rash is macular. Rash is not crusting, nodular, papular, purpuric, pustular, scaling, urticarial or vesicular.     Nails: There is no clubbing.          Comments: Nummular 1cm diameter ecchymosis TTP right deltoid surrounded by macular erythema 4x7cm deltoid to bicep muscle; does not extend to elbow or superior shoulder/deltoid/trap or axilla; increased temperature noted dry skin  Neurological:     General: No focal deficit present.  Mental Status: She is alert and oriented to person, place, and time. Mental status is at baseline.     GCS: GCS eye subscore is 4. GCS verbal subscore is 5. GCS motor subscore is 6.     Cranial Nerves: Cranial nerves are intact. No cranial nerve deficit, dysarthria or facial asymmetry.     Sensory: Sensation is intact. No sensory deficit.     Motor: Motor function is intact. No weakness, tremor, atrophy, abnormal muscle tone or seizure activity.     Coordination: Coordination is intact. Coordination normal.     Gait: Gait is intact. Gait normal.     Comments: Gait sure and steady in clinic; bilateral hand grasp equal 5/5  Psychiatric:        Attention and Perception: Attention and perception normal.        Mood and Affect: Mood and affect, mood and affect normal.        Speech: Speech normal.        Behavior: Behavior normal. Behavior is cooperative.        Thought  Content: Thought content normal.        Cognition and Memory: Cognition, memory and cognition and memory normal.        Judgment: Judgment normal.           Assessment & Plan:  A-adverse effect vaccine and swelling of upper arm right initial visit  P-will submit VAERS report.  Applied calagel in clinic after cryotherapy 15 minutes and patient reported some relief of discomfort.  Discussed increase zyrtec to 10mg  po BID the next couple of days and continue until rash resolved.  May apply calagel QID prn topical given 5 UD from clinic stock.  Cryotherapy 15 minutes QID prn pain/swelling.  If rash worsening and extending to elbow take prednisone 10mg  3 tablets po with food #21 RF0 dispensed from [facility] to patient and notify clinic staff via email [email or web address] or call RN Hildred Alamin [phone] if still at work.  Discussed trying to avoid oral steroids as can inhibit vaccine effect.  May take tylenol 1000mg  po q6h prn pain/swelling.  Discussed antihistamines do not adversely affect vaccine effect.  Discussed per up to date this local reaction occurs in up to 25% of individuals receiving vaccine.  Exitcare handouts printed and given on drug rash.  Call 911/go to ER if blue fingers/cold fingers (circulation decreased due to swelling), difficulty breathing/chest pain/shortness of breath for re-evaluation. Follow up with RN Hildred Alamin tomorrow re-evaluation.   Patient verbalized understanding information/instructions, agreed with plan of care and had no further questions at this time.  Avoid rubbing area.

## 2020-06-20 NOTE — Patient Instructions (Addendum)
Take zyrtec 10mg  by mouth every 12 hours until rash resolved then resume once a day zyrtec dose May apply calagel every 6 hours prn or hydrocortisone 1% every 12 hours Tylenol 1000mg  by mouth every 6 hours as needed for pain If tongue swelling, trouble breathing, chest pain seek re-evaluation immediately 911/ER consider taking dose of benadryl 25-50mg  as soon as possible ER/UC if rash extending down arm and fingers turning blue/cool to touch Ice/elevate arm for swelling/pain 15 minutes 4 times per day  Drug Rash A drug rash occurs when a medicine causes a change in the color or texture of the skin. It can develop minutes, hours, or days after you take the medicine. The rash may appear on a small area of skin or all over your body. What are the causes? This condition is usually caused by your body's reaction (allergy) to a medicine. It can also be caused by exposure to sunlight after taking a medicine that makes your skin sensitive to light. Though any medicine can cause a rash or reaction, medicines that are more likely to cause rashes include:  Penicillin.  Antibiotic medicines.  Medicines that treat seizures.  Medicines that treat cancer (chemotherapy).  Aspirin and other NSAIDs.  Injectable dyes that contain iodine.  Insulin. What are the signs or symptoms? Symptoms of this condition include:  Redness.  Tiny bumps.  Peeling.  Itching.  Itchy welts (hives).  Swelling.   How is this diagnosed? This condition may be diagnosed based on:  A physical exam.  Tests to find out which medicine caused the rash. These tests may include: ? Skin tests. ? Blood tests. ? Challenge test. For this test, you stop taking all the medicines that you do not need to take. Then, you start taking them again by adding back one medicine at a time. How is this treated? This condition is treated with medicines, including:  Antihistamine. This may be given to relieve itching.  NSAIDs. These  may be given to reduce swelling and to treat pain.  A steroid medicine. This may be given to reduce swelling. The rash usually goes away when you stop taking the medicine that caused it. Follow these instructions at home:  Take over-the-counter and prescription medicines only as told by your health care provider.  Tell all your health care providers about any medicine reactions that you have had in the past.  If your rash was caused by sensitivity to sunlight, and while your rash is healing: ? Avoid being in the sun if possible, especially when it is strongest, usually between 10 a.m. and 4 p.m. ? Cover your skin with pants, long sleeves, and a hat when you are exposed to sunlight.  If you have hives: ? Take a cool shower or use a cool compress to relieve itchiness. ? Take over-the-counter antihistamines, as recommended by your health care provider, until the hives are gone. Hives are not contagious.  Keep all follow-up visits as told by your health care provider. This is important. Contact a health care provider if you have:  A fever.  A rash that is not going away.  A rash that gets worse.  A rash that comes back.  Wheezing or coughing. Get help right away if:  You start to have breathing problems.  You start to have shortness of breath.  Your face or throat starts to swell.  You have severe weakness with dizziness or fainting.  You have chest pain. These symptoms may represent a serious problem that  is an emergency. Do not wait to see if the symptoms will go away. Get medical help right away. Call your local emergency services (911 in the [place] Do not drive yourself to the hospital. Summary  A drug rash occurs when a medicine causes a change in the color or texture of the skin. The rash may appear on a small area of skin or all over your body.  It can develop minutes, hours, or days after you take the medicine.  Your health care provider will do various tests to  determine what medicine caused your rash.  The rash may be treated with medicine to relieve itching, swelling, and pain. This information is not intended to replace advice given to you by your health care provider. Make sure you discuss any questions you have with your health care provider. Document Revised: 09/06/2019 Document Reviewed: 09/06/2019 Elsevier Patient Education  2021 [employer].

## 2020-06-21 ENCOUNTER — Encounter: Payer: Self-pay | Admitting: *Deleted

## 2020-06-21 ENCOUNTER — Telehealth: Payer: Self-pay | Admitting: *Deleted

## 2020-06-21 DIAGNOSIS — R21 Rash and other nonspecific skin eruption: Secondary | ICD-10-CM

## 2020-06-21 NOTE — Telephone Encounter (Signed)
Pt in to clinic this morning for rash f/u and recheck. Rash appears improved as it is less firm and less warm to the touch however size of rash has worsened some. Lateral edges of rash still over top to mid inner bicep, but lower border has extended down approx one inch and the 2 separate areas of erythema and firmness have become met and become one large area. Distal border still approx one inch above AC. No streaking noted. Pt reports rash is less tender today. Pt continuing Zyrtec and Calagel and with the partial improvement, would like to give one more day to continue before starting steroids. RN agreeable with this and pt verbalizes if rash extends any more down inner bicep or down to [facility], she will start Prednisone. Denies any other questions or concerns at this time.

## 2020-06-21 NOTE — Telephone Encounter (Signed)
Noted tenderness/heat improved but worsened in size patient wanted to continue zyrtec/calagel for one more day.  Will follow up with patient via telephone tomorrow.  Reviewed RN Hildred Alamin note.  Affected area has not extended to elbow.

## 2020-06-22 NOTE — Telephone Encounter (Signed)
Patient contacted via telephone and she reported she has been applying calagel QID and taking zyrtec 10mg  po BID and woke up this am with rash on arm much improved and almost completely resolved.  She did not take prednisone and had no further questions or concerns at this time.  Full vaccine information for covid obtained from patient and Epic updated #1 09/03/2019 pfizer lot [id number]  #2 pfizer 09/24/2019 [id number] and #3 Moderna [id number] 06/18/2020 Discussed with patient I would submit VAERS report for her adverse effect.  Patient verbalized understanding information/instructions, agreed with plan of care and had no further questions at this time.

## 2020-06-25 NOTE — Telephone Encounter (Signed)
Patient contacted via telephone as not at work.  Roads too icy for her to drive in today.  Reported rash completely resolved along with pain/swelling.  But she has developed loose stools now.  Reviewed up to date up to 21% patients reported GI upset after vaccination.  Discussed with patient if other new symptoms URI/fever/chills she is to notify staff as will schedule for covid testing due to widespread community spread of omicron variant.  Patient verbalized understanding information/instructions, agreed with plan of care and had no further questions at this time.  Spoke full sentences without difficulty, respiration even and unlabored no cough/throat clearing or nasal sniffing.

## 2020-06-27 NOTE — Telephone Encounter (Signed)
Patient seen in workcenter today rash/pain/swelling arm completely resolved and no questions or concerns.  [employer] report submission still pending.

## 2020-08-01 ENCOUNTER — Ambulatory Visit: Payer: Self-pay | Admitting: *Deleted

## 2020-08-01 ENCOUNTER — Other Ambulatory Visit: Payer: Self-pay

## 2020-08-01 VITALS — BP 148/78 | HR 70 | Ht 63.5 in | Wt 147.0 lb

## 2020-08-01 DIAGNOSIS — Z0000 Encounter for general adult medical examination without abnormal findings: Secondary | ICD-10-CM

## 2020-08-01 NOTE — Progress Notes (Signed)
[employer] insurance premium discount evaluation: Labs Drawn. Replacements ROI form signed. Tobacco Free Attestation form signed.  Forms placed in paper chart.  

## 2020-08-02 ENCOUNTER — Encounter: Payer: Self-pay | Admitting: Registered Nurse

## 2020-08-02 ENCOUNTER — Telehealth: Payer: Self-pay | Admitting: Registered Nurse

## 2020-08-02 DIAGNOSIS — E785 Hyperlipidemia, unspecified: Secondary | ICD-10-CM

## 2020-08-02 DIAGNOSIS — R7303 Prediabetes: Secondary | ICD-10-CM

## 2020-08-02 LAB — CMP12+LP+TP+TSH+6AC+CBC/D/PLT
ALT: 22 IU/L (ref 0–32)
AST: 29 IU/L (ref 0–40)
Albumin/Globulin Ratio: 1.7 (ref 1.2–2.2)
Albumin: 4.5 g/dL (ref 3.8–4.8)
Alkaline Phosphatase: 95 IU/L (ref 44–121)
BUN/Creatinine Ratio: 18 (ref 12–28)
BUN: 16 mg/dL (ref 8–27)
Basophils Absolute: 0.1 10*3/uL (ref 0.0–0.2)
Basos: 1 %
Bilirubin Total: 0.5 mg/dL (ref 0.0–1.2)
Calcium: 9.8 mg/dL (ref 8.7–10.3)
Chloride: 98 mmol/L (ref 96–106)
Chol/HDL Ratio: 2.3 ratio (ref 0.0–4.4)
Cholesterol, Total: 210 mg/dL — ABNORMAL HIGH (ref 100–199)
Creatinine, Ser: 0.88 mg/dL (ref 0.57–1.00)
EOS (ABSOLUTE): 0.1 10*3/uL (ref 0.0–0.4)
Eos: 2 %
Estimated CHD Risk: <0.5 times avg. (ref 0.0–1.0)
Free Thyroxine Index: 1.5 (ref 1.2–4.9)
GFR calc Af Amer: 82 mL/min/{1.73_m2} (ref >59)
GFR calc non Af Amer: 71 mL/min/{1.73_m2} (ref >59)
GGT: 21 IU/L (ref 0–60)
Globulin, Total: 2.6 g/dL (ref 1.5–4.5)
Glucose: 94 mg/dL (ref 65–99)
HDL: 90 mg/dL (ref >39)
Hematocrit: 42.3 % (ref 34.0–46.6)
Hemoglobin: 13.9 g/dL (ref 11.1–15.9)
Immature Grans (Abs): 0 10*3/uL (ref 0.0–0.1)
Immature Granulocytes: 0 %
Iron: 67 ug/dL (ref 27–139)
LDH: 186 IU/L (ref 119–226)
LDL Chol Calc (NIH): 111 mg/dL — ABNORMAL HIGH (ref 0–99)
Lymphocytes Absolute: 0.9 10*3/uL (ref 0.7–3.1)
Lymphs: 22 %
MCH: 30.2 pg (ref 26.6–33.0)
MCHC: 32.9 g/dL (ref 31.5–35.7)
MCV: 92 fL (ref 79–97)
Monocytes Absolute: 0.4 10*3/uL (ref 0.1–0.9)
Monocytes: 9 %
Neutrophils Absolute: 2.8 10*3/uL (ref 1.4–7.0)
Neutrophils: 66 %
Phosphorus: 2.9 mg/dL — ABNORMAL LOW (ref 3.0–4.3)
Platelets: 231 10*3/uL (ref 150–450)
Potassium: 4.6 mmol/L (ref 3.5–5.2)
RBC: 4.61 x10E6/uL (ref 3.77–5.28)
RDW: 12.3 % (ref 11.7–15.4)
Sodium: 142 mmol/L (ref 134–144)
T3 Uptake Ratio: 27 % (ref 24–39)
T4, Total: 5.7 ug/dL (ref 4.5–12.0)
TSH: 1.34 u[IU]/mL (ref 0.450–4.500)
Total Protein: 7.1 g/dL (ref 6.0–8.5)
Triglycerides: 52 mg/dL (ref 0–149)
Uric Acid: 5.1 mg/dL (ref 3.0–7.2)
VLDL Cholesterol Cal: 9 mg/dL (ref 5–40)
WBC: 4.3 10*3/uL (ref 3.4–10.8)

## 2020-08-02 LAB — HGB A1C W/O EAG: Hgb A1c MFr Bld: 5.7 % — ABNORMAL HIGH (ref 4.8–5.6)

## 2020-08-02 NOTE — Patient Instructions (Signed)
Diabetes Care, 44(Suppl 1), D40-C14. https://doi.org/https://doi.org/10.2337/dc21-S003">  Prediabetes Prediabetes is when your blood sugar (blood glucose) level is higher than normal but not high enough for you to be diagnosed with type 2 diabetes. Having prediabetes puts you at risk for developing type 2 diabetes (type 2 diabetes mellitus). With certain lifestyle changes, you may be able to prevent or delay the onset of type 2 diabetes. This is important because type 2 diabetes can lead to serious complications, such as:  Heart disease.  Stroke.  Blindness.  Kidney disease.  Depression.  Poor circulation in the feet and legs. In severe cases, this could lead to surgical removal of a leg (amputation). What are the causes? The exact cause of prediabetes is not known. It may result from insulin resistance. Insulin resistance develops when cells in the body do not respond properly to insulin that the body makes. This can cause excess glucose to build up in the blood. High blood glucose (hyperglycemia) can develop. What increases the risk? The following factors may make you more likely to develop this condition:  You have a family member with type 2 diabetes.  You are older than 45 years.  You had a temporary form of diabetes during a pregnancy (gestational diabetes).  You had polycystic ovary syndrome (PCOS).  You are overweight or obese.  You are inactive (sedentary).  You have a history of heart disease, including problems with cholesterol levels, high levels of blood fats, or high blood pressure. What are the signs or symptoms? You may have no symptoms. If you do have symptoms, they may include:  Increased hunger.  Increased thirst.  Increased urination.  Vision changes, such as blurry vision.  Tiredness (fatigue). How is this diagnosed? This condition can be diagnosed with blood tests. Your blood glucose may be checked with one or more of the following tests:  A  fasting blood glucose (FBG) test. You will not be allowed to eat (you will fast) for at least 8 hours before a blood sample is taken.  An A1C blood test (hemoglobin A1C). This test provides information about blood glucose levels over the previous 2?3 months.  An oral glucose tolerance test (OGTT). This test measures your blood glucose at two points in time: ? After fasting. This is your baseline level. ? Two hours after you drink a beverage that contains glucose. You may be diagnosed with prediabetes if:  Your FBG is 100?125 mg/dL (5.6-6.9 mmol/L).  Your A1C level is 5.7?6.4% (39-46 mmol/mol).  Your OGTT result is 140?199 mg/dL (7.8-11 mmol/L). These blood tests may be repeated to confirm your diagnosis.   How is this treated? Treatment may include dietary and lifestyle changes to help lower your blood glucose and prevent type 2 diabetes from developing. In some cases, medicine may be prescribed to help lower the risk of type 2 diabetes. Follow these instructions at home: Nutrition  Follow a healthy meal plan. This includes eating lean proteins, whole grains, legumes, fresh fruits and vegetables, low-fat dairy products, and healthy fats.  Follow instructions from your health care provider about eating or drinking restrictions.  Meet with a dietitian to create a healthy eating plan that is right for you.   Lifestyle  Do moderate-intensity exercise for at least 30 minutes a day on 5 or more days each week, or as told by your health care provider. A mix of activities may be best, such as: ? Brisk walking, swimming, biking, and weight lifting.  Lose weight as told by your health  care provider. Losing 5-7% of your body weight can reverse insulin resistance.  Do not drink alcohol if: ? Your health care provider tells you not to drink. ? You are pregnant, may be pregnant, or are planning to become pregnant.  If you drink alcohol: ? Limit how much you use to:  0-1 drink a day for  women.  0-2 drinks a day for men. ? Be aware of how much alcohol is in your drink. In the [place] one drink equals one 12 oz bottle of beer (355 mL), one 5 oz glass of wine (148 mL), or one 1 oz glass of hard liquor (44 mL). General instructions  Take over-the-counter and prescription medicines only as told by your health care provider. You may be prescribed medicines that help lower the risk of type 2 diabetes.  Do not use any products that contain nicotine or tobacco, such as cigarettes, e-cigarettes, and chewing tobacco. If you need help quitting, ask your health care provider.  Keep all follow-up visits. This is important. Where to find more information  American Diabetes Association: www.diabetes.org  Academy of Nutrition and Dietetics: www.eatright.org  American Heart Association: www.heart.org Contact a health care provider if:  You have any of these symptoms: ? Increased hunger. ? Increased urination. ? Increased thirst. ? Fatigue. ? Vision changes, such as blurry vision. Get help right away if you:  Have shortness of breath.  Feel confused.  Vomit or feel like you may vomit. Summary  Prediabetes is when your blood sugar (blood glucose)level is higher than normal but not high enough for you to be diagnosed with type 2 diabetes.  Having prediabetes puts you at risk for developing type 2 diabetes (type 2 diabetes mellitus).  Make lifestyle changes such as eating a healthy diet and exercising regularly to help prevent diabetes. Lose weight as told by your health care provider. This information is not intended to replace advice given to you by your health care provider. Make sure you discuss any questions you have with your health care provider. Document Revised: 08/24/2019 Document Reviewed: 08/24/2019 Elsevier Patient Education  2021 [place].  Prediabetes Eating Plan Prediabetes is a condition that causes blood sugar (glucose) levels to be higher than normal.  This increases the risk for developing type 2 diabetes (type 2 diabetes mellitus). Working with a health care provider or nutrition specialist (dietitian) to make diet and lifestyle changes can help prevent the onset of diabetes. These changes may help you:  Control your blood glucose levels.  Improve your cholesterol levels.  Manage your blood pressure. What are tips for following this plan? Reading food labels  Read food labels to check the amount of fat, salt (sodium), and sugar in prepackaged foods. Avoid foods that have: ? Saturated fats. ? Trans fats. ? Added sugars.  Avoid foods that have more than 300 milligrams (mg) of sodium per serving. Limit your sodium intake to less than 2,300 mg each day. Shopping  Avoid buying pre-made and processed foods.  Avoid buying drinks with added sugar. Cooking  Cook with olive oil. Do not use butter, lard, or ghee.  Bake, broil, grill, steam, or boil foods. Avoid frying. Meal planning  Work with your dietitian to create an eating plan that is right for you. This may include tracking how many calories you take in each day. Use a food diary, notebook, or mobile application to track what you eat at each meal.  Consider following a Mediterranean diet. This includes: ? Eating several servings  of fresh fruits and vegetables each day. ? Eating fish at least twice a week. ? Eating one serving each day of whole grains, beans, nuts, and seeds. ? Using olive oil instead of other fats. ? Limiting alcohol. ? Limiting red meat. ? Using nonfat or low-fat dairy products.  Consider following a plant-based diet. This includes dietary choices that focus on eating mostly vegetables and fruit, grains, beans, nuts, and seeds.  If you have high blood pressure, you may need to limit your sodium intake or follow a diet such as the DASH (Dietary Approaches to Stop Hypertension) eating plan. The DASH diet aims to lower high blood pressure.   Lifestyle  Set  weight loss goals with help from your health care team. It is recommended that most people with prediabetes lose 7% of their body weight.  Exercise for at least 30 minutes 5 or more days a week.  Attend a support group or seek support from a mental health counselor.  Take over-the-counter and prescription medicines only as told by your health care provider. What foods are recommended? Fruits Berries. Bananas. Apples. Oranges. Grapes. Papaya. Mango. Pomegranate. Kiwi. Grapefruit. Cherries. Vegetables Lettuce. Spinach. Peas. Beets. Cauliflower. Cabbage. Broccoli. Carrots. Tomatoes. Squash. Eggplant. Herbs. Peppers. Onions. Cucumbers. Brussels sprouts. Grains Whole grains, such as whole-wheat or whole-grain breads, crackers, cereals, and pasta. Unsweetened oatmeal. Bulgur. Barley. Quinoa. Brown rice. Corn or whole-wheat flour tortillas or taco shells. Meats and other proteins Seafood. Poultry without skin. Lean cuts of pork and beef. Tofu. Eggs. Nuts. Beans. Dairy Low-fat or fat-free dairy products, such as yogurt, cottage cheese, and cheese. Beverages Water. Tea. Coffee. Sugar-free or diet soda. Seltzer water. Low-fat or nonfat milk. Milk alternatives, such as soy or almond milk. Fats and oils Olive oil. Canola oil. Sunflower oil. Grapeseed oil. Avocado. Walnuts. Sweets and desserts Sugar-free or low-fat pudding. Sugar-free or low-fat ice cream and other frozen treats. Seasonings and condiments Herbs. Sodium-free spices. Mustard. Relish. Low-salt, low-sugar ketchup. Low-salt, low-sugar barbecue sauce. Low-fat or fat-free mayonnaise. The items listed above may not be a complete list of recommended foods and beverages. Contact a dietitian for more information. What foods are not recommended? Fruits Fruits canned with syrup. Vegetables Canned vegetables. Frozen vegetables with butter or cream sauce. Grains Refined white flour and flour products, such as bread, pasta, snack foods, and  cereals. Meats and other proteins Fatty cuts of meat. Poultry with skin. Breaded or fried meat. Processed meats. Dairy Full-fat yogurt, cheese, or milk. Beverages Sweetened drinks, such as iced tea and soda. Fats and oils Butter. Lard. Ghee. Sweets and desserts Baked goods, such as cake, cupcakes, pastries, cookies, and cheesecake. Seasonings and condiments Spice mixes with added salt. Ketchup. Barbecue sauce. Mayonnaise. The items listed above may not be a complete list of foods and beverages that are not recommended. Contact a dietitian for more information. Where to find more information  American Diabetes Association: www.diabetes.org Summary  You may need to make diet and lifestyle changes to help prevent the onset of diabetes. These changes can help you control blood sugar, improve cholesterol levels, and manage blood pressure.  Set weight loss goals with help from your health care team. It is recommended that most people with prediabetes lose 7% of their body weight.  Consider following a Mediterranean diet. This includes eating plenty of fresh fruits and vegetables, whole grains, beans, nuts, seeds, fish, and low-fat dairy, and using olive oil instead of other fats. This information is not intended to replace advice given to  you by your health care provider. Make sure you discuss any questions you have with your health care provider. Document Revised: 08/24/2019 Document Reviewed: 08/24/2019 Elsevier Patient Education  2021 [place].  High Cholesterol  High cholesterol is a condition in which the blood has high levels of a white, waxy substance similar to fat (cholesterol). The liver makes all the cholesterol that the body needs. The human body needs small amounts of cholesterol to help build cells. A person gets extra or excess cholesterol from the food that he or she eats. The blood carries cholesterol from the liver to the rest of the body. If you have high cholesterol,  deposits (plaques) may build up on the walls of your arteries. Arteries are the blood vessels that carry blood away from your heart. These plaques make the arteries narrow and stiff. Cholesterol plaques increase your risk for heart attack and stroke. Work with your health care provider to keep your cholesterol levels in a healthy range. What increases the risk? The following factors may make you more likely to develop this condition:  Eating foods that are high in animal fat (saturated fat) or cholesterol.  Being overweight.  Not getting enough exercise.  A family history of high cholesterol (familial hypercholesterolemia).  Use of tobacco products.  Having diabetes. What are the signs or symptoms? There are no symptoms of this condition. How is this diagnosed? This condition may be diagnosed based on the results of a blood test.  If you are older than 62 years of age, your health care provider may check your cholesterol levels every 4-6 years.  You may be checked more often if you have high cholesterol or other risk factors for heart disease. The blood test for cholesterol measures:  "Bad" cholesterol, or LDL cholesterol. This is the main type of cholesterol that causes heart disease. The desired level is less than 100 mg/dL.  "Good" cholesterol, or HDL cholesterol. HDL helps protect against heart disease by cleaning the arteries and carrying the LDL to the liver for processing. The desired level for HDL is 60 mg/dL or higher.  Triglycerides. These are fats that your body can store or burn for energy. The desired level is less than 150 mg/dL.  Total cholesterol. This measures the total amount of cholesterol in your blood and includes LDL, HDL, and triglycerides. The desired level is less than 200 mg/dL. How is this treated? This condition may be treated with:  Diet changes. You may be asked to eat foods that have more fiber and less saturated fats or added sugar.  Lifestyle  changes. These may include regular exercise, maintaining a healthy weight, and quitting use of tobacco products.  Medicines. These are given when diet and lifestyle changes have not worked. You may be prescribed a statin medicine to help lower your cholesterol levels. Follow these instructions at home: Eating and drinking  Eat a healthy, balanced diet. This diet includes: ? Daily servings of a variety of fresh, frozen, or canned fruits and vegetables. ? Daily servings of whole grain foods that are rich in fiber. ? Foods that are low in saturated fats and trans fats. These include poultry and fish without skin, lean cuts of meat, and low-fat dairy products. ? A variety of fish, especially oily fish that contain omega-3 fatty acids. Aim to eat fish at least 2 times a week.  Avoid foods and drinks that have added sugar.  Use healthy cooking methods, such as roasting, grilling, broiling, baking, poaching, steaming, and  stir-frying. Do not fry your food except for stir-frying.   Lifestyle  Get regular exercise. Aim to exercise for a total of 150 minutes a week. Increase your activity level by doing activities such as gardening, walking, and taking the stairs.  Do not use any products that contain nicotine or tobacco, such as cigarettes, e-cigarettes, and chewing tobacco. If you need help quitting, ask your health care provider.   General instructions  Take over-the-counter and prescription medicines only as told by your health care provider.  Keep all follow-up visits as told by your health care provider. This is important. Where to find more information  American Heart Association: www.heart.org  National Heart, Lung, and Blood Institute: https://wilson-e[place].com/ Contact a health care provider if:  You have trouble achieving or maintaining a healthy diet or weight.  You are starting an exercise program.  You are unable to stop smoking. Get help right away if:  You have chest pain.  You  have trouble breathing.  You have any symptoms of a stroke. "BE FAST" is an easy way to remember the main warning signs of a stroke: ? B - Balance. Signs are dizziness, sudden trouble walking, or loss of balance. ? E - Eyes. Signs are trouble seeing or a sudden change in vision. ? F - Face. Signs are sudden weakness or numbness of the face, or the face or eyelid drooping on one side. ? A - Arms. Signs are weakness or numbness in an arm. This happens suddenly and usually on one side of the body. ? S - Speech. Signs are sudden trouble speaking, slurred speech, or trouble understanding what people say. ? T - Time. Time to call emergency services. Write down what time symptoms started.  You have other signs of a stroke, such as: ? A sudden, severe headache with no known cause. ? Nausea or vomiting. ? Seizure. These symptoms may represent a serious problem that is an emergency. Do not wait to see if the symptoms will go away. Get medical help right away. Call your local emergency services (911 in the [place] Do not drive yourself to the hospital. Summary  Cholesterol plaques increase your risk for heart attack and stroke. Work with your health care provider to keep your cholesterol levels in a healthy range.  Eat a healthy, balanced diet, get regular exercise, and maintain a healthy weight.  Do not use any products that contain nicotine or tobacco, such as cigarettes, e-cigarettes, and chewing tobacco.  Get help right away if you have any symptoms of a stroke. This information is not intended to replace advice given to you by your health care provider. Make sure you discuss any questions you have with your health care provider. Document Revised: 04/24/2019 Document Reviewed: 04/24/2019 Elsevier Patient Education  2021 [place].  High-Fiber Eating Plan Fiber, also called dietary fiber, is a type of carbohydrate. It is found foods such as fruits, vegetables, whole grains, and beans. A  high-fiber diet can have many health benefits. Your health care provider may recommend a high-fiber diet to help:  Prevent constipation. Fiber can make your bowel movements more regular.  Lower your cholesterol.  Relieve the following conditions: ? Inflammation of veins in the anus (hemorrhoids). ? Inflammation of specific areas of the digestive tract (uncomplicated diverticulosis). ? A problem of the large intestine, also called the colon, that sometimes causes pain and diarrhea (irritable bowel syndrome, or IBS).  Prevent overeating as part of a weight-loss plan.  Prevent heart disease, type  2 diabetes, and certain cancers. What are tips for following this plan? Reading food labels  Check the nutrition facts label on food products for the amount of dietary fiber. Choose foods that have 5 grams of fiber or more per serving.  The goals for recommended daily fiber intake include: ? Men (age 57 or younger): 34-38 g. ? Men (over age 102): 28-34 g. ? Women (age 21 or younger): 25-28 g. ? Women (over age 76): 22-25 g. Your daily fiber goal is _____________ g.   Shopping  Choose whole fruits and vegetables instead of processed forms, such as apple juice or applesauce.  Choose a wide variety of high-fiber foods such as avocados, lentils, oats, and kidney beans.  Read the nutrition facts label of the foods you choose. Be aware of foods with added fiber. These foods often have high sugar and sodium amounts per serving. Cooking  Use whole-grain flour for baking and cooking.  Cook with brown rice instead of white rice. Meal planning  Start the day with a breakfast that is high in fiber, such as a cereal that contains 5 g of fiber or more per serving.  Eat breads and cereals that are made with whole-grain flour instead of refined flour or white flour.  Eat brown rice, bulgur wheat, or millet instead of white rice.  Use beans in place of meat in soups, salads, and pasta dishes.  Be  sure that half of the grains you eat each day are whole grains. General information  You can get the recommended daily intake of dietary fiber by: ? Eating a variety of fruits, vegetables, grains, nuts, and beans. ? Taking a fiber supplement if you are not able to take in enough fiber in your diet. It is better to get fiber through food than from a supplement.  Gradually increase how much fiber you consume. If you increase your intake of dietary fiber too quickly, you may have bloating, cramping, or gas.  Drink plenty of water to help you digest fiber.  Choose high-fiber snacks, such as berries, raw vegetables, nuts, and popcorn. What foods should I eat? Fruits Berries. Pears. Apples. Oranges. Avocado. Prunes and raisins. Dried figs. Vegetables Sweet potatoes. Spinach. Kale. Artichokes. Cabbage. Broccoli. Cauliflower. Green peas. Carrots. Squash. Grains Whole-grain breads. Multigrain cereal. Oats and oatmeal. Brown rice. Barley. Bulgur wheat. [place]. Quinoa. Bran muffins. Popcorn. Rye wafer crackers. Meats and other proteins Navy beans, kidney beans, and pinto beans. Soybeans. Split peas. Lentils. Nuts and seeds. Dairy Fiber-fortified yogurt. Beverages Fiber-fortified soy milk. Fiber-fortified orange juice. Other foods Fiber bars. The items listed above may not be a complete list of recommended foods and beverages. Contact a dietitian for more information. What foods should I avoid? Fruits Fruit juice. Cooked, strained fruit. Vegetables Fried potatoes. Canned vegetables. Well-cooked vegetables. Grains White bread. Pasta made with refined flour. White rice. Meats and other proteins Fatty cuts of meat. Fried chicken or fried fish. Dairy Milk. Yogurt. Cream cheese. Sour cream. Fats and oils Butters. Beverages Soft drinks. Other foods Cakes and pastries. The items listed above may not be a complete list of foods and beverages to avoid. Talk with your dietitian about what  choices are best for you. Summary  Fiber is a type of carbohydrate. It is found in foods such as fruits, vegetables, whole grains, and beans.  A high-fiber diet has many benefits. It can help to prevent constipation, lower blood cholesterol, aid weight loss, and reduce your risk of heart disease, diabetes, and certain  cancers.  Increase your intake of fiber gradually. Increasing fiber too quickly may cause cramping, bloating, and gas. Drink plenty of water while you increase the amount of fiber you consume.  The best sources of fiber include whole fruits and vegetables, whole grains, nuts, seeds, and beans. This information is not intended to replace advice given to you by your health care provider. Make sure you discuss any questions you have with your health care provider. Document Revised: 09/28/2019 Document Reviewed: 09/28/2019 Elsevier Patient Education  2021 [employer].

## 2020-08-19 ENCOUNTER — Other Ambulatory Visit: Payer: Self-pay | Admitting: Physician Assistant

## 2020-08-19 ENCOUNTER — Other Ambulatory Visit: Payer: Self-pay

## 2020-08-19 ENCOUNTER — Ambulatory Visit
Admission: RE | Admit: 2020-08-19 | Discharge: 2020-08-19 | Disposition: A | Discharge status: Home / Self Care | Payer: PRIVATE HEALTH INSURANCE | Source: Ambulatory Visit | Attending: Physician Assistant | Admitting: Physician Assistant

## 2020-08-19 DIAGNOSIS — R600 Localized edema: Secondary | ICD-10-CM

## 2020-09-02 ENCOUNTER — Other Ambulatory Visit: Payer: Self-pay | Admitting: *Deleted

## 2020-09-02 MED ORDER — CETIRIZINE HCL 10 MG PO TABS: 10.0000 mg | ORAL_TABLET | Freq: Every day | ORAL | 3 refills | Status: AC

## 2020-10-03 NOTE — Telephone Encounter (Signed)
Attempted to contact patient not at desk today to verify if any questions or concerns

## 2020-10-15 NOTE — Telephone Encounter (Signed)
Spoke with patient in workcenter last week.  Stated she had follow up with PCM.  No questions or concerns to discuss with me.

## 2021-01-10 ENCOUNTER — Other Ambulatory Visit: Payer: Self-pay

## 2021-01-10 ENCOUNTER — Ambulatory Visit: Payer: Self-pay | Admitting: *Deleted

## 2021-01-10 VITALS — BP 110/78

## 2021-01-10 DIAGNOSIS — R030 Elevated blood-pressure reading, without diagnosis of hypertension: Secondary | ICD-10-CM

## 2021-01-10 NOTE — Progress Notes (Signed)
Pt started Losartan-HCTZ on Monday 8/1,. Requesting weekly BP checks for evaluating effectiveness. Denies any adverse reactions.   Automatic cuff read 89/68. Manual repeat was improved at 110/78. Pt reports some fatigue but unsure if related. If drowsiness or lightheadedness occur, notify prescriber and she can also try half dose of medication for a week then increase to full dose. Pt agreeable with this and will see how she feels this weekend.

## 2021-01-17 ENCOUNTER — Ambulatory Visit: Payer: Self-pay | Admitting: *Deleted

## 2021-01-17 ENCOUNTER — Other Ambulatory Visit: Payer: Self-pay

## 2021-01-17 VITALS — BP 108/80 | HR 84

## 2021-01-17 DIAGNOSIS — I10 Essential (primary) hypertension: Secondary | ICD-10-CM

## 2021-01-17 NOTE — Progress Notes (Signed)
BP check per pt request.

## 2021-01-19 NOTE — Telephone Encounter (Signed)
New research studies were released stating "covid arm" reactions known and no further VAERS submissions required if developed greater than 4 hours after vaccination  Patient notified.  [email or web address]  [email or web address]  [email or web address]

## 2021-01-24 ENCOUNTER — Ambulatory Visit: Payer: Self-pay | Admitting: *Deleted

## 2021-01-24 ENCOUNTER — Other Ambulatory Visit: Payer: Self-pay

## 2021-01-24 VITALS — BP 109/79 | HR 74

## 2021-01-24 DIAGNOSIS — I10 Essential (primary) hypertension: Secondary | ICD-10-CM

## 2021-01-24 NOTE — Progress Notes (Signed)
BP check per pt request.

## 2021-02-07 ENCOUNTER — Ambulatory Visit: Payer: Self-pay | Admitting: *Deleted

## 2021-02-07 ENCOUNTER — Other Ambulatory Visit: Payer: Self-pay

## 2021-02-07 VITALS — BP 108/77 | HR 77

## 2021-02-07 DIAGNOSIS — I10 Essential (primary) hypertension: Secondary | ICD-10-CM

## 2021-02-07 NOTE — Progress Notes (Signed)
Routine BP check per pt request.

## 2021-02-14 ENCOUNTER — Other Ambulatory Visit: Payer: Self-pay

## 2021-02-14 ENCOUNTER — Ambulatory Visit: Payer: Self-pay | Admitting: *Deleted

## 2021-02-14 VITALS — BP 102/68 | HR 90

## 2021-02-14 DIAGNOSIS — I10 Essential (primary) hypertension: Secondary | ICD-10-CM

## 2021-02-14 NOTE — Progress Notes (Signed)
Routine BP check per pt request.  Pt has been taking half dose of BP med on weekends as full dose makes her too sleepy but it doesn't bother her much during the workweek since she stays busy.

## 2021-02-27 ENCOUNTER — Telehealth: Payer: Self-pay | Admitting: Registered Nurse

## 2021-02-27 DIAGNOSIS — Z87898 Personal history of other specified conditions: Secondary | ICD-10-CM

## 2021-02-27 NOTE — Telephone Encounter (Signed)
Patient reported did labs at [facility] earlier this year noted CBC/CMET/lipids on file; stated cologuard completed this year but last mammogram 2021 May.  Has not completed this year as PCM changed offices to [facility] from [facility] and she hasn't been contacted to schedule yet.  History of breast cancer 5 years ago.  Discussed with patient recommended annual screenings for her age group per ACOG/ACR/ACS recommendations.  Patient stated she has access to my chart and will print her results to mail to insurance company.  Discussed with patient if unable to access her my chart contact me via email and I can send her email with results to print.  Patient verbalized understanding information/instructions, agreed with plan of care and had no further questions at this time.  CLINICAL DATA:  Bilateral lumpectomies.  Annual mammography.   EXAM: DIGITAL DIAGNOSTIC BILATERAL MAMMOGRAM WITH CAD AND TOMO   COMPARISON:  Previous exam(s).   ACR Breast Density Category c: The breast tissue is heterogeneously dense, which may obscure small masses.   FINDINGS: The right lumpectomy site is stable. The left lumpectomy site is stable. No suspicious masses, calcifications, or distortion are seen in either breast.   Mammographic images were processed with CAD.   IMPRESSION: No mammographic evidence of malignancy.   RECOMMENDATION: Annual diagnostic mammography.   I have discussed the findings and recommendations with the patient. If applicable, a reminder letter will be sent to the patient regarding the next appointment.   BI-RADS CATEGORY  2: Benign.     Electronically Signed   By: Dorise Bullion III M.D   On: 10/27/2019 11:37  Results for Amy Holland, Amy Holland (MRN [id number]) as of 02/27/2021 11:40  Ref. Range 08/01/2020 09:41  Sodium Latest Ref Range: 134 - 144 mmol/L 142  Potassium Latest Ref Range: 3.5 - 5.2 mmol/L 4.6  Chloride Latest Ref Range: 96 - 106 mmol/L 98   Glucose Latest Ref Range: 65 - 99 mg/dL 94  BUN Latest Ref Range: 8 - 27 mg/dL 16  Creatinine Latest Ref Range: 0.57 - 1.00 mg/dL 0.88  Calcium Latest Ref Range: 8.7 - 10.3 mg/dL 9.8  BUN/Creatinine Ratio Latest Ref Range: 12 - 28  18  Phosphorus Latest Ref Range: 3.0 - 4.3 mg/dL 2.9 (L)  Alkaline Phosphatase Latest Ref Range: 44 - 121 IU/L 95  Albumin Latest Ref Range: 3.8 - 4.8 g/dL 4.5  Albumin/Globulin Ratio Latest Ref Range: 1.2 - 2.2  1.7  Uric Acid Latest Ref Range: 3.0 - 7.2 mg/dL 5.1  AST Latest Ref Range: 0 - 40 IU/L 29  ALT Latest Ref Range: 0 - 32 IU/L 22  Total Protein Latest Ref Range: 6.0 - 8.5 g/dL 7.1  Total Bilirubin Latest Ref Range: 0.0 - 1.2 mg/dL 0.5  GGT Latest Ref Range: 0 - 60 IU/L 21  GFR, Est Non African American Latest Ref Range: >59 mL/min/1.73 71  GFR, Est African American Latest Ref Range: >59 mL/min/1.73 82  Estimated CHD Risk Latest Ref Range: 0.0 - 1.0 times avg. < 0.5  LDH Latest Ref Range: 119 - 226 IU/L 186  Total CHOL/HDL Ratio Latest Ref Range: 0.0 - 4.4 ratio 2.3  Cholesterol, Total Latest Ref Range: 100 - 199 mg/dL 210 (H)  HDL Cholesterol Latest Ref Range: >39 mg/dL 90  Triglycerides Latest Ref Range: 0 - 149 mg/dL 52  VLDL Cholesterol Cal Latest Ref Range: 5 - 40 mg/dL 9  LDL Chol Calc (NIH) Latest Ref Range: 0 - 99 mg/dL 111 (H)  Iron  Latest Ref Range: 27 - 139 ug/dL 67  Globulin, Total Latest Ref Range: 1.5 - 4.5 g/dL 2.6  WBC Latest Ref Range: 3.4 - 10.8 x10E3/uL 4.3  RBC Latest Ref Range: 3.77 - 5.28 x10E6/uL 4.61  Hemoglobin Latest Ref Range: 11.1 - 15.9 g/dL 13.9  HCT Latest Ref Range: 34.0 - 46.6 % 42.3  MCV Latest Ref Range: 79 - 97 fL 92  MCH Latest Ref Range: 26.6 - 33.0 pg 30.2  MCHC Latest Ref Range: 31.5 - 35.7 g/dL 32.9  RDW Latest Ref Range: 11.7 - 15.4 % 12.3  Platelets Latest Ref Range: 150 - 450 x10E3/uL 231  Neutrophils Latest Ref Range: Not Estab. % 66  Immature Granulocytes Latest Ref Range: Not Estab. % 0  NEUT#  Latest Ref Range: 1.4 - 7.0 x10E3/uL 2.8  Lymphocyte # Latest Ref Range: 0.7 - 3.1 x10E3/uL 0.9  Monocytes Absolute Latest Ref Range: 0.1 - 0.9 x10E3/uL 0.4  Basophils Absolute Latest Ref Range: 0.0 - 0.2 x10E3/uL 0.1  Immature Grans (Abs) Latest Ref Range: 0.0 - 0.1 x10E3/uL 0.0  Lymphs Latest Ref Range: Not Estab. % 22  Monocytes Latest Ref Range: Not Estab. % 9  Basos Latest Ref Range: Not Estab. % 1  Eos Latest Ref Range: Not Estab. % 2  EOS (ABSOLUTE) Latest Ref Range: 0.0 - 0.4 x10E3/uL 0.1  Hemoglobin A1C Latest Ref Range: 4.8 - 5.6 % 5.7 (H)  TSH Latest Ref Range: 0.450 - 4.500 uIU/mL 1.340  Thyroxine (T4) Latest Ref Range: 4.5 - 12.0 ug/dL 5.7  Free Thyroxine Index Latest Ref Range: 1.2 - 4.9  1.5  T3 Uptake Ratio Latest Ref Range: 24 - 39 % 27

## 2021-03-07 ENCOUNTER — Other Ambulatory Visit: Payer: Self-pay

## 2021-03-07 ENCOUNTER — Ambulatory Visit: Payer: Self-pay | Admitting: *Deleted

## 2021-03-07 VITALS — BP 120/80 | HR 80

## 2021-03-07 DIAGNOSIS — I10 Essential (primary) hypertension: Secondary | ICD-10-CM

## 2021-03-14 ENCOUNTER — Ambulatory Visit: Payer: Self-pay | Admitting: *Deleted

## 2021-03-14 ENCOUNTER — Other Ambulatory Visit: Payer: Self-pay

## 2021-03-14 VITALS — BP 126/86 | HR 70

## 2021-03-14 DIAGNOSIS — I10 Essential (primary) hypertension: Secondary | ICD-10-CM

## 2021-03-14 NOTE — Progress Notes (Signed)
Routine BP check per pt request. Now 9 weeks out from starting meds and will be sending mesg in MyChart to pcp notifying him that her average BPs are 110-120's/70-80's.

## 2021-07-08 ENCOUNTER — Other Ambulatory Visit: Payer: Self-pay

## 2021-07-08 ENCOUNTER — Encounter: Payer: Self-pay | Admitting: Registered Nurse

## 2021-07-08 ENCOUNTER — Ambulatory Visit: Payer: Self-pay | Admitting: Registered Nurse

## 2021-07-08 VITALS — BP 100/80 | HR 66 | Temp 98.5°F | Resp 16

## 2021-07-08 DIAGNOSIS — J029 Acute pharyngitis, unspecified: Secondary | ICD-10-CM

## 2021-07-08 MED ORDER — LIDOCAINE VISCOUS HCL 2 % MT SOLN: 5.0000 mL | Freq: Three times a day (TID) | OROMUCOSAL | 0 refills | Status: AC | PRN

## 2021-07-08 NOTE — Progress Notes (Signed)
Subjective:    Patient ID: Amy Holland, female    DOB: 04/30/1961, 61 y.o.   MRN: [id number]  60y/o caucasian female here for sore throat.  Did covid home test last night negative.  Denied URI symptoms e.g. runny nose/cough/congestion/fever/n/v/d.  Noted some spots back of throat and lymph nodes sore neck.     Review of Systems  Constitutional:  Negative for activity change, appetite change, chills, diaphoresis, fatigue and fever.  HENT:  Positive for mouth sores and sore throat. Negative for congestion, dental problem, drooling, ear discharge, ear pain, facial swelling, nosebleeds, postnasal drip, rhinorrhea, sinus pressure, sinus pain, sneezing, trouble swallowing and voice change.   Eyes:  Negative for photophobia and visual disturbance.  Respiratory:  Negative for cough, shortness of breath, wheezing and stridor.   Cardiovascular:  Negative for chest pain.  Gastrointestinal:  Negative for diarrhea, nausea and vomiting.  Endocrine: Negative for cold intolerance and heat intolerance.  Genitourinary:  Negative for difficulty urinating.  Musculoskeletal:  Negative for arthralgias, back pain, gait problem, joint swelling, myalgias, neck pain and neck stiffness.  Skin:  Negative for rash.  Allergic/Immunologic: Positive for environmental allergies. Negative for food allergies.  Neurological:  Negative for dizziness, tremors, seizures, syncope, facial asymmetry, speech difficulty, weakness, light-headedness, numbness and headaches.  Hematological:  Negative for adenopathy. Does not bruise/bleed easily.  Psychiatric/Behavioral:  Negative for agitation, confusion and sleep disturbance.       Objective:   Physical Exam Vitals and nursing note reviewed.  Constitutional:      General: She is awake. She is not in acute distress.    Appearance: Normal appearance. She is well-developed, well-groomed and normal weight. She is not ill-appearing, toxic-appearing or diaphoretic.  HENT:     Head:  Normocephalic and atraumatic.     Jaw: There is normal jaw occlusion. No trismus.     Salivary Glands: Right salivary gland is not diffusely enlarged or tender. Left salivary gland is not diffusely enlarged or tender.     Right Ear: Hearing, ear canal and external ear normal. No decreased hearing noted. No laceration, drainage, swelling or tenderness. A middle ear effusion is present. There is no impacted cerumen. No foreign body. No mastoid tenderness. No hemotympanum. Tympanic membrane is bulging. Tympanic membrane is not injected, scarred, perforated, erythematous or retracted.     Left Ear: Hearing, ear canal and external ear normal. No decreased hearing noted. No laceration, drainage, swelling or tenderness. A middle ear effusion is present. There is no impacted cerumen. No foreign body. No mastoid tenderness. No hemotympanum. Tympanic membrane is bulging. Tympanic membrane is not injected, scarred, perforated, erythematous or retracted.     Nose: Nose normal. No nasal deformity, septal deviation, laceration, mucosal edema, congestion or rhinorrhea.     Right Turbinates: Not enlarged, swollen or pale.     Left Turbinates: Not enlarged, swollen or pale.     Right Sinus: No maxillary sinus tenderness or frontal sinus tenderness.     Left Sinus: No maxillary sinus tenderness or frontal sinus tenderness.     Mouth/Throat:     Lips: Pink. No lesions.     Mouth: Mucous membranes are moist. Mucous membranes are not pale, not dry and not cyanotic. No lacerations, oral lesions or angioedema.     Dentition: No gingival swelling, dental abscesses or gum lesions.     Tongue: No lesions. Tongue does not deviate from midline.     Palate: Lesions present. No mass.     Pharynx: Uvula midline.  Pharyngeal swelling and posterior oropharyngeal erythema present. No oropharyngeal exudate or uvula swelling.     Tonsils: No tonsillar exudate or tonsillar abscesses. 0 on the right. 0 on the left.     Comments:  Oropharyngeal with macular erythema and shallow ulcers x 2 right; cobblestoning posterior pharynx; bilateral allergic shiners; bilateral TMs air fluid level clear no erythema/discharge in canal; TMs intact bilaterally bulging Eyes:     General: Lids are normal. Vision grossly intact. Gaze aligned appropriately. Allergic shiner present. No scleral icterus.       Right eye: No foreign body, discharge or hordeolum.        Left eye: No foreign body, discharge or hordeolum.     Extraocular Movements: Extraocular movements intact.     Right eye: Normal extraocular motion and no nystagmus.     Left eye: Normal extraocular motion and no nystagmus.     Conjunctiva/sclera: Conjunctivae normal.     Right eye: Right conjunctiva is not injected. No chemosis, exudate or hemorrhage.    Left eye: Left conjunctiva is not injected. No chemosis, exudate or hemorrhage.    Pupils: Pupils are equal, round, and reactive to light. Pupils are equal.     Right eye: Pupil is round and reactive.     Left eye: Pupil is round and reactive.  Neck:     Thyroid: No thyroid mass, thyromegaly or thyroid tenderness.     Trachea: Trachea and phonation normal. No tracheal tenderness or tracheal deviation.     Comments: Shotty lymph nodes left anterior cervical and slightly TTP Cardiovascular:     Rate and Rhythm: Normal rate and regular rhythm.     Pulses: Normal pulses.          Radial pulses are 2+ on the right side and 2+ on the left side.     Heart sounds: Normal heart sounds, S1 normal and S2 normal.  Pulmonary:     Effort: Pulmonary effort is normal. No accessory muscle usage or respiratory distress.     Breath sounds: Normal breath sounds and air entry. No stridor, decreased air movement or transmitted upper airway sounds. No decreased breath sounds, wheezing, rhonchi or rales.     Comments: Wearing cloth mask; spoke full sentences without difficulty; no cough/throat clearing observed in exam room Chest:     Chest  wall: No tenderness.  Abdominal:     General: There is no distension.     Palpations: Abdomen is soft.  Musculoskeletal:        General: No tenderness. Normal range of motion.     Right shoulder: Normal.     Left shoulder: Normal.     Right hand: Normal.     Left hand: Normal.     Cervical back: Normal range of motion and neck supple. No swelling, edema, deformity, erythema, signs of trauma, lacerations, rigidity, spasms, tenderness or crepitus. No pain with movement or muscular tenderness. Normal range of motion.     Right hip: Normal.     Left hip: Normal.     Right knee: Normal.     Left knee: Normal.  Lymphadenopathy:     Head:     Right side of head: No submental, submandibular, tonsillar, preauricular, posterior auricular or occipital adenopathy.     Left side of head: No submental, submandibular, tonsillar, preauricular, posterior auricular or occipital adenopathy.     Cervical: Cervical adenopathy present.     Right cervical: No superficial, deep or posterior cervical adenopathy.  Left cervical: Superficial cervical adenopathy present. No deep or posterior cervical adenopathy.  Skin:    General: Skin is warm and dry.     Capillary Refill: Capillary refill takes less than 2 seconds.     Coloration: Skin is not ashen, cyanotic, jaundiced, mottled, pale or sallow.     Findings: No abrasion, abscess, acne, bruising, burn, ecchymosis, erythema, signs of injury, laceration, lesion, petechiae, rash or wound.     Nails: There is no clubbing.  Neurological:     General: No focal deficit present.     Mental Status: She is alert and oriented to person, place, and time. Mental status is at baseline. She is not disoriented.     GCS: GCS eye subscore is 4. GCS verbal subscore is 5. GCS motor subscore is 6.     Cranial Nerves: Cranial nerves 2-12 are intact. No cranial nerve deficit, dysarthria or facial asymmetry.     Sensory: Sensation is intact. No sensory deficit.     Motor: Motor  function is intact. No weakness, tremor, atrophy, abnormal muscle tone or seizure activity.     Coordination: Coordination is intact. Coordination normal.     Gait: Gait is intact. Gait normal.     Comments: On/off exam table without difficulty; gait sure and steady clinic; bilateral hand grasp equal 5/5  Psychiatric:        Attention and Perception: Attention and perception normal.        Mood and Affect: Mood and affect normal.        Speech: Speech normal.        Behavior: Behavior normal. Behavior is cooperative.        Thought Content: Thought content normal.        Cognition and Memory: Cognition and memory normal.        Judgment: Judgment normal.          Assessment & Plan:   A-acute pharyngitis  P-suspect viral illness.  If new symptoms repeat covid test in 48 hours at home rapid.  Usually no specific medical treatment is needed if a virus is causing the sore throat. The throat most often gets better on its own within 5 to 7 days. Antibiotic medicine does not cure viral pharyngitis.  -Tylenol 1000mg  by mouth every 6 hours as needed for pain or fever OTC ER/call 911 if drooling, unable to swallow, trouble breathing discussed tonsillar abscess symptoms/hot potato voice. -Do not smoke.  -Avoid secondhand smoke and other air pollutants.  -Use a cool mist humidifier to add moisture to the air.  -Get plenty of rest; sleep 7-8 hours per night -You may want to rest your throat by talking less and eating a diet that is mostly liquid or soft for a day or two.  -Nonprescription throat lozenges and mouthwashes should help relieve the soreness.   Patient has OTC lozenges using prn.  Discussed magic mouthwash she did not want Rx will do OTC diphenhydramine/aluminum and magnesium hydroxide per manufacturer instructions gargle and swallow po prn if no relief with lozenges and salt water gargles. -Gargling with warm saltwater and drinking warm liquids may help. (You can make a saltwater  solution by adding 1/4 teaspoon of salt to 8 ounces, or 240 mL, of warm water.)  -Avoid oral intimate contact until symptoms resolved -Wash dishes/silverware/glasses in hot water or diswasher -Do not share glasses/silverware/dishes during meals that touch your lips/mouth -Usually no specific medical treatment is needed if a virus is causing the sore throat. The  throat most often gets better on its own within 5 to 7 days. Antibiotic medicine does not cure viral pharyngitis. DDx mononucleosis, strep throat, hand foot and mouth, post nasal drip irritation pharynx/throat -Exitcare handout on pharyngitis acute printed and given to patient FOLLOW UP with clinic provider if no improvements in the next 7-10 days or worsening of symptoms e.g. white spots back of throat, fever, trouble breathing/swallowing, drooling. Patient verbalized understanding of instructions and agreed with plan of care.  No further questions at this time.  P2: Hand washing and diet.

## 2021-07-08 NOTE — Patient Instructions (Signed)
Repeat covid test in 48 hours if new or worsening symptoms  Pharyngitis Pharyngitis is inflammation of the throat (pharynx). It is a very common cause of sore throat. Pharyngitis can be caused by a bacteria, but it is usually caused by a virus. Most cases of pharyngitis get better on their own without treatment. What are the causes? This condition may be caused by: Infection by viruses (viral). Viral pharyngitis spreads easily from person to person (is contagious) through coughing, sneezing, and sharing of personal items or utensils such as cups, forks, spoons, and toothbrushes. Infection by bacteria (bacterial). Bacterial pharyngitis may be spread by touching the nose or face after coming in contact with the bacteria, or through close contact, such as kissing. Allergies. Allergies can cause buildup of mucus in the throat (post-nasal drip), leading to inflammation and irritation. Allergies can also cause blocked nasal passages, forcing breathing through the mouth, which dries and irritates the throat. What increases the risk? You are more likely to develop this condition if: You are 23-10 years old. You are exposed to crowded environments such as daycare, school, or dormitory living. You live in a cold climate. You have a weakened disease-fighting (immune) system. What are the signs or symptoms? Symptoms of this condition vary by the cause. Common symptoms of this condition include: Sore throat. Fatigue. Low-grade fever. Stuffy nose (nasal congestion) and cough. Headache. Other symptoms may include: Glands in the neck (lymph nodes) that are swollen. Skin rashes. Plaque-like film on the throat or tonsils. This is often a symptom of bacterial pharyngitis. Vomiting. Red, itchy eyes (conjunctivitis). Loss of appetite. Joint pain and muscle aches. Enlarged tonsils. How is this diagnosed? This condition may be diagnosed based on your medical history and a physical exam. Your health care  provider will ask you questions about your illness and your symptoms. A swab of your throat may be done to check for bacteria (rapid strep test). Other lab tests may also be done, depending on the suspected cause, but these are rare. How is this treated? Many times, treatment is not needed for this condition. Pharyngitis usually gets better in 3-4 days without treatment. Bacterial pharyngitis may be treated with antibiotic medicines. Follow these instructions at home: Medicines Take over-the-counter and prescription medicines only as told by your health care provider. If you were prescribed an antibiotic medicine, take it as told by your health care provider. Do not stop taking the antibiotic even if you start to feel better. Use throat sprays to soothe your throat as told by your health care provider. Children can get pharyngitis. Do not give your child aspirin because of the association with Reye's syndrome. Managing pain To help with pain, try: Sipping warm liquids, such as broth, herbal tea, or warm water. Eating or drinking cold or frozen liquids, such as frozen ice pops. Gargling with a mixture of salt and water 3-4 times a day or as needed. To make salt water, completely dissolve -1 tsp (3-6 g) of salt in 1 cup (237 mL) of warm water. Sucking on hard candy or throat lozenges. Putting a cool-mist humidifier in your bedroom at night to moisten the air. Sitting in the bathroom with the door closed for 5-10 minutes while you run hot water in the shower.  General instructions  Do not use any products that contain nicotine or tobacco. These products include cigarettes, chewing tobacco, and vaping devices, such as e-cigarettes. If you need help quitting, ask your health care provider. Rest as told by your health  care provider. Drink enough fluid to keep your urine pale yellow. How is this prevented? To help prevent becoming infected or spreading infection: Wash your hands often with soap  and water for at least 20 seconds. If soap and water are not available, use hand sanitizer. Do not touch your eyes, nose, or mouth with unwashed hands, and wash hands after touching these areas. Do not share cups or eating utensils. Avoid close contact with people who are sick. Contact a health care provider if: You have large, tender lumps in your neck. You have a rash. You cough up green, yellow-brown, or bloody mucus. Get help right away if: Your neck becomes stiff. You drool or are unable to swallow liquids. You cannot drink or take medicines without vomiting. You have severe pain that does not go away, even after you take medicine. You have trouble breathing, and it is not caused by a stuffy nose. You have new pain and swelling in your joints such as the knees, ankles, wrists, or elbows. These symptoms may represent a serious problem that is an emergency. Do not wait to see if the symptoms will go away. Get medical help right away. Call your local emergency services (911 in the [place] Do not drive yourself to the hospital. Summary Pharyngitis is redness, pain, and swelling (inflammation) of the throat (pharynx). While pharyngitis can be caused by a bacteria, the most common causes are viral. Most cases of pharyngitis get better on their own without treatment. Bacterial pharyngitis is treated with antibiotic medicines. This information is not intended to replace advice given to you by your health care provider. Make sure you discuss any questions you have with your health care provider. Document Revised: 08/21/2020 Document Reviewed: 08/21/2020 Elsevier Patient Education  [employer].

## 2021-07-24 ENCOUNTER — Ambulatory Visit: Payer: Self-pay | Admitting: *Deleted

## 2021-07-24 ENCOUNTER — Other Ambulatory Visit: Payer: Self-pay

## 2021-07-24 VITALS — BP 118/75 | HR 67 | Ht 64.0 in | Wt 138.0 lb

## 2021-07-24 DIAGNOSIS — D72819 Decreased white blood cell count, unspecified: Secondary | ICD-10-CM

## 2021-07-24 DIAGNOSIS — R7303 Prediabetes: Secondary | ICD-10-CM

## 2021-07-24 DIAGNOSIS — Z0000 Encounter for general adult medical examination without abnormal findings: Secondary | ICD-10-CM

## 2021-07-24 NOTE — Progress Notes (Signed)
[employer] insurance premium discount evaluation: Labs Drawn. Replacements ROI form signed. Tobacco Free Attestation form signed.  Forms placed in paper chart.  

## 2021-07-25 ENCOUNTER — Encounter: Payer: Self-pay | Admitting: Registered Nurse

## 2021-07-25 LAB — CMP12+LP+TP+TSH+6AC+CBC/D/PLT
ALT: 20 IU/L (ref 0–32)
AST: 26 IU/L (ref 0–40)
Albumin/Globulin Ratio: 1.9 (ref 1.2–2.2)
Albumin: 4.6 g/dL (ref 3.8–4.8)
Alkaline Phosphatase: 87 IU/L (ref 44–121)
BUN/Creatinine Ratio: 19 (ref 12–28)
BUN: 14 mg/dL (ref 8–27)
Basophils Absolute: 0.1 10*3/uL (ref 0.0–0.2)
Basos: 2 %
Bilirubin Total: 0.6 mg/dL (ref 0.0–1.2)
Calcium: 9.8 mg/dL (ref 8.7–10.3)
Chloride: 100 mmol/L (ref 96–106)
Chol/HDL Ratio: 2.7 ratio (ref 0.0–4.4)
Cholesterol, Total: 224 mg/dL — ABNORMAL HIGH (ref 100–199)
Creatinine, Ser: 0.75 mg/dL (ref 0.57–1.00)
EOS (ABSOLUTE): 0.2 10*3/uL (ref 0.0–0.4)
Eos: 7 %
Estimated CHD Risk: <0.5 times avg. (ref 0.0–1.0)
Free Thyroxine Index: 2 (ref 1.2–4.9)
GGT: 18 IU/L (ref 0–60)
Globulin, Total: 2.4 g/dL (ref 1.5–4.5)
Glucose: 93 mg/dL (ref 70–99)
HDL: 82 mg/dL (ref >39)
Hematocrit: 44.1 % (ref 34.0–46.6)
Hemoglobin: 15.1 g/dL (ref 11.1–15.9)
Immature Grans (Abs): 0 10*3/uL (ref 0.0–0.1)
Immature Granulocytes: 0 %
Iron: 131 ug/dL (ref 27–139)
LDH: 183 IU/L (ref 119–226)
LDL Chol Calc (NIH): 131 mg/dL — ABNORMAL HIGH (ref 0–99)
Lymphocytes Absolute: 0.9 10*3/uL (ref 0.7–3.1)
Lymphs: 28 %
MCH: 31.4 pg (ref 26.6–33.0)
MCHC: 34.2 g/dL (ref 31.5–35.7)
MCV: 92 fL (ref 79–97)
Monocytes Absolute: 0.4 10*3/uL (ref 0.1–0.9)
Monocytes: 13 %
Neutrophils Absolute: 1.6 10*3/uL (ref 1.4–7.0)
Neutrophils: 50 %
Phosphorus: 3 mg/dL (ref 3.0–4.3)
Platelets: 234 10*3/uL (ref 150–450)
Potassium: 4.1 mmol/L (ref 3.5–5.2)
RBC: 4.81 x10E6/uL (ref 3.77–5.28)
RDW: 12.3 % (ref 11.7–15.4)
Sodium: 138 mmol/L (ref 134–144)
T3 Uptake Ratio: 28 % (ref 24–39)
T4, Total: 7.2 ug/dL (ref 4.5–12.0)
TSH: 1.51 u[IU]/mL (ref 0.450–4.500)
Total Protein: 7 g/dL (ref 6.0–8.5)
Triglycerides: 66 mg/dL (ref 0–149)
Uric Acid: 5.7 mg/dL (ref 3.0–7.2)
VLDL Cholesterol Cal: 11 mg/dL (ref 5–40)
WBC: 3.2 10*3/uL — ABNORMAL LOW (ref 3.4–10.8)
eGFR: 90 mL/min/{1.73_m2} (ref >59)

## 2021-07-25 LAB — HGB A1C W/O EAG: Hgb A1c MFr Bld: 5.9 % — ABNORMAL HIGH (ref 4.8–5.6)

## 2021-07-25 NOTE — Addendum Note (Signed)
Addended by: Gerarda Fraction A on: 07/25/2021 07:26 PM   Modules accepted: Orders

## 2021-07-25 NOTE — Progress Notes (Signed)
Called pt to review results. Phone says "at lunch". LVM advising results in MyChart with notes/recommendations likely available later today or this weekend. Passing for Be Well requirements. F/u with clinic on Monday or thru MyChart if any questions or concerns.

## 2021-07-25 NOTE — Progress Notes (Signed)
noted 

## 2021-09-29 ENCOUNTER — Other Ambulatory Visit: Payer: Self-pay | Admitting: Internal Medicine

## 2021-09-29 DIAGNOSIS — Z1231 Encounter for screening mammogram for malignant neoplasm of breast: Secondary | ICD-10-CM

## 2021-11-25 ENCOUNTER — Ambulatory Visit
Admission: RE | Admit: 2021-11-25 | Discharge: 2021-11-25 | Disposition: A | Discharge status: Home / Self Care | Payer: No Typology Code available for payment source | Source: Ambulatory Visit | Attending: Internal Medicine | Admitting: Internal Medicine

## 2021-11-25 DIAGNOSIS — Z1231 Encounter for screening mammogram for malignant neoplasm of breast: Secondary | ICD-10-CM | POA: Diagnosis not present

## 2021-11-27 ENCOUNTER — Other Ambulatory Visit: Payer: Self-pay | Admitting: Internal Medicine

## 2021-11-27 DIAGNOSIS — R928 Other abnormal and inconclusive findings on diagnostic imaging of breast: Secondary | ICD-10-CM

## 2021-11-27 DIAGNOSIS — N6489 Other specified disorders of breast: Secondary | ICD-10-CM

## 2021-12-12 ENCOUNTER — Ambulatory Visit
Admission: RE | Admit: 2021-12-12 | Discharge: 2021-12-12 | Disposition: A | Discharge status: Home / Self Care | Payer: No Typology Code available for payment source | Source: Ambulatory Visit | Attending: Internal Medicine | Admitting: Internal Medicine

## 2021-12-12 DIAGNOSIS — R928 Other abnormal and inconclusive findings on diagnostic imaging of breast: Secondary | ICD-10-CM | POA: Diagnosis present

## 2021-12-12 DIAGNOSIS — N6489 Other specified disorders of breast: Secondary | ICD-10-CM | POA: Diagnosis present

## 2021-12-18 ENCOUNTER — Other Ambulatory Visit: Payer: Self-pay | Admitting: Internal Medicine

## 2021-12-18 DIAGNOSIS — R928 Other abnormal and inconclusive findings on diagnostic imaging of breast: Secondary | ICD-10-CM

## 2022-01-01 ENCOUNTER — Ambulatory Visit
Admission: RE | Admit: 2022-01-01 | Discharge: 2022-01-01 | Disposition: A | Discharge status: Home / Self Care | Payer: No Typology Code available for payment source | Source: Ambulatory Visit | Attending: Internal Medicine | Admitting: Internal Medicine

## 2022-01-01 DIAGNOSIS — R928 Other abnormal and inconclusive findings on diagnostic imaging of breast: Secondary | ICD-10-CM | POA: Diagnosis present

## 2022-01-01 HISTORY — PX: BREAST BIOPSY: SHX20

## 2022-01-05 ENCOUNTER — Encounter: Payer: Self-pay | Admitting: *Deleted

## 2022-01-05 DIAGNOSIS — C50919 Malignant neoplasm of unspecified site of unspecified female breast: Secondary | ICD-10-CM

## 2022-01-05 LAB — SURGICAL PATHOLOGY

## 2022-01-05 NOTE — Progress Notes (Unsigned)
Received referral for newly diagnosed breast cancer from [facility].  Navigation initiated. Patient requested to wait until after August 20th to have the appointments set up.  She has requested to see Dr. Rogue Bussing for medical oncology, that appointment will be 8/21 at 2:00.  Dr. Bary Castilla will be on 8/22 at 4:00.

## 2022-01-25 DIAGNOSIS — C50211 Malignant neoplasm of upper-inner quadrant of right female breast: Secondary | ICD-10-CM | POA: Insufficient documentation

## 2022-01-25 NOTE — Progress Notes (Unsigned)
[facility] [place] NOTE  Patient Care Team: Kirk Ruths, MD as PCP - General (Internal Medicine) Kirk Ruths, MD (Internal Medicine) Bary Castilla, Forest Gleason, MD (General Surgery) Daiva Huge, RN as Oncology Nurse Navigator  CHIEF COMPLAINTS/PURPOSE OF CONSULTATION: Breast cancer  #  Oncology History Overview Note  IMPRESSION: Subtle architectural distortion within the upper inner quadrant of the RIGHT breast, best seen on CC views, but also seen on true lateral slice 19, without definite sonographic correlate. This may represent a complex sclerosing lesion. Stereotactic biopsy is recommended to exclude malignancy.   RECOMMENDATION: 1. Stereotactic biopsy for the subtle architectural distortion within the upper inner quadrant of the RIGHT breast. 2. If the distortion can not be adequately reproduced on the day of biopsy, recommend follow-up RIGHT breast diagnostic mammogram in 6 months. Patient is aware of this possibility.   Breast navigation team will schedule the stereotactic biopsy at patient's convenience.   I have discussed the findings and recommendations with the patient. If applicable, a reminder letter will be sent to the patient regarding the next appointment.   BI-RADS CATEGORY  4: Suspicious.     Electronically Signed   By: Franki Cabot M.D.   On: 12/12/2021 13:30  Amy Holland is a 61 y.o. female with a history of bilateral breast cancer and a family history of BRCA1 mutation.  She was diagnosed with stage I left breast cancer in 37 (age of 73).  No pathology is available (patient reports hormone receptor negative).  She underwent lumpectomy, followed by 5 cycles of Adriamycin and Cytoxan (AC) and radiation.     She had recurrent  stage IA right breast cancer s/p wide excision and sentinel lymph node biopsy on 09/24/2015.  Pathology revealed a 1.1 cm grade II invasive mammary carcinoma.  There was no lymphovascular invasion.   Margins were negative.  Two lymph nodes were negative.  ER was > 90%, PR 11-50%, and Her2/neu 1+.  Pathologic stage was T1cN0Mx.   She received 50.4 Gy from 10/28/2015 - 12/05/2015 to the right breast with a 14.4 Gy scar boost from 12/06/2015 - 12/18/2015.     MammaPrint on 09/24/2015 revealed low risk luminal-type A with a distant rate of recurrence in 5 years of 5% (CI: 1-9%) and in 10 years of 10% (CI: 4-15%).     She began Femara on 02/03/2016. Patient was noted to have osteopenia with a T score of -1.9 at the lumbar spine. 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 The uncertain significance identified in P[facility]1  # RIGHT BREAST BREAST    Carcinoma of upper-inner quadrant of right breast in female, estrogen receptor positive ([place])  01/25/2022 Initial Diagnosis   Carcinoma of upper-inner quadrant of right breast in female, estrogen receptor positive ([place])   01/27/2022 Cancer Staging   Staging form: Breast, AJCC 8th Edition - Clinical: Stage IA (cT1c, cN0, cM0, G2, ER+, PR+, HER2-) - Signed by Cammie Sickle, MD on 01/27/2022 Histologic grading system: 3 grade system      HISTORY OF PRESENTING ILLNESS: Alone.  Ambulating independently. Amy Holland 61 y.o.  female female with prior history of LEFT breast cancer/ [2017]; and stage I ER/PR positive right breast cancer [2017] and a new diagnosis of RIGHT BREAST CANCER has  been referred to [place] for further evaluation recommendations.  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   Patient states she was found to have an abnormal screening mammogram which led to diagnostic mammogram/ultrasound/followed by biopsy-as summarized above.  Patient admits to mild swelling/pain in the right breast postbiopsy.  Otherwise denies any  new lumps or bumps.   Review of Systems  Constitutional:  Negative for chills, diaphoresis, fever, malaise/fatigue and weight loss.  HENT:  Negative for nosebleeds and sore throat.   Eyes:  Negative for double vision.  Respiratory:  Negative for cough, hemoptysis, sputum production, shortness of breath and wheezing.   Cardiovascular:  Negative for chest pain, palpitations, orthopnea and leg swelling.  Gastrointestinal:  Negative for abdominal pain, blood in stool, constipation, diarrhea, heartburn, melena, nausea and vomiting.  Genitourinary:  Negative for dysuria, frequency and urgency.  Musculoskeletal:  Negative for back pain and joint pain.  Skin: Negative.  Negative for itching and rash.  Neurological:  Negative for dizziness, tingling, focal weakness, weakness and headaches.  Endo/Heme/Allergies:  Does not bruise/bleed easily.  Psychiatric/Behavioral:  Negative for depression. The patient is not nervous/anxious and does not have insomnia.      MEDICAL HISTORY:  Past Medical History:  Diagnosis Date   BRCA1 positive 10/13/2016   Breast cancer ([place]) 1994   left lumpectomy with lymph node removal c Radiation and chemo ER/PR negative, node negative   Breast cancer of lower-outer quadrant of right female breast ([place]) 09/24/2015   Right breast, 7 o'clock:11 mm, histologic grade 2, T1c, N0; ER/PR positive, HER-2/neu not overexpressed. Negative margins   Colonoscopy refused    Cologuard negative by patient report.   Depression    Headache    migraine   Lymphedema of left arm    Personal history of chemotherapy 1994   left breast ca   Personal history of radiation therapy 1994   left breast ca   Personal history of radiation therapy 2017   right breast ca    SURGICAL HISTORY: Past Surgical History:  Procedure Laterality Date   BREAST BIOPSY Right 08/12/2015   invasive mammary carcinoma   BREAST BIOPSY Right 01/01/2022   Stereo Bx, Coil Clip, Path pending   BREAST  LUMPECTOMY Left 1994   with lymph node removal Dr Amalia Hailey in [place] LUMPECTOMY Right 09/24/2015   breast cancer Right breast, 7 o'clock:11 mm, histologic grade 2, T1c, N0; ER/PR positive, HER-2/neu not overexpressed. Negative margins  LN negative   ECTOPIC PREGNANCY SURGERY  1989   LAPAROSCOPIC BILATERAL SALPINGO OOPHERECTOMY Bilateral 01/22/2020   Procedure: LAPAROSCOPIC BILATERAL SALPINGO OOPHORECTOMY;  Surgeon: Benjaman Kindler, MD;  Location: [facility] ORS;  Service: Gynecology;  Laterality: Bilateral;   PARTIAL MASTECTOMY WITH AXILLARY SENTINEL LYMPH NODE BIOPSY Right 09/24/2015   Procedure: PARTIAL MASTECTOMY WITH AXILLARY SENTINEL LYMPH NODE BIOPSY, WIDE EXCISION, RECONSTRUCTION/MASTOPLASTY;  Surgeon: Robert Bellow, MD;  Location: [facility] ORS;  Service: General;  Laterality: Right;    SOCIAL HISTORY: Social History   Socioeconomic History   Marital status: Divorced    Spouse name: Not on file   Number of children: Not on file   Years of education: Not on file   Highest education level: Not on file  Occupational History   Not on file  Tobacco Use   Smoking status: Former    Types: Cigarettes    Quit date: 06/19/1983    Years since quitting: 38.6   Smokeless tobacco: Never  Vaping Use   Vaping Use: Never used  Substance and Sexual Activity   Alcohol use: Yes    Alcohol/week: 2.0 standard drinks of alcohol    Types: 2 Glasses of wine per week    Comment: 2 drinks per day   Drug use: No   Sexual activity: Never  Other Topics Concern   Not on file  Social History Narrative   Lives in [place]/closer to [place]. Lives by self; never married; 3 grandchildren. No smoking; cocktail every day. Works for [employer].    Social Determinants of Health   Financial Resource Strain: Not on file  Food Insecurity: Not on file  Transportation Needs: Not on file  Physical Activity: Not on file  Stress: Not on file  Social Connections: Not on file  Intimate  Partner Violence: Not on file    FAMILY HISTORY: Family History  Problem Relation Age of Onset   Ovarian cancer Mother    Hypertension Father    Diabetes Father    Heart disease Father    Breast cancer Sister 66       BRCA1 positive.   Breast cancer Sister 72       BRCA negative    ALLERGIES:  is allergic to letrozole and latex.  MEDICATIONS:  Current Outpatient Medications  Medication Sig Dispense Refill   buPROPion (WELLBUTRIN XL) 150 MG 24 hr tablet Take 150 mg by mouth every morning.     cetirizine (ZYRTEC) 10 MG tablet Take 1 tablet (10 mg total) by mouth daily. 90 tablet 3   escitalopram (LEXAPRO) 20 MG tablet Take 20 mg by mouth daily.     fluticasone (FLONASE) 50 MCG/ACT nasal spray SHAKE LIQUID AND USE 1 SPRAY IN EACH NOSTRIL TWICE DAILY (Patient taking differently: Place 1 spray into both nostrils daily as needed for allergies.) 48 g 3   losartan-hydrochlorothiazide (HYZAAR) 100-12.5 MG tablet Take 1 tablet by mouth daily.     Multiple Vitamin (MULTIVITAMIN) tablet Take 1 tablet by mouth daily.     SUMAtriptan (IMITREX) 50 MG tablet Take 50 mg by mouth every 2 (two) hours as needed for migraine. May repeat in 2 hours if headache persists or recurs.     No current facility-administered medications for this visit.   Right BREAST exam [in the presence of nurse]- no unusual skin or nipple changes.  Upper inner quadrant breast mass noted however biopsy site changes noted  PHYSICAL EXAMINATION: ECOG PERFORMANCE STATUS: 0 - Asymptomatic  Vitals:   01/26/22 1400  BP: 115/66  Pulse: 70  Temp: 99.3 F (37.4 C)  SpO2: 95%   Filed Weights   01/26/22 1400  Weight: 144 lb 3.2 oz (65.4 kg)    Physical Exam Vitals and nursing note reviewed.  HENT:     Head: Normocephalic and atraumatic.     Mouth/Throat:     Pharynx: Oropharynx is clear.  Eyes:     Extraocular Movements: Extraocular movements intact.     Pupils: Pupils are equal, round, and reactive to light.   Cardiovascular:     Rate and Rhythm: Normal rate and regular rhythm.  Pulmonary:     Comments: Decreased breath sounds bilaterally.  Abdominal:     Palpations: Abdomen is soft.  Musculoskeletal:        General: Normal range of motion.     Cervical back: Normal range of motion.  Skin:    General: Skin is warm.  Neurological:     General: No focal deficit present.     Mental Status: She is alert and oriented  to person, place, and time.  Psychiatric:        Behavior: Behavior normal.        Judgment: Judgment normal.      LABORATORY DATA:  I have reviewed the data as listed Lab Results  Component Value Date   WBC 3.2 (L) 07/24/2021   HGB 15.1 07/24/2021   HCT 44.1 07/24/2021   MCV 92 07/24/2021   PLT 234 07/24/2021   Recent Labs    07/24/21 0909  NA 138  K 4.1  CL 100  GLUCOSE 93  BUN 14  CREATININE 0.75  CALCIUM 9.8  PROT 7.0  ALBUMIN 4.6  AST 26  ALT 20  ALKPHOS 87  BILITOT 0.6    RADIOGRAPHIC STUDIES: I have personally reviewed the radiological images as listed and agreed with the findings in the report. MM RT BREAST BX W LOC DEV 1ST LESION IMAGE BX SPEC STEREO GUIDE  Addendum Date: 01/02/2022   ADDENDUM REPORT: 01/02/2022 14:59 ADDENDUM: PATHOLOGY revealed: A. BREAST, RIGHT UPPER INNER QUADRANT; STEREOTACTIC CORE NEEDLE BIOPSY: - INVASIVE MAMMARY CARCINOMA, WITH LOBULAR FEATURES. Size of invasive carcinoma: 5 mm in this sample. Grade 2. Ductal carcinoma in situ: Not identified. Lymphovascular invasion: Not identified. Pathology results are CONCORDANT with imaging findings, per Dr. Valentino Saxon. Pathology results and recommendations were discussed with patient via telephone on 01/02/2022. Patient reported biopsy site doing well with no adverse symptoms, and only slight tenderness at the site. Post biopsy care instructions were reviewed, questions were answered and my direct phone number was provided. Patient was instructed to call [facility] for  any additional questions or concerns related to biopsy site. RECOMMENDATIONS: 1. Surgical consultation. Request for surgical consultation relayed to Casper Harrison RN at [facility] by Electa Sniff RN on 01/02/2022. 2. Consider pretreatment bilateral breast MRI with and without contrast to determine extent of breast disease given lobular features and history of bilateral malignancy. Pathology results reported by Electa Sniff RN on 01/02/2022. Electronically Signed   By: Valentino Saxon M.D.   On: 01/02/2022 14:59   Result Date: 01/02/2022 CLINICAL DATA:  Subtle RIGHT breast distortion. History of RIGHT breast malignancy in 2017. Remote LEFT lumpectomy EXAM: RIGHT BREAST STEREOTACTIC CORE NEEDLE BIOPSY COMPARISON:  Previous exam(s). FINDINGS: The patient and I discussed the procedure of stereotactic-guided biopsy including benefits and alternatives. We discussed the high likelihood of a successful procedure. We discussed the risks of the procedure including infection, bleeding, tissue injury, clip migration, and inadequate sampling. Informed written consent was given. The usual time out protocol was performed immediately prior to the procedure. Using sterile technique and 1% lidocaine and 1% lidocaine with epinephrine as local anesthetic, under stereotactic guidance, a 9 gauge vacuum assisted device was used to perform core needle biopsy of distortion in the RIGHT upper inner breast using a superior approach. Specimen radiograph was performed showing representative tissue. Lesion quadrant: Upper inner quadrant At the conclusion of the procedure, a COIL shaped tissue marker clip was deployed into the biopsy cavity. Follow-up 2-view mammogram was performed and dictated separately. IMPRESSION: Stereotactic-guided biopsy of subtle RIGHT breast distortion. No apparent complications. Electronically Signed: By: Valentino Saxon M.D. On: 01/01/2022 09:30   MM CLIP PLACEMENT RIGHT  Result Date:  01/01/2022 CLINICAL DATA:  Status post stereotactic guided biopsy EXAM: 3D DIAGNOSTIC RIGHT MAMMOGRAM POST STEREOTACTIC BIOPSY COMPARISON:  Previous exam(s). FINDINGS: 3D Mammographic images were obtained following stereotactic guided biopsy of subtle RIGHT upper inner breast distortion. The COIL biopsy marking clip  is mildly inferiorly displaced by approximately 1.5 cm. IMPRESSION: COIL biopsy marking clip is inferiorly displaced by approximately 1.5 cm from the site of biopsy. Final Assessment: Post Procedure Mammograms for Marker Placement Electronically Signed   By: Valentino Saxon M.D.   On: 01/01/2022 09:24   ASSESSMENT & PLAN:   Carcinoma of upper-inner quadrant of right breast in female, estrogen receptor positive ([place]) # 2023-July s/p biopsy- INVASIVE MAMMARY CARCINOMA, WITH LOBULAR FEATURES; ER/PR positive HER2 negative.  Currently s/p biopsy.   #I discussed the general treatment for breast cancer with the patient in detail-local therapies including surgery-lumpectomies/mastectomy with sentinel lymph node biopsy; and radiation.  Also discussed role of adjuvant therapy including but not limited to chemotherapy and antitherapy.  # Discussed surgical options-sentinel lymph node biopsy with lumpectomy versus mastectomy.  Discussed that lumpectomy is usually followed by adjuvant radiation.  Whereas mastectomy typically does not indicate radiation.    #Patient this time is leaning towards lumpectomy with sentinel lymph node.  Given patient's previous radiation/whole breast-understands the radiation would be a challenge.  However discussed with Dr. Crystal/Dr. Bary Castilla.   #Discussed that given patient's presurgical clinical characteristics less likely that she will need chemotherapy.  However final decision regarding chemotherapy based on final surgical pathology/gene assay.   #I discussed the role of endocrine therapy-given ER/PR positive disease postsurgery.  Patient will be offered antihormone  pill 1 a day for 5 years.  Discussed potential downsides including but not limited to arthralgias hot flashes and osteoporosis.  Patient had previous poor tolerance to AI.   # GENETICS: BRCA-1 [s/p TAH BSO]; 4 silbing [3 sisters- BRCA]; mother ovarian cancers in 1984. Pt tested for BRCA 1 in 2017.  Family aware.  Patient s/p prophylactic oophorectomy.  Thank you Dr. Ouida Sills for allowing me to participate in the care of your pleasant patient. Please do not hesitate to contact me with questions or concerns in the interim.  Discussed with Dr. Bary Castilla.   # DISPOSITION: # no labs # follow up in 1 months; MD; no labs- - Dr.B    All questions were answered. The patient/family knows to call the clinic with any problems, questions or concerns.       Cammie Sickle, MD 01/27/2022 8:11 PM

## 2022-01-25 NOTE — Assessment & Plan Note (Addendum)
#   Patient will likely need lumpectomy with sentinel lymph node evaluation; followed by radiation.  Systemic therapy options -chemotherapy or hormonal therapy breast cancer profile/which is still pending.  Also might need molecular testing [Oncotype/Mammaprint] after surgery.  Need for systemic adjuvant endocrine therapy based upon receptor status [which again still pending]. -----------------------------------------  # I had a long discussion with the patient in general regarding the treatment options of breast cancer including-surgery; adjuvant radiation; role of adjuvant systemic therapy including-chemotherapy antihormone therapy.    # Discussed surgical options-sentinel lymph node biopsy with lumpectomy versus mastectomy.  Discussed that lumpectomy is usually followed by adjuvant radiation.  Whereas mastectomy typically does not indicate radiation.   Based off clinical trials-[usually patients age> 70] the risk of recurrence from post radiation for low risk ER/PR positive breast cancer-1% versus around 10% with radiation.   #Discussed that given patient's presurgical clinical characteristics less likely that she will need chemotherapy.  However final decision regarding chemotherapy based on final surgical pathology/gene assay.   #I discussed the role of endocrine therapy-given ER/PR positive disease postsurgery.  Patient will be offered antihormone pill 1 a day for 5 years.  Discussed potential downsides including but not limited to arthralgias hot flashes and osteoporosis.   # GENETICS:  # DISPOSITION:

## 2022-01-26 ENCOUNTER — Encounter: Payer: Self-pay | Admitting: Internal Medicine

## 2022-01-26 ENCOUNTER — Encounter: Payer: Self-pay | Admitting: *Deleted

## 2022-01-26 ENCOUNTER — Inpatient Hospital Stay: Payer: No Typology Code available for payment source | Attending: Internal Medicine | Admitting: Internal Medicine

## 2022-01-26 ENCOUNTER — Inpatient Hospital Stay: Payer: No Typology Code available for payment source

## 2022-01-26 DIAGNOSIS — Z853 Personal history of malignant neoplasm of breast: Secondary | ICD-10-CM | POA: Insufficient documentation

## 2022-01-26 DIAGNOSIS — Z803 Family history of malignant neoplasm of breast: Secondary | ICD-10-CM | POA: Diagnosis not present

## 2022-01-26 DIAGNOSIS — Z87891 Personal history of nicotine dependence: Secondary | ICD-10-CM | POA: Diagnosis not present

## 2022-01-26 DIAGNOSIS — Z1501 Genetic susceptibility to malignant neoplasm of breast: Secondary | ICD-10-CM | POA: Diagnosis not present

## 2022-01-26 DIAGNOSIS — Z9221 Personal history of antineoplastic chemotherapy: Secondary | ICD-10-CM | POA: Diagnosis not present

## 2022-01-26 DIAGNOSIS — Z923 Personal history of irradiation: Secondary | ICD-10-CM | POA: Diagnosis not present

## 2022-01-26 DIAGNOSIS — Z170 Estrogen receptor positive status [ER+]: Secondary | ICD-10-CM | POA: Diagnosis not present

## 2022-01-26 DIAGNOSIS — Z1502 Genetic susceptibility to malignant neoplasm of ovary: Secondary | ICD-10-CM | POA: Insufficient documentation

## 2022-01-26 DIAGNOSIS — C50211 Malignant neoplasm of upper-inner quadrant of right female breast: Secondary | ICD-10-CM

## 2022-01-26 NOTE — Progress Notes (Signed)
Accompanied patient to initial medical oncology appointment with Dr. Jacinto Reap.  She will see Dr. Bary Castilla tomorrow.

## 2022-01-29 ENCOUNTER — Other Ambulatory Visit: Payer: Self-pay | Admitting: General Surgery

## 2022-01-29 DIAGNOSIS — C50211 Malignant neoplasm of upper-inner quadrant of right female breast: Secondary | ICD-10-CM

## 2022-02-10 ENCOUNTER — Other Ambulatory Visit: Payer: Self-pay | Admitting: General Surgery

## 2022-02-10 ENCOUNTER — Ambulatory Visit
Admission: RE | Admit: 2022-02-10 | Discharge: 2022-02-10 | Disposition: A | Discharge status: Home / Self Care | Payer: No Typology Code available for payment source | Source: Ambulatory Visit | Attending: General Surgery | Admitting: General Surgery

## 2022-02-10 DIAGNOSIS — C50211 Malignant neoplasm of upper-inner quadrant of right female breast: Secondary | ICD-10-CM

## 2022-02-10 MED ORDER — GADOBUTROL 1 MMOL/ML IV SOLN
7.0000 mL | Freq: Once | INTRAVENOUS | Status: AC | PRN
  Administered 2022-02-10: 7 mL via INTRAVENOUS

## 2022-02-18 ENCOUNTER — Encounter: Payer: Self-pay | Admitting: Registered Nurse

## 2022-02-18 ENCOUNTER — Telehealth: Payer: Self-pay | Admitting: Registered Nurse

## 2022-02-18 DIAGNOSIS — D72819 Decreased white blood cell count, unspecified: Secondary | ICD-10-CM

## 2022-02-18 DIAGNOSIS — R7303 Prediabetes: Secondary | ICD-10-CM

## 2022-02-18 NOTE — Telephone Encounter (Signed)
Late Entry  Patient contacted as did not see follow up Hgba1c or CBC in Epic.  Patient stated has not changed diet much or exercise since [facility] labs.  Recent notification that her breast cancer has returned and she is discussing options with her treatment team.  Mastectomy recommended by her surgeon as previous lumpectomy and radiation and cancer has returned twice.  Patient currently is not utilizing breast cancer support group.  She stated she would check at [facility] when she goes for appt later this week to see if any offered on campus.  Discussed national breast cancer organizations with patient and options for virtual online support groups example [employer] foundation.  Also discussed [facility] wellness offerings and another employee who has had breast cancer in the previous year and has given permission to share her information with any other coworkers who are diagnosed with breast cancer/want to speak with her about her experiences.  Patient stated she would follow up with her PCM/care team.  Did not want to have labs drawn at this time in [facility].  Patient verbalized understanding information and had no further questions at this time.  To find a support group, call the [place] at 1-877 [facility] [phone]) [phone]) or email at [email or web address]  [email or web address]  [email or web address]

## 2022-02-20 ENCOUNTER — Encounter: Payer: Self-pay | Admitting: Plastic Surgery

## 2022-02-20 ENCOUNTER — Ambulatory Visit (INDEPENDENT_AMBULATORY_CARE_PROVIDER_SITE_OTHER): Payer: No Typology Code available for payment source | Admitting: Plastic Surgery

## 2022-02-20 ENCOUNTER — Telehealth: Payer: Self-pay | Admitting: Internal Medicine

## 2022-02-20 VITALS — BP 116/72 | HR 72 | Temp 98.0°F | Resp 16 | Ht 64.0 in | Wt 143.0 lb

## 2022-02-20 DIAGNOSIS — C50211 Malignant neoplasm of upper-inner quadrant of right female breast: Secondary | ICD-10-CM | POA: Diagnosis not present

## 2022-02-20 DIAGNOSIS — R7309 Other abnormal glucose: Secondary | ICD-10-CM

## 2022-02-20 DIAGNOSIS — Z1509 Genetic susceptibility to other malignant neoplasm: Secondary | ICD-10-CM

## 2022-02-20 DIAGNOSIS — Z923 Personal history of irradiation: Secondary | ICD-10-CM

## 2022-02-20 DIAGNOSIS — F325 Major depressive disorder, single episode, in full remission: Secondary | ICD-10-CM

## 2022-02-20 DIAGNOSIS — Z8481 Family history of carrier of genetic disease: Secondary | ICD-10-CM

## 2022-02-20 DIAGNOSIS — Z170 Estrogen receptor positive status [ER+]: Secondary | ICD-10-CM | POA: Diagnosis not present

## 2022-02-20 DIAGNOSIS — Z1501 Genetic susceptibility to malignant neoplasm of breast: Secondary | ICD-10-CM

## 2022-02-20 NOTE — Progress Notes (Signed)
Patient ID: Amy Holland, female    DOB: 23-Apr-1961, 61 y.o.   MRN: [id number]   Chief Complaint  Patient presents with   Consult   Breast Cancer    The patient is a 61 year old female here for consultation for breast reconstruction.  She is seeing Dr. Bary Castilla at [facility].  She is 5 feet 4 inches tall and weighs 143 pounds.  Her preoperative bra size is a 36 DD but the right is significantly larger than the left.  The patient had left-sided breast cancer in the past and had a partial mastectomy with radiation 1994.  She was then diagnosed with right sided breast cancer and had a partial mastectomy followed by radiation 6 years ago.  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  She was diagnosed with BRCA positive gene as well as her sisters.  She was recently diagnosed with recurrent right breast cancer.  The carcinoma is in the upper inner quadrant of the right breast and is estrogen positive and progesterone positive but is HER2 negative.  She is trying to decide between a lumpectomy and right breast mastectomy.  She does not give any indication that she has thought about bilateral mastectomies given her BRCA positivity.  She did have a sister that had bilateral prophylactic mastectomies with a Diep flap reconstruction at [facility].     Review of Systems  Constitutional: Negative.   HENT: Negative.    Eyes: Negative.   Respiratory: Negative.  Negative for chest tightness and shortness of breath.   Cardiovascular: Negative.  Negative for leg swelling.  Endocrine: Negative.   Genitourinary: Negative.   Musculoskeletal: Negative.     Past Medical History:  Diagnosis Date   BRCA1 positive 10/13/2016   Breast cancer ([place]) 1994   left lumpectomy with lymph node removal c Radiation and chemo ER/PR negative, node negative   Breast cancer of lower-outer quadrant of right female breast ([place]) 09/24/2015   Right breast, 7 o'clock:11 mm, histologic grade 2, T1c,  N0; ER/PR positive, HER-2/neu not overexpressed. Negative margins   Colonoscopy refused    Cologuard negative by patient report.   Depression    Headache    migraine   Lymphedema of left arm    Personal history of chemotherapy 1994   left breast ca   Personal history of radiation therapy 1994   left breast ca   Personal history of radiation therapy 2017   right breast ca    Past Surgical History:  Procedure Laterality Date   BREAST BIOPSY Right 08/12/2015   invasive mammary carcinoma   BREAST BIOPSY Right 01/01/2022   Stereo Bx, Coil Clip, Path pending   BREAST LUMPECTOMY Left 1994   with lymph node removal Dr Amalia Hailey in [place] LUMPECTOMY Right 09/24/2015   breast cancer Right breast, 7 o'clock:11 mm, histologic grade 2, T1c, N0; ER/PR positive, HER-2/neu not overexpressed. Negative margins  LN negative   ECTOPIC PREGNANCY SURGERY  1989   LAPAROSCOPIC BILATERAL SALPINGO OOPHERECTOMY Bilateral 01/22/2020   Procedure: LAPAROSCOPIC BILATERAL SALPINGO OOPHORECTOMY;  Surgeon: Benjaman Kindler, MD;  Location: [facility] ORS;  Service: Gynecology;  Laterality: Bilateral;   PARTIAL MASTECTOMY WITH AXILLARY SENTINEL LYMPH NODE BIOPSY Right 09/24/2015   Procedure: PARTIAL MASTECTOMY WITH AXILLARY SENTINEL LYMPH NODE BIOPSY, WIDE EXCISION, RECONSTRUCTION/MASTOPLASTY;  Surgeon: Robert Bellow, MD;  Location: [facility] ORS;  Service: General;  Laterality: Right;      Current Outpatient Medications:    buPROPion (  WELLBUTRIN XL) 150 MG 24 hr tablet, Take 150 mg by mouth every morning., Disp: , Rfl:    cetirizine (ZYRTEC) 10 MG tablet, Take 1 tablet (10 mg total) by mouth daily., Disp: 90 tablet, Rfl: 3   escitalopram (LEXAPRO) 20 MG tablet, Take 20 mg by mouth daily., Disp: , Rfl:    fluticasone (FLONASE) 50 MCG/ACT nasal spray, SHAKE LIQUID AND USE 1 SPRAY IN EACH NOSTRIL TWICE DAILY (Patient taking differently: Place 1 spray into both nostrils daily as needed for allergies.), Disp: 48 g,  Rfl: 3   losartan-hydrochlorothiazide (HYZAAR) 100-12.5 MG tablet, Take 1 tablet by mouth daily., Disp: , Rfl:    Multiple Vitamin (MULTIVITAMIN) tablet, Take 1 tablet by mouth daily., Disp: , Rfl:    SUMAtriptan (IMITREX) 50 MG tablet, Take 50 mg by mouth every 2 (two) hours as needed for migraine. May repeat in 2 hours if headache persists or recurs., Disp: , Rfl:    Objective:   Vitals:   02/20/22 1015  BP: 116/72  Pulse: 72  Resp: 16  Temp: 98 F (36.7 C)  SpO2: 98%    Physical Exam Constitutional:      Appearance: Normal appearance.  HENT:     Head: Normocephalic and atraumatic.  Cardiovascular:     Rate and Rhythm: Normal rate.     Pulses: Normal pulses.  Pulmonary:     Effort: Pulmonary effort is normal.  Abdominal:     General: There is no distension.     Palpations: Abdomen is soft.  Musculoskeletal:        General: No swelling or deformity.  Skin:    General: Skin is warm.     Capillary Refill: Capillary refill takes less than 2 seconds.     Coloration: Skin is not jaundiced.     Findings: No bruising.  Neurological:     Mental Status: She is alert.  Psychiatric:        Mood and Affect: Mood normal.        Behavior: Behavior normal.        Thought Content: Thought content normal.        Judgment: Judgment normal.     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  There are two general categories of reconstruction.  We can reconstruction a breast with implants or use the patient's own tissue.  These were further discussed as listed.  Breast reconstruction is an optional procedure and eligibility depends on the full spectrum of the health of the patient and any  co-morbidities.  More than one surgery is often needed to complete the reconstruction process.  The process can take three to twelve months to complete.  The breasts will not be identical due to many factors such as rib differences, shoulder asymmetry and treatments such as radiation.  The goal is to get the breasts to look normal and symmetrical in clothes.  Scars are a part of surgery and may fade some in time but will always be present under clothes.  Surgery may be an option on the non-cancer breast to achieve more symmetry.  No matter which procedure is chosen there is always the risk of complications and even failure of the body to heal.  This  could result in no breast.    The options for reconstruction include:  1. Placement of a tissue expander with Acellular dermal matrix. When the expander is the desired size surgery is performed to remove the expander and place an implant.  In some cases the implant can be placed without an expander.  2. Autologous reconstruction can include using a muscle or tissue from another area of the body to create a breast.  3. Combined procedures (ie. latissismus dorsi flap) can be done with an expander / implant placed under the muscle.   The risks, benefits, scars and recovery time were discussed for each of the above. Risks include bleeding, infection, hematoma, seroma, scarring, pain, wound healing complications, flap loss, fat necrosis, capsular contracture, need for implant removal, donor site complications, bulge, hernia, umbilical necrosis, need for urgent reoperation, and need for dressing changes.   The procedure the patient selected / that was best for the patient, was then discussed in further detail.  Total time: 45 minutes. This includes time spent with the patient during the visit as well as time spent before and after the visit reviewing the chart, documenting the encounter, making phone calls and reviewing studies.   Given the patient's bilateral  radiation treatments she is best suited for a Diep flap reconstruction especially if she has bilateral mastectomies.  Message out to oncology as well as general surgery for further discussion.  Pictures were obtained of the patient and placed in the chart with the patient's or guardian's permission.   [place], DO

## 2022-02-20 NOTE — Telephone Encounter (Signed)
Answering service called:  Dr. Marla Roe with Plastic Surgery [phone] is requesting a phone call from Dr. B regarding breast cancer on this patient

## 2022-02-23 ENCOUNTER — Telehealth: Payer: Self-pay | Admitting: *Deleted

## 2022-02-23 ENCOUNTER — Encounter: Payer: Self-pay | Admitting: *Deleted

## 2022-02-23 NOTE — Progress Notes (Signed)
Called patient to discuss upcoming appointments.   Follow up appt. With Amy Holland on Friday has been cancelled since surgery has not been scheduled.   Amy Holland has met with Dr. Marla Roe to discuss mastectomy and reconstruction.   She would also like a second opinion from [facility] plastic surgery about DIEP reconstruction.  She will contact Dr. Dwyane Luo office to discuss this referral.

## 2022-02-23 NOTE — Telephone Encounter (Signed)
Faxed order,demographics,copy of insurance card,and recent office notes to [facility] [place].  Confirmation received and copy scanned into the chart.//AB/CMA

## 2022-02-27 ENCOUNTER — Ambulatory Visit: Payer: No Typology Code available for payment source | Admitting: Nurse Practitioner

## 2022-03-03 ENCOUNTER — Encounter: Payer: Self-pay | Admitting: Plastic Surgery

## 2022-03-03 ENCOUNTER — Ambulatory Visit (INDEPENDENT_AMBULATORY_CARE_PROVIDER_SITE_OTHER): Payer: No Typology Code available for payment source | Admitting: Plastic Surgery

## 2022-03-03 DIAGNOSIS — C50211 Malignant neoplasm of upper-inner quadrant of right female breast: Secondary | ICD-10-CM

## 2022-03-03 DIAGNOSIS — Z1501 Genetic susceptibility to malignant neoplasm of breast: Secondary | ICD-10-CM | POA: Diagnosis not present

## 2022-03-03 DIAGNOSIS — Z1509 Genetic susceptibility to other malignant neoplasm: Secondary | ICD-10-CM

## 2022-03-03 DIAGNOSIS — F325 Major depressive disorder, single episode, in full remission: Secondary | ICD-10-CM

## 2022-03-03 DIAGNOSIS — Z923 Personal history of irradiation: Secondary | ICD-10-CM

## 2022-03-03 DIAGNOSIS — R7309 Other abnormal glucose: Secondary | ICD-10-CM

## 2022-03-03 DIAGNOSIS — Z170 Estrogen receptor positive status [ER+]: Secondary | ICD-10-CM | POA: Diagnosis not present

## 2022-03-03 NOTE — Progress Notes (Signed)
   Subjective:    Patient ID: Amy Holland, female    DOB: [birth year], 61 y.o.   MRN: [id number]  The patient is a 61 year old female joining me by phone for further discussion about her breast surgery.  She is a patient of Dr. Dwyane Luo at [facility].  She is a patient of Dr. Dwyane Luo at [facility].  She has been diagnosed with breast cancer again.  Her history is that she had left-sided breast cancer with a partial mastectomy and radiation in 1994.  She then had right-sided breast cancer 6 years ago that was treated with a partial mastectomy and radiation.  It was invasive mammary carcinoma.  It was estrogen and progesterone positive and HER2 positive.  She also has BRCA positive gene and multiple family members with breast cancer and positive BRCA gene.  She now has a new right-sided upper inner quadrant.  The patient is now willing to consider a mastectomy on both sides if she has a plan for reconstruction.  She has gotten a referral to [facility] for a Diep flap and would like to talk with them before deciding on a treatment plan.     Review of Systems  Constitutional: Negative.   Eyes: Negative.   Respiratory: Negative.    Cardiovascular: Negative.   Gastrointestinal: Negative.   Endocrine: Negative.   Genitourinary: Negative.        Objective:   Physical Exam      Assessment & Plan:     ICD-10-CM   1. Major depression in remission ([place])  F32.5     2. Elevated hemoglobin A1c  R73.09     3. Carcinoma of upper-inner quadrant of right breast in female, estrogen receptor positive ([place])  C50.211    Z17.0     4. 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  The patient was at home and I was at the office.    I am very happy for the patient to have come this far in her decision.  I think is a great idea for her to talk to the folks at [facility] and see what her  options are for a Diep flap.  I remain available as needed.  We spent 10 minutes in discussion.  I discussed the limitations of evaluation and management by telemedicine. The patient expressed understanding and agreed to proceed.

## 2022-03-10 ENCOUNTER — Ambulatory Visit: Payer: No Typology Code available for payment source

## 2022-03-10 DIAGNOSIS — Z23 Encounter for immunization: Secondary | ICD-10-CM

## 2022-03-26 ENCOUNTER — Encounter: Payer: Self-pay | Admitting: Plastic Surgery

## 2022-05-10 ENCOUNTER — Telehealth: Payer: Self-pay | Admitting: Registered Nurse

## 2022-05-10 ENCOUNTER — Encounter: Payer: Self-pay | Admitting: Registered Nurse

## 2022-05-10 DIAGNOSIS — Z9010 Acquired absence of unspecified breast and nipple: Secondary | ICD-10-CM

## 2022-05-10 NOTE — Telephone Encounter (Signed)
Epic reviewed after HR Tonya notified me pateint RTW estimated 4 Dec.  S/p mastectomy and reconstruction surgery 04/10/22 breast cancer  Drain site care reviewed with patient Completed antibiotic course. Continue shoulder range of motion exercises. Do not lift more than ten pounds or do strenuous activity including gym, cooking, cleaning, laundry, and vacuuming.  Pt may continue to shower. No bath or swimming until incisions are completely healed.  Continue to wear your surgical bra during day Discussed RTW for light duty now with likely full duty return next week Follow up in 1 week  ROSE Waynetta Pean, MD 05/04/2022 10:00 AM  Plastic Surgery   Will follow up with patient onsite this week [employer]   Right breast, mastectomy:   Invasive ductal carcinoma.      [place] combined histologic grade: Grade 1.           Tubule formation score: 2.           Nuclear pleomorphism score: 2.           Mitotic rate score: 1.      Tumor size: 2 mm. Specimen edge is negative for carcinoma (carcinoma is 3 mm from the blue inked anterior specimen edge, superceded by an additionally submitted final margin). See synoptic report.      B. Left breast, mastectomy:   Ductal carcinoma in situ    Pattern: Cribriform    Nuclear grade: 2    Size: 2 mm Surgical margins are negative for DCIS No invasive carcinoma is seen. See synoptic report.     C. Right breast, true anterior margin at tumor, oriented excision:   Benign fibroadipose tissue. Negative for atypia or malignancy.     D. Right axillary sentinel lymph node, excision:   One lymph node, negative for carcinoma (0/1).      Amendment electronically signed by Massie Kluver, DO on 04/15/2022 at  8:42 AM Electronically signed by Mallinger, Beaulah Corin, DO on 04/14/2022 at  3:05 PM

## 2022-05-11 ENCOUNTER — Encounter: Payer: Self-pay | Admitting: *Deleted

## 2022-05-11 NOTE — Progress Notes (Signed)
Called patient to see if she would like to go ahead and make an appointment to follow back up with Dr. B since undergoing bil mastectomy.  No answer, left VM to return call.

## 2022-05-14 NOTE — Telephone Encounter (Signed)
Patient seen in workcenter stated doing well happy to be back at work instead of at home alone.  Work restrictions working well for her and she is wearing mask to protect herself from viral URIs circulating in community and at work. Discussed RSV/adenovirus/covid/flu seen in other employees while she was at home and numbers increasing in community since Thanksgiving.  Encouraged avoid touching face/wearing mask when around groups of people or symptomatic persons. Denied needs or concerns at this time.  A&Ox3 spoke full sentences without difficulty skin warm dry and pink respirations even and unlabored gait sure and steady in warehouse and bilateral shoulder/elbow/wrist/hand AROM full.  Patient verbalized understanding information/instructions, agreed with plan of care and had no further questions at this time.

## 2022-06-19 ENCOUNTER — Encounter (HOSPITAL_COMMUNITY): Payer: Self-pay

## 2022-06-19 ENCOUNTER — Ambulatory Visit (HOSPITAL_COMMUNITY)
Admission: EM | Admit: 2022-06-19 | Discharge: 2022-06-19 | Disposition: A | Discharge status: Home / Self Care | Payer: No Typology Code available for payment source

## 2022-06-19 DIAGNOSIS — Y990 Civilian activity done for income or pay: Secondary | ICD-10-CM

## 2022-06-19 DIAGNOSIS — S0990XA Unspecified injury of head, initial encounter: Secondary | ICD-10-CM

## 2022-06-19 DIAGNOSIS — Z026 Encounter for examination for insurance purposes: Secondary | ICD-10-CM

## 2022-06-19 DIAGNOSIS — Z042 Encounter for examination and observation following work accident: Secondary | ICD-10-CM

## 2022-06-19 NOTE — ED Provider Notes (Signed)
[place]    CSN: [id number] Arrival date & time: 06/19/22  1639      History   Chief Complaint Chief Complaint  Patient presents with   W/C - fall    HPI Amy Holland is a 62 y.o. female.  Presents after a fall that occurred at work this morning Reports she was walking up stairs, her boot caught, she fell forward.  Some of her body weight caught on her arm but she hit her head on the stairs. Reports her glasses may have cut her above the left eyebrow She did not lose consciousness.  Fall was witnessed by [employer]. No vomiting after the accident. She denies headache or dizziness. No weakness, numbness, tingling in the extremities. She has been feeling and acting normally  No medications taken Tdap updated last year  Work requiring a drug screen, and wants her evaluated before return to work [employer]  Past Medical History:  Diagnosis Date   BRCA1 positive 10/13/2016   Breast cancer ([place]) 1994   left lumpectomy with lymph node removal c Radiation and chemo ER/PR negative, node negative   Breast cancer of lower-outer quadrant of right female breast ([place]) 09/24/2015   Right breast, 7 o'clock:11 mm, histologic grade 2, T1c, N0; ER/PR positive, HER-2/neu not overexpressed. Negative margins   Colonoscopy refused    Cologuard negative by patient report.   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 10/11/2018   Seasonal allergic rhinitis due to pollen 09/09/2017   Elevated hemoglobin A1c 08/10/2017   Major depression in remission ([place]) 01/11/2017   Snoring 07/16/2016   Health  care maintenance 07/16/2016   Blood pressure elevated without history of HTN 07/16/2016   ASCUS with positive high risk human papillomavirus of vagina 07/16/2016   Osteopenia 01/31/2016   Family history of BRCA1 gene positive 12/24/2015   Breast cancer of lower-outer quadrant of right female breast ([place]) 08/13/2015   History of breast cancer in female 06/24/2015   Migraine without aura and without status migrainosus, not intractable 06/24/2015    Past Surgical History:  Procedure Laterality Date   BREAST BIOPSY Right 08/12/2015   invasive mammary carcinoma   BREAST BIOPSY Right 01/01/2022   Stereo Bx, Coil Clip, Path pending   BREAST LUMPECTOMY Left 1994   with lymph node removal Dr Amalia Hailey in [place] LUMPECTOMY Right 09/24/2015   breast cancer Right breast, 7 o'clock:11 mm, histologic grade 2, T1c, N0; ER/PR positive, HER-2/neu not overexpressed. Negative margins  LN negative   ECTOPIC PREGNANCY SURGERY  1989   LAPAROSCOPIC BILATERAL SALPINGO OOPHERECTOMY Bilateral 01/22/2020   Procedure: LAPAROSCOPIC BILATERAL SALPINGO OOPHORECTOMY;  Surgeon: Benjaman Kindler, MD;  Location: [facility] ORS;  Service: Gynecology;  Laterality: Bilateral;   PARTIAL MASTECTOMY WITH AXILLARY SENTINEL LYMPH NODE BIOPSY Right 09/24/2015   Procedure: PARTIAL MASTECTOMY WITH AXILLARY SENTINEL LYMPH NODE BIOPSY, WIDE EXCISION, RECONSTRUCTION/MASTOPLASTY;  Surgeon: Robert Bellow, MD;  Location: [facility] ORS;  Service: General;  Laterality: Right;    OB History   No obstetric history on  file.      Home Medications    Prior to Admission medications   Medication Sig Start Date End Date Taking? Authorizing Provider  buPROPion (WELLBUTRIN XL) 150 MG 24 hr tablet Take 150 mg by mouth every morning. 01/09/21   [provider]  cetirizine (ZYRTEC) 10 MG tablet Take 1 tablet (10 mg total) by mouth daily. 09/02/20   Betancourt, Aura Fey, NP  escitalopram (LEXAPRO) 20 MG tablet Take 20 mg by mouth daily.  08/10/17   [provider]  fluticasone (FLONASE) 50 MCG/ACT nasal spray SHAKE LIQUID AND USE 1 SPRAY IN EACH NOSTRIL TWICE DAILY Patient taking differently: Place 1 spray into both nostrils daily as needed for allergies. 09/05/19   Betancourt, Aura Fey, NP  losartan-hydrochlorothiazide (HYZAAR) 100-12.5 MG tablet Take 1 tablet by mouth daily. 01/02/21   [provider]  Multiple Vitamin (MULTIVITAMIN) tablet Take 1 tablet by mouth daily.    [provider]  SUMAtriptan (IMITREX) 50 MG tablet Take 50 mg by mouth every 2 (two) hours as needed for migraine. May repeat in 2 hours if headache persists or recurs.    [provider]    Family History Family History  Problem Relation Age of Onset   Ovarian cancer Mother    Hypertension Father    Diabetes Father    Heart disease Father    Breast cancer Sister 70       BRCA1 positive.   Breast cancer Sister 85       BRCA negative    Social History Social History   Tobacco Use   Smoking status: Former    Types: Cigarettes    Quit date: 06/19/1983    Years since quitting: 39.0   Smokeless tobacco: Never  Vaping Use   Vaping Use: Never used  Substance Use Topics   Alcohol use: Yes    Alcohol/week: 2.0 standard drinks of alcohol    Types: 2 Glasses of wine per week    Comment: 2 drinks per day   Drug use: No     Allergies   Letrozole and Latex   Review of Systems Review of Systems As per HPI  Physical Exam Triage Vital Signs ED Triage Vitals [06/19/22 1819]  Enc Vitals Group     BP (!) 142/75     Pulse Rate 83     Resp 18     Temp 97.9 F (36.6 C)     Temp Source Oral     SpO2 97 %     Weight      Height      Head Circumference      Peak Flow      Pain Score 3     Pain Loc      Pain Edu?      Excl. in [place]?    No data found.  Updated Vital Signs BP (!) 142/75 (BP Location: Right Arm)   Pulse 83   Temp 97.9 F (36.6 C) (Oral)   Resp 18   SpO2 97%   Physical Exam Vitals and  nursing note reviewed.  Constitutional:      General: She is not in acute distress.    Appearance: Normal appearance.  HENT:     Head: Abrasion present.     Comments: Small, scabbed over abrasion about 2 cm, just above the left lateral brow. Non tender over the orbital bones. No sign of skull fracture or depression    Mouth/Throat:     Mouth: Mucous  membranes are moist.     Pharynx: Oropharynx is clear.  Eyes:     Extraocular Movements: Extraocular movements intact.     Conjunctiva/sclera: Conjunctivae normal.     Pupils: Pupils are equal, round, and reactive to light.  Cardiovascular:     Rate and Rhythm: Normal rate and regular rhythm.     Heart sounds: Normal heart sounds.  Pulmonary:     Effort: Pulmonary effort is normal.     Breath sounds: Normal breath sounds.  Musculoskeletal:        General: Normal range of motion.     Cervical back: Normal range of motion. No rigidity.  Neurological:     General: No focal deficit present.     Mental Status: She is alert and oriented to person, place, and time.     Cranial Nerves: Cranial nerves 2-12 are intact. No cranial nerve deficit.     Sensory: Sensation is intact. No sensory deficit.     Motor: Motor function is intact. No weakness.     Coordination: Coordination normal.     Gait: Gait normal.     Comments: Strength and sensation intact throughout     UC Treatments / Results  Labs (all labs ordered are listed, but only abnormal results are displayed) Labs Reviewed - No data to display  EKG  Radiology No results found.  Procedures Procedures   Medications Ordered in UC Medications - No data to display  Initial Impression / Assessment and Plan / UC Course  I have reviewed the triage vital signs and the nursing notes.  Pertinent labs & imaging results that were available during my care of the patient were reviewed by me and considered in my medical decision making (see chart for details).  Neuro exam intact She is  well appearing with no red flags. No indication for head imaging at this time. Recommend ibuprofen 600 mg or tylenol 500 mg to use for stiffness or headache that may occur, especially tomorrow. Can use ice or warm compress as well. Discussed signs of concussion she may develop Discussed severe symptoms that would require eval in the ED  Patient does not have WC paperwork today, discussed if she would like me to fill them out can return to UC or fax. Provided work note. Return precautions discussed. Patient agrees to plan  Final Clinical Impressions(s) / UC Diagnoses   Final diagnoses:  Injury of head, initial encounter  Encounter related to worker's compensation claim     Discharge Instructions      Monitor for any change in symptoms and return to the urgent care if needed With any concerning or severe symptoms, please go to the emergency department  I recommend taking ibuprofen or Tylenol for pain.  As discussed you may have some headache over the next few days.  If you require any further Worker's Comp paperwork, please return to the urgent care. You can also fax it to [place] at [phone]. Address to me as I performed your evaluation Wells Guiles Joah Patlan, PA)    ED Prescriptions   None    PDMP not reviewed this encounter.   Kyra Leyland 06/19/22 1942

## 2022-06-19 NOTE — ED Triage Notes (Signed)
Pt states walking on her break at work around 1030am today and tripped going up the steps and hit her head on the concrete. Abrasion noted above lt eye. Denies LOC. Pt will need w/c oral drug screen. States had her TDAP in 01/2022

## 2022-06-19 NOTE — Discharge Instructions (Addendum)
Monitor for any change in symptoms and return to the urgent care if needed With any concerning or severe symptoms, please go to the emergency department  I recommend taking ibuprofen or Tylenol for pain.  As discussed you may have some headache over the next few days.  If you require any further Worker's Comp paperwork, please return to the urgent care. You can also fax it to [place] at [phone]. Address to me as I performed your evaluation Wells Guiles Kirsta Probert, PA)

## 2022-06-20 ENCOUNTER — Telehealth: Payer: Self-pay | Admitting: Registered Nurse

## 2022-06-20 ENCOUNTER — Encounter: Payer: Self-pay | Admitting: Registered Nurse

## 2022-06-20 DIAGNOSIS — W010XXD Fall on same level from slipping, tripping and stumbling without subsequent striking against object, subsequent encounter: Secondary | ICD-10-CM

## 2022-06-21 NOTE — Telephone Encounter (Signed)
Patient returned call stated feeling well denied concerns.facial swelling, headache, dizziness, visual changes, n/v, dyspnea.  Has not required pain medication or ice to face.  Has scratch on face healing.  A&Ox3 spoke full sentences without difficulty discussed to contact NP this weekend if new symptoms e.g. headache/visual changes, n/v Patient verbalized understanding information/instructions and had no further questions at this time

## 2022-07-08 NOTE — Telephone Encounter (Signed)
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

## 2022-07-13 ENCOUNTER — Other Ambulatory Visit (HOSPITAL_BASED_OUTPATIENT_CLINIC_OR_DEPARTMENT_OTHER): Payer: Self-pay | Admitting: Hematology and Oncology

## 2022-07-13 DIAGNOSIS — M8580 Other specified disorders of bone density and structure, unspecified site: Secondary | ICD-10-CM

## 2022-07-31 ENCOUNTER — Encounter: Payer: Self-pay | Admitting: Registered Nurse

## 2022-07-31 ENCOUNTER — Telehealth: Payer: Self-pay | Admitting: Registered Nurse

## 2022-07-31 DIAGNOSIS — R7303 Prediabetes: Secondary | ICD-10-CM

## 2022-07-31 DIAGNOSIS — Z0000 Encounter for general adult medical examination without abnormal findings: Secondary | ICD-10-CM

## 2022-07-31 DIAGNOSIS — Z8639 Personal history of other endocrine, nutritional and metabolic disease: Secondary | ICD-10-CM

## 2022-07-31 NOTE — Telephone Encounter (Signed)
Scheduled fasting 2/29 labs placed exec panel, A1c and D

## 2022-08-03 ENCOUNTER — Ambulatory Visit (HOSPITAL_BASED_OUTPATIENT_CLINIC_OR_DEPARTMENT_OTHER)
Admission: RE | Admit: 2022-08-03 | Discharge: 2022-08-03 | Disposition: A | Discharge status: Home / Self Care | Payer: No Typology Code available for payment source | Source: Ambulatory Visit

## 2022-08-03 DIAGNOSIS — M8580 Other specified disorders of bone density and structure, unspecified site: Secondary | ICD-10-CM | POA: Diagnosis present

## 2022-08-03 DIAGNOSIS — Z853 Personal history of malignant neoplasm of breast: Secondary | ICD-10-CM | POA: Insufficient documentation

## 2022-08-03 DIAGNOSIS — Z780 Asymptomatic menopausal state: Secondary | ICD-10-CM | POA: Insufficient documentation

## 2022-08-03 DIAGNOSIS — Z13820 Encounter for screening for osteoporosis: Secondary | ICD-10-CM | POA: Insufficient documentation

## 2022-08-03 DIAGNOSIS — Z9221 Personal history of antineoplastic chemotherapy: Secondary | ICD-10-CM | POA: Insufficient documentation

## 2022-08-06 ENCOUNTER — Other Ambulatory Visit: Payer: No Typology Code available for payment source | Admitting: Occupational Medicine

## 2022-08-06 DIAGNOSIS — Z0000 Encounter for general adult medical examination without abnormal findings: Secondary | ICD-10-CM

## 2022-08-06 NOTE — Progress Notes (Signed)
Lab drawn tolerated well no issues noted.

## 2022-08-07 ENCOUNTER — Encounter: Payer: Self-pay | Admitting: Registered Nurse

## 2022-08-07 LAB — CMP12+LP+TP+TSH+6AC+CBC/D/PLT
ALT: 25 IU/L (ref 0–32)
AST: 26 IU/L (ref 0–40)
Albumin/Globulin Ratio: 2 (ref 1.2–2.2)
Albumin: 4.9 g/dL (ref 3.9–4.9)
Alkaline Phosphatase: 98 IU/L (ref 44–121)
BUN/Creatinine Ratio: 24 (ref 12–28)
BUN: 18 mg/dL (ref 8–27)
Basophils Absolute: 0.1 10*3/uL (ref 0.0–0.2)
Basos: 2 %
Bilirubin Total: 0.4 mg/dL (ref 0.0–1.2)
Calcium: 10.6 mg/dL — ABNORMAL HIGH (ref 8.7–10.3)
Chloride: 100 mmol/L (ref 96–106)
Chol/HDL Ratio: 2.9 ratio (ref 0.0–4.4)
Cholesterol, Total: 229 mg/dL — ABNORMAL HIGH (ref 100–199)
Creatinine, Ser: 0.74 mg/dL (ref 0.57–1.00)
EOS (ABSOLUTE): 0.3 10*3/uL (ref 0.0–0.4)
Eos: 7 %
Estimated CHD Risk: <0.5 times avg. (ref 0.0–1.0)
Free Thyroxine Index: 1.7 (ref 1.2–4.9)
GGT: 22 IU/L (ref 0–60)
Globulin, Total: 2.4 g/dL (ref 1.5–4.5)
Glucose: 90 mg/dL (ref 70–99)
HDL: 79 mg/dL (ref >39)
Hematocrit: 43.2 % (ref 34.0–46.6)
Hemoglobin: 15 g/dL (ref 11.1–15.9)
Immature Grans (Abs): 0 10*3/uL (ref 0.0–0.1)
Immature Granulocytes: 0 %
Iron: 89 ug/dL (ref 27–139)
LDH: 173 IU/L (ref 119–226)
LDL Chol Calc (NIH): 133 mg/dL — ABNORMAL HIGH (ref 0–99)
Lymphocytes Absolute: 1.1 10*3/uL (ref 0.7–3.1)
Lymphs: 29 %
MCH: 30.6 pg (ref 26.6–33.0)
MCHC: 34.7 g/dL (ref 31.5–35.7)
MCV: 88 fL (ref 79–97)
Monocytes Absolute: 0.5 10*3/uL (ref 0.1–0.9)
Monocytes: 13 %
Neutrophils Absolute: 1.7 10*3/uL (ref 1.4–7.0)
Neutrophils: 49 %
Phosphorus: 3.6 mg/dL (ref 3.0–4.3)
Platelets: 260 10*3/uL (ref 150–450)
Potassium: 4.3 mmol/L (ref 3.5–5.2)
RBC: 4.9 x10E6/uL (ref 3.77–5.28)
RDW: 13.1 % (ref 11.7–15.4)
Sodium: 140 mmol/L (ref 134–144)
T3 Uptake Ratio: 26 % (ref 24–39)
T4, Total: 6.6 ug/dL (ref 4.5–12.0)
TSH: 1.79 u[IU]/mL (ref 0.450–4.500)
Total Protein: 7.3 g/dL (ref 6.0–8.5)
Triglycerides: 101 mg/dL (ref 0–149)
Uric Acid: 4.9 mg/dL (ref 3.0–7.2)
VLDL Cholesterol Cal: 17 mg/dL (ref 5–40)
WBC: 3.6 10*3/uL (ref 3.4–10.8)
eGFR: 90 mL/min/{1.73_m2} (ref >59)

## 2022-08-07 LAB — VITAMIN D 25 HYDROXY (VIT D DEFICIENCY, FRACTURES): Vit D, 25-Hydroxy: 40.8 ng/mL (ref 30.0–100.0)

## 2022-08-07 LAB — HEMOGLOBIN A1C
Est. average glucose Bld gHb Est-mCnc: 126 mg/dL
Hgb A1c MFr Bld: 6 % — ABNORMAL HIGH (ref 4.8–5.6)

## 2022-08-07 NOTE — Addendum Note (Signed)
Addended by: Gerarda Fraction A on: 08/07/2022 12:17 PM   Modules accepted: Orders

## 2022-08-07 NOTE — Progress Notes (Signed)
My chart message sent to patient  met be well requirements appt oncology 08/03/22 BP 113/64 BMI 25.54 148lbs 5.4" [place], Your calcium is elevated again.  I couldn't read the my chart message from two years ago when it was elevated also.  Have you had any diet or supplement/herb/medication changes?  Cholesterol and 3 month blood sugars elevated stable.  Follow up routine recommended with PCM. Avoid dehydration.  [facility] handouts on high fiber diet, cholesterol and elevated calcium sent to your my chart.  Have you been having any palpitations, muscle cramps or changes in urination?  I recommend repeat calcium level in 2-4 weeks, exercise 150 minutes per week; dietary fiber daily by mouth 20 grams women per up to date; eat whole grains/fruits/vegetables; keep added sugars to less than 100 calories/ 5 teaspoons for women per American Heart Association; iron, kidney/liver/thyroid function, vitamin D and complete blood count normal  Repeat hgba1c in 3 months nonfasting.  [facility] has free dietitian appts link to schedule [facility] ([email or web address])  Please let us know if you have further questions or concerns.  Be Well requirements met sign paperwork with RN Kimrey and schedule labs with her nonfasting. Sincerely, Gerarda Fraction NP-C

## 2022-08-10 ENCOUNTER — Ambulatory Visit: Payer: No Typology Code available for payment source | Admitting: Occupational Medicine

## 2022-08-10 DIAGNOSIS — I10 Essential (primary) hypertension: Secondary | ICD-10-CM

## 2022-08-10 DIAGNOSIS — R7303 Prediabetes: Secondary | ICD-10-CM

## 2022-08-10 DIAGNOSIS — Z8639 Personal history of other endocrine, nutritional and metabolic disease: Secondary | ICD-10-CM

## 2022-08-10 DIAGNOSIS — Z0000 Encounter for general adult medical examination without abnormal findings: Secondary | ICD-10-CM

## 2022-08-10 NOTE — Progress Notes (Signed)
Your calcium is elevated again.  I couldn't read the my chart message from two years ago when it was elevated also.  Have you had any diet or supplement/herb/medication changes? Anastrozole. Cholesterol and 3 month blood sugars elevated stable.  Follow up routine recommended with PCM.  Avoid dehydration.  [facility] handouts on high fiber diet, cholesterol and elevated calcium sent to your my chart.  Have you been having any palpitations, muscle cramps or changes in urination? Denies  NP recommend repeat calcium level in 2-4 weeks, exercise 150 minutes per week; dietary fiber daily by mouth 20 grams women per up to date; eat whole grains/fruits/vegetables; keep added sugars to less than 100 calories/ 5 teaspoons for women per American Heart Association; iron, kidney/liver/thyroid function, vitamin D and complete blood count normal   Repeat hgba1c in 3 months nonfasting.  [facility] has free dietitian appts link to schedule [facility] ([email or web address])  Please let us know if you have further questions or concerns. Schedule Recheck labs.   Be well insurance premium discount evaluation:   Epic reviewed by RN Evlyn Kanner and transcribed Labs  Tobacco attestation signed. [facility] ROI formed signed. Forms placed in the chart.   Patient given handouts for [facility] pharmacies and discount drugs list,MyChart, Tele doc setup, Tele doc Behavioral, [place] counseling and [facility] counseling.  What to do for infectious illness protocol. Given handout for list of medications that can be filled at [facility]. Given Clinic hours and Clinic Email.

## 2022-08-12 NOTE — Progress Notes (Signed)
Noted new medication for breast cancer can affect osteoporosis risk per epocrates.  This may be affecting calcium level will repeat lab as previously discussed and she should discuss with her oncology provider also.

## 2022-08-12 NOTE — Progress Notes (Signed)
Signed Be Well paperwork 2025 08/11/22 BP 113/64 and weight 148lbs.

## 2022-08-19 NOTE — Progress Notes (Signed)
Noted patient reviewed results 07/27/21 per epic

## 2022-08-20 ENCOUNTER — Other Ambulatory Visit: Payer: No Typology Code available for payment source | Admitting: Occupational Medicine

## 2022-08-20 NOTE — Progress Notes (Signed)
Lab drawn from right AC tolerated well no issues noted.  

## 2022-08-21 LAB — CALCIUM: Calcium: 10.4 mg/dL — ABNORMAL HIGH (ref 8.7–10.3)

## 2022-08-21 NOTE — Addendum Note (Signed)
Addended by: Gerarda Fraction A on: 08/21/2022 08:21 AM   Modules accepted: Orders

## 2022-11-11 ENCOUNTER — Other Ambulatory Visit: Payer: No Typology Code available for payment source | Admitting: Occupational Medicine

## 2022-11-11 DIAGNOSIS — R7303 Prediabetes: Secondary | ICD-10-CM

## 2022-11-11 NOTE — Progress Notes (Signed)
Lab drawn from Right AC tolerated well no issues noted.   

## 2022-11-12 LAB — HEMOGLOBIN A1C
Est. average glucose Bld gHb Est-mCnc: 114 mg/dL
Hgb A1c MFr Bld: 5.6 % (ref 4.8–5.6)

## 2022-11-22 NOTE — Progress Notes (Signed)
noted 

## 2022-11-30 LAB — COLOGUARD: COLOGUARD: NEGATIVE

## 2023-01-01 NOTE — Progress Notes (Signed)
Calcium normal on follow up lab 09/24/22 8.1; saw [facility] 11/11/22

## 2023-02-10 ENCOUNTER — Other Ambulatory Visit: Payer: No Typology Code available for payment source

## 2023-03-29 ENCOUNTER — Ambulatory Visit: Payer: No Typology Code available for payment source

## 2023-03-29 DIAGNOSIS — Z23 Encounter for immunization: Secondary | ICD-10-CM

## 2023-05-04 ENCOUNTER — Encounter: Payer: Self-pay | Admitting: Registered Nurse

## 2023-05-04 ENCOUNTER — Ambulatory Visit: Payer: No Typology Code available for payment source | Admitting: Registered Nurse

## 2023-05-04 VITALS — BP 130/80 | HR 81 | Temp 97.8°F | Resp 16

## 2023-05-04 DIAGNOSIS — M7711 Lateral epicondylitis, right elbow: Secondary | ICD-10-CM

## 2023-05-04 DIAGNOSIS — M7521 Bicipital tendinitis, right shoulder: Secondary | ICD-10-CM

## 2023-05-04 MED ORDER — IBUPROFEN 200 MG PO TABS: 400.0000 mg | ORAL_TABLET | Freq: Two times a day (BID) | ORAL | Status: AC | PRN

## 2023-05-04 MED ORDER — ACETAMINOPHEN 500 MG PO TABS: 1000.0000 mg | ORAL_TABLET | Freq: Four times a day (QID) | ORAL | Status: AC | PRN

## 2023-05-04 NOTE — Patient Instructions (Addendum)
Tennis Elbow Rehab Ask your health care provider which exercises are safe for you. Do exercises exactly as told by your health care provider and adjust them as directed. It is normal to feel mild stretching, pulling, tightness, or discomfort as you do these exercises. Stop right away if you feel sudden pain or your pain gets worse. Do not begin these exercises until told by your health care provider. Stretching and range-of-motion exercises These exercises warm up your muscles and joints and improve the movement and flexibility of your elbow. Wrist flexion, assisted  Straighten your left / right elbow in front of you with your palm facing down toward the floor. If told by your health care provider, bend your left / right elbow to a 90-degree angle (right angle) at your side instead of holding it straight. With your other hand, gently push over the back of your left / right hand so your fingers point toward the floor (flexion). Stop when you feel a gentle stretch on the back of your forearm. Hold this position for ___30_______ seconds. Repeat _____3_____ times. Complete this exercise ______2____ times a day. Wrist extension, assisted  Straighten your left / right elbow in front of you with your palm facing up toward the ceiling. If told by your health care provider, bend your left / right elbow to a 90-degree angle (right angle) at your side instead of holding it straight. With your other hand, gently pull your left / right hand and fingers toward the floor (extension). Stop when you feel a gentle stretch on the palm side of your forearm. Hold this position for __30________ seconds. Repeat ______3____ times. Complete this exercise ______2____ times a day. Assisted forearm rotation, supination Sit or stand with your elbows at your side. Bend your left / right elbow to a 90-degree angle (right angle). Using your uninjured hand, turn your left / right palm up toward the ceiling (supination) until  you feel a gentle stretch along the inside of your forearm. Hold this position for ___30_______ seconds. Repeat ____3______ times. Complete this exercise ___2_______ times a day. Assisted forearm rotation, pronation Sit or stand with your elbows at your side. Bend your left / right elbow to a 90-degree angle (right angle). Using your uninjured hand, turn your left / right palm down toward the floor (pronation) until you feel a gentle stretch along the outside of your forearm. Hold this position for ___30_______ seconds. Repeat ___3_______ times. Complete this exercise ___2_______ times a day. Strengthening exercises These exercises build strength and endurance in your forearm and elbow. Endurance is the ability to use your muscles for a long time, even after they get tired. Radial deviation  Stand with a _____none_____ weight or a hammer in your left / right hand. Or, sit while holding a rubber exercise band or tubing, with your left / right forearm supported on a table or countertop. Position your forearm so that the thumb is facing the ceiling, as if you are going to clap your hands. This is the neutral position. Raise your hand upward in front of you so your thumb moves toward the ceiling (radial deviation), or pull up on the rubber tubing. Keep your forearm and elbow still while you move your wrist only. Hold this position for ____30______ seconds. Slowly return to the starting position. Repeat ____3______ times. Complete this exercise ___2_______ times a day. Wrist extension, eccentric Sit with your left / right forearm palm-down and supported on a table or other surface. Let your left /  right wrist extend over the edge of the surface. Hold a ____none______ weight or a piece of exercise band or tubing in your left / right hand. If using a rubber exercise band or tubing, hold the other end of the tubing with your other hand. Use your uninjured hand to move your left / right hand up toward  the ceiling. Take your uninjured hand away and slowly return to the starting position using only your left / right hand. Lowering your arm under tension is called eccentric extension. Repeat ___3_______ times. Complete this exercise __2________ times a day. Wrist extension Do not do this exercise if it causes pain at the outside of your elbow. Only do this exercise once instructed by your health care provider. Sit with your left / right forearm supported on a table or other surface and your palm turned down toward the floor. Let your left / right wrist extend over the edge of the surface. Hold a _____none_____ weight or a piece of rubber exercise band or tubing. If you are using a rubber exercise band or tubing, hold the band or tubing in place with your other hand to provide resistance. Slowly bend your wrist so your hand moves up toward the ceiling (extension). Move only your wrist, keeping your forearm and elbow still. Hold this position for _______30___ seconds. Slowly return to the starting position. Repeat ____3______ times. Complete this exercise _____2_____ times a day. Forearm rotation, supination To do this exercise, you will need a lightweight hammer or rubber mallet. Sit with your left / right forearm supported on a table or other surface. Bend your elbow to a 90-degree angle (right angle). Position your forearm so that your palm is facing down toward the floor, with your hand resting over the edge of the table. Hold a hammer in your left / right hand. To make this exercise easier, hold the hammer near the head of the hammer. To make this exercise harder, hold the hammer near the end of the handle. Without moving your wrist or elbow, slowly rotate your forearm so your palm faces up toward the ceiling (supination). Hold this position for ___30_______ seconds. Slowly return to the starting position. Repeat _____3_____ times. Complete this exercise ____2______ times a day. Shoulder  blade squeeze Sit in a stable chair or stand with good posture. If you are sitting down, do not let your back touch the back of the chair. Your arms should be at your sides with your elbows bent to a 90-degree angle (right angle). Position your forearms so that your thumbs are facing the ceiling (neutral position). Without lifting your shoulders up, squeeze your shoulder blades tightly together. Hold this position for __30________ seconds. Slowly release and return to the starting position. Repeat _____3_____ times. Complete this exercise ____2______ times a day. This information is not intended to replace advice given to you by your health care provider. Make sure you discuss any questions you have with your health care provider. Document Revised: 08/14/2019 Document Reviewed: 08/16/2019 [employer] Patient Education  2024 [employer] Tennis Elbow  Tennis elbow (lateral epicondylitis) is inflammation of tendons in your outer forearm, near your elbow. Tendons are tissues that connect muscle to bone. When you have tennis elbow, inflammation affects the tendons that you use to bend your wrist and move your hand up. Inflammation occurs in the lower part of the upper arm bone (humerus), where the tendons connect to the bone (lateral epicondyle). Tennis elbow often affects people who play tennis, but anyone  may get the condition from repeatedly extending the wrist or turning the forearm. What are the causes? This condition is usually caused by repeatedly extending the wrist, turning the forearm, and using the hands. It can result from sports or work that requires repetitive forearm movements. In some cases, it may be caused by a sudden injury. What increases the risk? You are more likely to develop tennis elbow if you play tennis or another racket sport. You also have a higher risk if you frequently use your hands for work. Besides people who play tennis, others at greater risk include: People who use  computers. Holiday representative workers. People who work in [employer]. Musicians. Cooks. Cashiers. What are the signs or symptoms? Symptoms of this condition include: Pain and tenderness in the forearm and the outer part of the elbow. Pain may be felt only when using the arm, or it may be there all the time. A burning feeling that starts in the elbow and spreads down the forearm. A weak grip in the hand. How is this diagnosed? This condition is diagnosed based on your symptoms, your medical history, and a physical exam. You may also have X-rays or an MRI to: Confirm the diagnosis. Look for other issues. Check for tears in the ligaments, muscles, or tendons. How is this treated? Resting and icing your arm is often the first treatment. Your health care provider may also recommend: Medicines to reduce pain and inflammation. These may be in the form of a pill, topical gels, or shots of a steroid medicine (cortisone). An elbow strap to reduce stress on the area. Physical therapy. This may include massage or exercises or both. An elbow brace to restrict the movements that cause symptoms. If these treatments do not help relieve your symptoms, your health care provider may recommend surgery to remove damaged muscle and reattach healthy muscle to bone. Follow these instructions at home: If you have a brace or strap: Wear the brace or strap as told by your health care provider. Remove it only as told by your health care provider. Check the skin around the brace or strap every day. Tell your health care provider about any concerns. Loosen the brace if your fingers tingle, become numb, or turn cold and blue. Keep the brace clean. If the brace or strap is not waterproof: Do not let it get wet. Cover it with a watertight covering when you take a bath or a shower. Managing pain, stiffness, and swelling  If directed, put ice on the injured area. To do this: If you have a removable brace or strap, remove  it as told by your health care provider. Put ice in a plastic bag. Place a towel between your skin and the bag. Leave the ice on for 20 minutes, 2-3 times a day. Remove the ice if your skin turns bright red. This is very important. If you cannot feel pain, heat, or cold, you have a greater risk of damage to the area. Move your fingers often to reduce stiffness and swelling. Activity Rest your elbow and wrist and avoid activities that cause symptoms as told by your health care provider. Do physical therapy exercises as told by your health care provider. If you lift an object, lift it with your palm facing up. This reduces stress on your elbow. Lifestyle If your tennis elbow is caused by sports, check your equipment and make sure that: You use it correctly. It is good match for you. If your tennis elbow is caused by  work or computer use, take frequent breaks to stretch your arm. Talk with your employer about ways to manage your condition at work. General instructions Take over-the-counter and prescription medicines only as told by your health care provider. Do not use any products that contain nicotine or tobacco. These products include cigarettes, chewing tobacco, and vaping devices, such as e-cigarettes. If you need help quitting, ask your health care provider. Keep all follow-up visits. This is important. How is this prevented? Before and after activity: Warm up and stretch before being active. Cool down and stretch after being active. Give your body time to rest between periods of activity. During activity: Make sure to use equipment that fits you. If you play tennis, put power in your stroke with your lower body. Avoid using your arm only. Maintain physical fitness, including: Strength. Flexibility. Endurance. Do exercises to strengthen the forearm muscles. Contact a health care provider if: You have pain that gets worse or does not get better with treatment. You have numbness or  weakness in your forearm, hand, or fingers. Get help right away if: Your pain is severe. You cannot move your wrist. Summary Tennis elbow (lateral epicondylitis) is inflammation of tendons in your outer forearm, near your elbow. Common symptoms include pain and tenderness in your forearm and the outer part of your elbow. This condition is usually caused by repeatedly extending your wrist, turning your forearm, and using your hands. The first treatment is often resting and icing your arm to relieve symptoms. Further treatment may include taking medicine, getting physical therapy, wearing a brace or strap, or having surgery. This information is not intended to replace advice given to you by your health care provider. Make sure you discuss any questions you have with your health care provider. Document Revised: 12/05/2019 Document Reviewed: 12/05/2019 Elsevier Patient Education  2024 [employer] Proximal Biceps Tendinitis and Tenosynovitis  The proximal biceps tendon is a strong cord of tissue that connects the biceps muscle on the front of the upper arm to the shoulder blade. Tendinitis is inflammation of a tendon. Tenosynovitis is inflammation of the lining around the tendon (tendon sheath). These conditions often occur at the same time, and they can make it hard to bend the elbow or to turn the hand palm up (supination). Proximal biceps tendinitis and tenosynovitis are usually caused by overusing the shoulder joint and the biceps muscle. These conditions usually heal within 6 weeks. Proximal biceps tendinitis may include a grade 1 or grade 2 strain of the tendon. A grade 1 strain is mild. There is a slight pull of the tendon without any stretching or noticeable tearing of the tendon. There is usually no loss of biceps muscle strength. A grade 2 strain is moderate. There is a small tear in the tendon. The tendon is stretched, and biceps strength is usually decreased. What are the causes? This  condition may be caused by: Suddenly increasing the number of activities in which you use your elbow and biceps muscle. Sudden increase in the intensity of activities may also cause the condition. Overuse of the biceps muscle. This can happen when you do the same movements over and over, such as: Turning the palm of the hand up. Forceful straightening of the elbow. Bending the elbow. A direct, forceful hit or injury to the elbow. This is rare. What increases the risk? The following factors may make you more likely to develop this condition: Playing contact sports. Playing sports that involve throwing and overhead movements, including racket  sports, gymnastics, weight lifting, or bodybuilding. Doing physical labor. Having poor strength and flexibility of the arm and shoulder. What are the signs or symptoms? Symptoms of this condition may include: Pain and inflammation in the front of the shoulder. A feeling of warmth in the front of the shoulder. Inability to move the shoulder and elbow. This is called limited range of motion. A crackling sound (crepitus) when you move or touch the shoulder or the upper arm. In some cases, symptoms may return after treatment, and they may be long-lasting (chronic). How is this diagnosed? This condition is diagnosed based on: Your symptoms. Your medical history. Physical exam. X-ray, MRI, or ultrasound, if needed. How is this treated? Treatment for this condition depends on the severity of your injury. It may include: Resting the injured arm. Icing the injured area. Doing physical therapy. Your health care provider may also use: Medicines to treat pain and inflammation. Sound waves to treat the injured muscle (ultrasound therapy). Medicines that are injected into the muscle (corticosteroids). Medicines that numb the area (local anesthetics). Surgery. This is done if other treatments have not worked. Follow these instructions at home: Managing  pain, stiffness, and swelling     If told, put ice on the injured area. Put ice in a plastic bag. Place a towel between your skin and the bag. Leave the ice on for 20 minutes, 2-3 times a day. If told, apply heat to the affected area before you exercise. Use the heat source that your provider recommends, such as a moist heat pack or a heating pad. Place a towel between your skin and the heat source. Leave the heat on for 20-30 minutes. If your skin turns bright red, remove the ice or heat right away to prevent damage to the area. The risk of damage is higher if you cannot feel pain, heat, or cold. Move your fingers often to reduce stiffness and swelling. Raise (elevate) the injured area above the level of your heart while you are lying down. Activity You may have to avoid lifting. Ask your provider how much you can safely lift. Avoid activities that cause pain or make your condition worse. Do exercises as told by your provider. Return to your normal activities as told by your provider. Ask your provider what activities are safe for you. General instructions Take over-the-counter and prescription medicines only as told by your provider. Do not use any products that contain nicotine or tobacco. These products include cigarettes, chewing tobacco, and vaping devices, such as e-cigarettes. These can delay healing. If you need help quitting, ask your provider. Keep all follow-up visits. Your provider will monitor your injury and activity. How is this prevented? Warm up and stretch before being active. Cool down and stretch after being active. Give your body time to rest between periods of activity. Make sure any equipment that you use is fitted to you. Be safe and responsible while being active to avoid falls. Maintain physical fitness, including: Strength. Flexibility. Heart health (cardiovascular fitness). The ability to use muscles for a long time (endurance). Contact a health care  provider if: You have symptoms that get worse or do not get better after 2 weeks of treatment. You develop new symptoms. You develop severe pain. This information is not intended to replace advice given to you by your health care provider. Make sure you discuss any questions you have with your health care provider. Document Revised: 12/26/2021 Document Reviewed: 12/26/2021 Else[employer] Patient Education  2024 [employer] Distal Biceps  Tendinitis Rehab Ask your health care provider which exercises are safe for you. Do exercises exactly as told by your health care provider and adjust them as directed. It is normal to feel mild stretching, pulling, tightness, or discomfort as you do these exercises. Stop right away if you feel sudden pain or your pain gets worse. Do not begin these exercises until told by your health care provider. Stretching and range-of-motion exercises These exercises warm up your muscles and joints. They can help improve the movement and flexibility of your arm. They may also help to relieve pain and stiffness. Elbow range of motion  Stand or sit with your left / right elbow bent in a 90-degree angle (right angle). Position your forearm so that the thumb is facing the ceiling (neutral position). Slowly straighten your elbow until you feel a stretch. Hold this position for __30________ seconds. Slowly bend your elbow until you feel a stretch, or until you touch your thumb to your shoulder. Hold this position for ______30____ seconds. Repeat ____3______ times. Complete this exercise ______2____ times a day. Forearm rotation, supination  Stand up or sit with your left / right elbow bent in a 90-degree angle (right angle). Rotate your palm up until you cannot rotate it anymore (supination). Then, use your other hand to help turn your left / right forearm more. Hold this position for ____30______ seconds. Slowly return to the starting position. Repeat _____3_____ times. Complete  this exercise _____2_____ times a day. Forearm rotation, pronation  Stand up or sit with your left / right elbow bent in a 90-degree angle (right angle). Turn (rotate) your left / right palm down (pronation) until you cannot rotate it anymore. Then, use your other hand to help turn your left / right forearm more. Hold this position for ____30______ seconds. Slowly return to the starting position. Repeat ____3______ times. Complete this exercise _____2_____ times a day. Biceps stretch  Stand by a door frame. Place your left / right hand on the door frame. Keep your elbow straight during the exercise. Gently turn your body toward the opposite side until you feel a gentle stretch in the front of the elbow or upper arm. Hold this position for ____30______ seconds. Slowly return to the starting position. Repeat ____3______ times. Complete this exercise _____2_____ times a day. Strengthening exercises These exercises build strength and endurance in your arm and shoulder. Endurance is the ability to use your muscles for a long time, even after they get tired. Forearm rotation, supination  Sit with your left / right forearm supported on a table. Your elbow should be at waist height. Gently grasp a lightweight hammer near the head. As this exercise gets easier for you, try holding the hammer farther down the handle. Rest your hand over the edge of the table, palm down. Without moving your left / right elbow, slowly rotate your palm up (supination), stopping when your thumb is pointed to the opposite direction. Hold this position for_____30_____ seconds. Slowly return to the starting position. Repeat ___3_______ times. Complete this exercise ____2______ times a day. Forearm rotation, pronation  Sit with your left / right forearm supported on a table. Your elbow should be at waist height. Gently grasp a lightweight hammer near the head. As this exercise gets easier for you, try holding the hammer  farther down the handle. Rest your hand over the edge of the table, palm up. Without moving your left / right elbow, slowly rotate your palm down (pronation), stopping when your thumb is  pointed to the opposite direction. Hold this position for ____30______ seconds. Slowly return to the starting position. Repeat _____3_____ times. Complete this exercise ___2_______ times a day. Biceps curls  Sit on a chair without armrests, or stand up. If directed, hold a ____none______ lb / kg weight in your left / right hand, or hold an exercise band with both hands. Your palms should face up toward the ceiling at the starting position. Bend your left / right elbow. Move your hand up toward your shoulder. Keep your other arm straight down, in the starting position. Slowly return to the starting position. Repeat ____3______ times. Complete this exercise ___2______ times a day. Elbow extension, supine  Lie on your back (supine position). Hold a __none________ lb / kg weight in your left / right hand. Bend your left / right elbow to a 90-degree angle (right angle) so the weight is in front of your face, over your chest. Your elbow should be pointing up to the ceiling. Straighten your elbow, raising your hand toward the ceiling (extension). Use your other hand to support your left / right upper arm and to keep it still. Slowly return to the starting position. Repeat _____3_____ times. Complete this exercise _____2_____ times a day. This information is not intended to replace advice given to you by your health care provider. Make sure you discuss any questions you have with your health care provider. Document Revised: 09/26/2021 Document Reviewed: 09/26/2021 Elsevier Patient Education  2024 [employer].

## 2023-05-04 NOTE — Progress Notes (Signed)
Subjective:    Patient ID: Amy Holland, female    DOB: 19-Nov-1960, 62 y.o.   MRN: [id number]  62y/o caucasian female established patient here for evaluation right arm pain worst with carrying groceries yesterday.  Denied known injury/trauma/rash/bruising/swelling.  Has not started new fitness program or loan to another dept at work for the holidays.  Worsening over the past 2 1/2 weeks.  Has noticed more aches/pains since starting aromatase inhibitor after breast cancer recurred this year.  Trialing stand up desk at work and has helped with some discomfort in neck/shoulders versus prolonged sitting.  Stated has mouse above keyboard and does have to stretch arm for mouse use.      Review of Systems  Constitutional:  Negative for chills and fever.  HENT:  Negative for trouble swallowing and voice change.   Eyes:  Negative for photophobia and visual disturbance.  Respiratory:  Negative for cough, shortness of breath, wheezing and stridor.   Gastrointestinal:  Negative for diarrhea, nausea and vomiting.  Genitourinary:  Negative for difficulty urinating.  Musculoskeletal:  Positive for myalgias. Negative for back pain, gait problem, neck pain and neck stiffness.  Skin:  Negative for color change, pallor, rash and wound.  Neurological:  Negative for dizziness, tremors, weakness, light-headedness, numbness and headaches.  Hematological:  Negative for adenopathy.  Psychiatric/Behavioral:  Negative for agitation, confusion and sleep disturbance.        Objective:   Physical Exam Vitals reviewed.  Constitutional:      General: She is awake. She is not in acute distress.    Appearance: Normal appearance. She is well-developed, well-groomed and normal weight. She is not ill-appearing, toxic-appearing or diaphoretic.  HENT:     Head: Normocephalic and atraumatic.     Jaw: There is normal jaw occlusion.     Salivary Glands: Right salivary gland is not diffusely enlarged. Left salivary gland is  not diffusely enlarged.     Right Ear: Hearing and external ear normal.     Left Ear: Hearing and external ear normal.     Nose: Nose normal. No congestion or rhinorrhea.     Mouth/Throat:     Lips: Pink. No lesions.     Mouth: Mucous membranes are moist.     Pharynx: Oropharynx is clear.  Eyes:     General: Lids are normal. Vision grossly intact. Gaze aligned appropriately. No scleral icterus.       Right eye: No discharge.        Left eye: No discharge.     Extraocular Movements: Extraocular movements intact.     Conjunctiva/sclera: Conjunctivae normal.     Pupils: Pupils are equal, round, and reactive to light.  Neck:     Trachea: Trachea normal.  Cardiovascular:     Rate and Rhythm: Normal rate and regular rhythm.     Pulses: Normal pulses.          Radial pulses are 2+ on the right side and 2+ on the left side.  Pulmonary:     Effort: Pulmonary effort is normal. No respiratory distress.     Breath sounds: Normal breath sounds and air entry. No stridor or transmitted upper airway sounds. No wheezing.     Comments: Spoke full sentences without difficulty; no cough observed in exam room Abdominal:     General: Abdomen is flat.  Musculoskeletal:        General: Tenderness present. No swelling, deformity or signs of injury. Normal range of motion.  Right shoulder: No swelling, deformity or crepitus. Normal range of motion. Normal strength.     Left shoulder: No swelling, deformity or crepitus. Normal range of motion. Normal strength.     Right upper arm: Tenderness present. No swelling, edema, deformity, lacerations or bony tenderness.     Left upper arm: No swelling, edema, deformity, lacerations, tenderness or bony tenderness.     Right elbow: No swelling, deformity, effusion or lacerations. Normal range of motion.     Left elbow: No swelling, deformity, effusion or lacerations. Normal range of motion.     Right forearm: Tenderness present. No swelling, edema, deformity,  lacerations or bony tenderness.     Left forearm: No swelling, edema, deformity, lacerations, tenderness or bony tenderness.     Right wrist: No swelling, deformity, effusion, lacerations, tenderness, bony tenderness or crepitus. Normal range of motion.     Left wrist: No swelling, deformity, effusion, lacerations, tenderness, bony tenderness or crepitus. Normal range of motion.     Right hand: Normal strength. Normal capillary refill.     Left hand: Normal strength. Normal capillary refill.       Arms:     Cervical back: Normal range of motion and neck supple.     Right lower leg: No edema.     Left lower leg: No edema.  Lymphadenopathy:     Head:     Right side of head: No submandibular or preauricular adenopathy.     Left side of head: No submandibular or preauricular adenopathy.     Cervical:     Right cervical: No superficial cervical adenopathy.    Left cervical: No superficial cervical adenopathy.  Skin:    General: Skin is warm and dry.     Capillary Refill: Capillary refill takes less than 2 seconds.     Coloration: Skin is not ashen, cyanotic, jaundiced, mottled, pale or sallow.     Findings: No abrasion, bruising, burn, erythema, signs of injury, laceration, lesion, petechiae, rash or wound.  Neurological:     General: No focal deficit present.     Mental Status: She is alert and oriented to person, place, and time. Mental status is at baseline.     Cranial Nerves: No cranial nerve deficit.     Motor: Motor function is intact. No weakness, tremor, abnormal muscle tone or seizure activity.     Coordination: Coordination is intact. Coordination normal.     Gait: Gait is intact. Gait normal.     Comments: In/out of chair without difficulty; gait sure and steady in clinic; bilateral hand grasp equal 5/5  Psychiatric:        Attention and Perception: Attention and perception normal.        Mood and Affect: Mood and affect normal.        Speech: Speech normal.        Behavior:  Behavior normal. Behavior is cooperative.        Thought Content: Thought content normal.        Cognition and Memory: Cognition and memory normal.        Judgment: Judgment normal.       Distal biceps tendon and muscle more tender than proximal no palpable defect; forearm TTP soft tissue lateral and worsening pain with supination right only; no edema/crepitus; fitted and distributed tennis elbow strap to patient from clinic stock.  Discussed wear when awake remove to shower and sleep.  Hand wash and air dry only.    Assessment & Plan:  A-lateral epicondylitis right and biceps tendonitis right acute initial visits  P-Patient given [employer] handout on elbow tendonitis lateral with rehab exercises. Discussed suspect new biceps tendonitis and tennis elbow from position changes at desk/repeititve motions.  Move mouse close to edge of stand up desk platform and at same level as keyboard and see if improvement in symptoms.  Try to vary sitting and standing during the day as avoiding prolonged positions better for body e.g. body was created to move not hold prolonged static positions e.g. hours on the computer.  DDX side effect medication  Discussed rest, tylenol 1000mg  po q6h prn pain and if no relief trial ibuprofen 800mg  po TID prn pain take with food.  Discussed counteracts her blood pressure medication and can interact with her escitalopram Rx so sparing nsaid use, stretching, lateral epicondylitis strap and icing TID. Consider Physical Therapy referral if no relief with plan of care. Discussed with patient takes longer to resolve when ongoing for months consider orthopedics and steroid injection if no relief with NSAID, rest, ice, strap. Discussed motrin and naproxen (NSAIDS) can counteract her blood pressure medication if headache, hand/ankle/feet swelling, visual changes to stop use. Hydrate, hydrate, hydrate, avoid dehydration when taking NSAID. Follow up with PCM if no improvement in symptoms with  plan of care. Will consider orthopedics and formal PT if fails conservative treatment. Unable to write work restrictions in this clinic due to contract limitations.  Would need appt with PCM/[facility] for restrictions prn.  Patient verbalized understanding of instructions, agreed with plan of care and had no further questions at this time.    Home stretches demonstrated to patient-e.g. Arm circles, walking up wall, chest stretches, neck AROM, chin tucks, knee to chest and rock side to side on back. Consider physical therapy referral if no improvement with prescribed therapy. Ensure ergonomics correct desk at work avoid repetitive motions if possible/holding phone/laptop in hand use desk/stand. Patient was instructed to rest, ice, and ROM exercises. Activity as tolerated.  Follow up if symptoms persist or worsen or do not improve as anticipated with plan of care. Exit[employer] handout on biceps tendonitis and rehab exercises. Patient verbalized agreement and understanding of treatment plan and had no further questions at this time [employer]2: [employer].

## 2023-07-08 ENCOUNTER — Ambulatory Visit: Payer: No Typology Code available for payment source | Admitting: Registered Nurse

## 2023-07-08 ENCOUNTER — Encounter: Payer: Self-pay | Admitting: Registered Nurse

## 2023-07-08 VITALS — BP 99/59 | HR 91 | Temp 97.2°F | Resp 16

## 2023-07-08 DIAGNOSIS — J069 Acute upper respiratory infection, unspecified: Secondary | ICD-10-CM

## 2023-07-08 NOTE — Patient Instructions (Addendum)
How to Perform a Sinus Rinse A sinus rinse is a home treatment that is used to rinse your sinuses with a germ-free (sterile) mixture of salt and water (saline solution). Sinuses are air-filled spaces in your skull that are behind the bones of your face and forehead. They open into your nasal cavity. A sinus rinse can help to clear mucus, dirt, dust, or pollen from your nasal cavity. You may do a sinus rinse when you have a cold, a virus, nasal allergy symptoms, a sinus infection, or stuffiness in your nose or sinuses. What are the risks? A sinus rinse is generally safe and effective. However, there are a few risks, which include: A burning sensation in your sinuses. This may happen if you do not make the saline solution as directed. Be sure to follow all directions when making the saline solution. Nasal irritation. Infection. This may be from unclean supplies or from contaminated water. Infection from contaminated water is rare, but possible. Do not do a sinus rinse if you have had ear or nasal surgery, ear infection, or plugged ears, unless recommended by your health care provider. Supplies needed: Saline solution or powder. Distilled or sterile water to mix with saline powder. You may use boiled and cooled tap water. Boil tap water for 5 minutes; cool until it is lukewarm. Use within 24 hours. Do not use regular tap water to mix with the saline solution. Neti pot or nasal rinse bottle. These supplies release the saline solution into your nose and through your sinuses. Neti pots and nasal rinse bottles can be purchased at [employer], a health food store, or online. How to perform a sinus rinse  Wash your hands with soap and water for at least 20 seconds. If soap and water are not available, use hand sanitizer. Wash your device according to the directions that came with the product and then dry it. Use the solution that comes with your product or one that is sold separately in stores.  Follow the mixing directions on the package to mix with sterile or distilled water. Fill the device with the amount of saline solution noted in the device instructions. Stand by a sink and tilt your head sideways over the sink. Place the spout of the device in your upper nostril (the one closer to the ceiling). Gently pour or squeeze the saline solution into your nasal cavity. The liquid should drain out from the lower nostril if you are not too congested. While rinsing, breathe through your open mouth. Gently blow your nose to clear any mucus and rinse solution. Blowing too hard may cause ear pain. Turn your head in the other direction and repeat in your other nostril. Clean and rinse your device with clean water and then air-dry it. Talk with your health care provider or pharmacist if you have questions about how to do a sinus rinse. Summary A sinus rinse is a home treatment that is used to rinse your sinuses with a sterile mixture of salt and water (saline solution). You may do a sinus rinse when you have a cold, a virus, nasal allergy symptoms, a sinus infection, or stuffiness in your nose or sinuses. A sinus rinse is generally safe and effective. Follow all instructions carefully. This information is not intended to replace advice given to you by your health care provider. Make sure you discuss any questions you have with your health care provider. Document Revised: 11/11/2020 Document Reviewed: 11/11/2020 Elsevier Patient Education  2024 [employer]Viral Respiratory  Infection A respiratory infection is an illness that affects part of the respiratory system, such as the lungs, nose, or throat. A respiratory infection that is caused by a virus is called a viral respiratory infection. Common types of viral respiratory infections include: A cold. The flu (influenza). A respiratory syncytial virus (RSV) infection. What are the causes? This condition is caused by a virus. The virus may  spread through contact with droplets or direct contact with infected people or their mucus or secretions. The virus may spread from person to person (is contagious). What are the signs or symptoms? Symptoms of this condition include: A stuffy or runny nose. A sore throat or cough. Shortness of breath or difficulty breathing. Yellow or green mucus (sputum). Other symptoms may include: A fever. Sweating or chills. Fatigue. Achy muscles. A headache. How is this diagnosed? This condition may be diagnosed based on: Your symptoms. A physical exam. Testing of secretions from the nose or throat. Chest X-ray. How is this treated? This condition may be treated with medicines, such as: Antiviral medicine. This may shorten the length of time a person has symptoms. Expectorants. These make it easier to cough up mucus. Decongestant nasal sprays. Acetaminophen or NSAIDs, such as ibuprofen, to relieve fever and pain. Antibiotic medicines are not prescribed for viral infections.This is because antibiotics are designed to kill bacteria. They do not kill viruses. Follow these instructions at home: Managing pain and congestion Take over-the-counter and prescription medicines only as told by your health care provider. If you have a sore throat, gargle with a mixture of salt and water 3-4 times a day or as needed. To make salt water, completely dissolve -1 tsp (3-6 g) of salt in 1 cup (237 mL) of warm water. Use nose drops made from salt water to ease congestion and soften raw skin around your nose. Take 2 tsp (10 mL) of honey at bedtime to lessen coughing at night. Do not give honey to children who are younger than 1 year. Drink enough fluid to keep your urine pale yellow. This helps prevent dehydration and helps loosen up mucus. General instructions  Rest as much as possible. Do not drink alcohol. Do not use any products that contain nicotine or tobacco. These products include cigarettes, chewing  tobacco, and vaping devices, such as e-cigarettes. If you need help quitting, ask your health care provider. Keep all follow-up visits. This is important. How is this prevented?     Get an annual flu shot. You may get the flu shot in late summer, fall, or winter. Ask your health care provider when you should get your flu shot. Avoid spreading your infection to other people. If you are sick: Wash your hands with soap and water often, especially after you cough or sneeze. Wash for at least 20 seconds. If soap and water are not available, use alcohol-based hand sanitizer. Cover your mouth when you cough. Cover your nose and mouth when you sneeze. Do not share cups or eating utensils. Clean commonly used objects often. Clean commonly touched surfaces. Stay home from work or school as told by your health care provider. Avoid contact with people who are sick during cold and flu season. This is generally fall and winter. Contact a health care provider if: Your symptoms last for 10 days or longer. Your symptoms get worse over time. You have severe sinus pain in your face or forehead. The glands in your jaw or neck become very swollen. You have shortness of breath. Get help  right away if you: Feel pain or pressure in your chest. Have trouble breathing. Faint or feel like you will faint. Have severe and persistent vomiting. Feel confused or disoriented. These symptoms may represent a serious problem that is an emergency. Do not wait to see if the symptoms will go away. Get medical help right away. Call your local emergency services (911 in the [place] Do not drive yourself to the hospital. Summary A respiratory infection is an illness that affects part of the respiratory system, such as the lungs, nose, or throat. A respiratory infection that is caused by a virus is called a viral respiratory infection. Common types of viral respiratory infections include a cold, influenza, and respiratory  syncytial virus (RSV) infection. Symptoms of this condition include a stuffy or runny nose, cough, fatigue, achy muscles, sore throat, and fevers or chills. Antibiotic medicines are not prescribed for viral infections. This is because antibiotics are designed to kill bacteria. They are not effective against viruses. This information is not intended to replace advice given to you by your health care provider. Make sure you discuss any questions you have with your health care provider. Document Revised: 08/29/2020 Document Reviewed: 08/29/2020 Elsevier Patient Education  2024 [employer]How to Perform a Sinus Rinse A sinus rinse is a home treatment that is used to rinse your sinuses with a germ-free (sterile) mixture of salt and water (saline solution). Sinuses are air-filled spaces in your skull that are behind the bones of your face and forehead. They open into your nasal cavity. A sinus rinse can help to clear mucus, dirt, dust, or pollen from your nasal cavity. You may do a sinus rinse when you have a cold, a virus, nasal allergy symptoms, a sinus infection, or stuffiness in your nose or sinuses. What are the risks? A sinus rinse is generally safe and effective. However, there are a few risks, which include: A burning sensation in your sinuses. This may happen if you do not make the saline solution as directed. Be sure to follow all directions when making the saline solution. Nasal irritation. Infection. This may be from unclean supplies or from contaminated water. Infection from contaminated water is rare, but possible. Do not do a sinus rinse if you have had ear or nasal surgery, ear infection, or plugged ears, unless recommended by your health care provider. Supplies needed: Saline solution or powder. Distilled or sterile water to mix with saline powder. You may use boiled and cooled tap water. Boil tap water for 5 minutes; cool until it is lukewarm. Use within 24 hours. Do not use regular  tap water to mix with the saline solution. Neti pot or nasal rinse bottle. These supplies release the saline solution into your nose and through your sinuses. Neti pots and nasal rinse bottles can be purchased at [employer], a health food store, or online. How to perform a sinus rinse  Wash your hands with soap and water for at least 20 seconds. If soap and water are not available, use hand sanitizer. Wash your device according to the directions that came with the product and then dry it. Use the solution that comes with your product or one that is sold separately in stores. Follow the mixing directions on the package to mix with sterile or distilled water. Fill the device with the amount of saline solution noted in the device instructions. Stand by a sink and tilt your head sideways over the sink. Place the spout of the device in  your upper nostril (the one closer to the ceiling). Gently pour or squeeze the saline solution into your nasal cavity. The liquid should drain out from the lower nostril if you are not too congested. While rinsing, breathe through your open mouth. Gently blow your nose to clear any mucus and rinse solution. Blowing too hard may cause ear pain. Turn your head in the other direction and repeat in your other nostril. Clean and rinse your device with clean water and then air-dry it. Talk with your health care provider or pharmacist if you have questions about how to do a sinus rinse. Summary A sinus rinse is a home treatment that is used to rinse your sinuses with a sterile mixture of salt and water (saline solution). You may do a sinus rinse when you have a cold, a virus, nasal allergy symptoms, a sinus infection, or stuffiness in your nose or sinuses. A sinus rinse is generally safe and effective. Follow all instructions carefully. This information is not intended to replace advice given to you by your health care provider. Make sure you discuss any questions you  have with your health care provider. Document Revised: 11/11/2020 Document Reviewed: 11/11/2020 Elsevier Patient Education  2024 [employer].

## 2023-07-08 NOTE — Progress Notes (Signed)
Subjective:    Patient ID: Amy Holland, female    DOB: 13-Nov-1960, 63 y.o.   MRN: [id number]  63y/o established caucasian female here for evaluation body aches and cold symptoms that started yesterday.  Has not home covid tested.  Stated many sick individuals at work.  Has felt hot today and here for temperature check and evaluation.  Has not taken any tylenol or motrin today.  Would like antivirals if covid test positive or if she has the flu.  Patient stated will be switching to medicare in a few months and asking if that will change her access to clinic staff also      Review of Systems  Constitutional:  Positive for diaphoresis and fatigue. Negative for chills and fever.  HENT:  Positive for congestion, ear pain, postnasal drip, rhinorrhea and sinus pressure. Negative for ear discharge, facial swelling, hearing loss, mouth sores, nosebleeds, sinus pain, sneezing, tinnitus, trouble swallowing and voice change.   Eyes:  Negative for photophobia, discharge, itching and visual disturbance.  Respiratory:  Negative for choking, chest tightness, shortness of breath, wheezing and stridor.   Cardiovascular:  Negative for chest pain and palpitations.  Gastrointestinal:  Negative for diarrhea, nausea and vomiting.  Genitourinary:  Negative for difficulty urinating.  Musculoskeletal:  Positive for arthralgias and myalgias. Negative for gait problem, joint swelling and neck stiffness.  Allergic/Immunologic: Positive for environmental allergies.  Neurological:  Negative for dizziness, tremors, syncope, facial asymmetry, speech difficulty, weakness and light-headedness.  Hematological:  Negative for adenopathy. Does not bruise/bleed easily.  Psychiatric/Behavioral:  Negative for agitation, confusion and sleep disturbance.        Objective:   Physical Exam Vitals reviewed.  Constitutional:      General: She is awake. She is not in acute distress.    Appearance: Normal appearance. She is  well-developed, well-groomed and normal weight. She is not ill-appearing or toxic-appearing.  HENT:     Head: Normocephalic and atraumatic.     Jaw: There is normal jaw occlusion.     Salivary Glands: Right salivary gland is not diffusely enlarged or tender. Left salivary gland is not diffusely enlarged or tender.     Right Ear: Hearing, ear canal and external ear normal. No decreased hearing noted. No laceration, drainage, swelling or tenderness. A middle ear effusion is present. There is no impacted cerumen. No foreign body. No mastoid tenderness. No PE tube. No hemotympanum. Tympanic membrane is not injected, scarred, perforated, erythematous, retracted or bulging.     Left Ear: Hearing, ear canal and external ear normal. No decreased hearing noted. No laceration, drainage, swelling or tenderness. A middle ear effusion is present. There is no impacted cerumen. No foreign body. No mastoid tenderness. No PE tube. No hemotympanum. Tympanic membrane is not injected, scarred, perforated, erythematous, retracted or bulging.     Nose: Congestion and rhinorrhea present. Rhinorrhea is clear.     Right Nostril: No epistaxis.     Left Nostril: No epistaxis.     Right Turbinates: Enlarged and swollen. Not pale.     Left Turbinates: Enlarged and swollen. Not pale.     Right Sinus: No maxillary sinus tenderness or frontal sinus tenderness.     Left Sinus: No maxillary sinus tenderness or frontal sinus tenderness.     Comments: Bilateral allergic shiners; cobblestoning posterior pharynx; bilateral TMs intact; air fluid level clear; no debris bilateral auditory canals; nasal sniffing observed in clinic    Mouth/Throat:     Lips: Pink. No lesions.  Mouth: Mucous membranes are moist. No oral lesions or angioedema.     Dentition: No gum lesions.     Tongue: No lesions. Tongue does not deviate from midline.     Palate: No mass and lesions.     Pharynx: Uvula midline. Pharyngeal swelling, posterior  oropharyngeal erythema and postnasal drip present. No oropharyngeal exudate or uvula swelling.     Tonsils: No tonsillar exudate or tonsillar abscesses.  Eyes:     General: Lids are normal. Vision grossly intact. Gaze aligned appropriately. Allergic shiner present. No visual field deficit or scleral icterus.    Extraocular Movements: Extraocular movements intact.     Conjunctiva/sclera: Conjunctivae normal.     Pupils: Pupils are equal, round, and reactive to light.  Neck:     Trachea: Trachea and phonation normal.  Cardiovascular:     Rate and Rhythm: Normal rate and regular rhythm.     Pulses: Normal pulses.          Radial pulses are 2+ on the right side and 2+ on the left side.     Heart sounds: Normal heart sounds, S1 normal and S2 normal.  Pulmonary:     Effort: Pulmonary effort is normal. No respiratory distress.     Breath sounds: Normal breath sounds and air entry. No stridor. No decreased breath sounds, wheezing, rhonchi or rales.     Comments: Spoke full sentences without difficulty; no cough observed in clinic Abdominal:     Palpations: Abdomen is soft.  Musculoskeletal:        General: Normal range of motion.     Right hand: Normal strength. Normal capillary refill.     Left hand: Normal strength. Normal capillary refill.     Cervical back: Normal range of motion and neck supple. No swelling, edema, deformity, erythema, signs of trauma, lacerations, rigidity, spasms, torticollis, tenderness or crepitus. Pain with movement present. No muscular tenderness. Normal range of motion.     Thoracic back: No swelling, edema, deformity, signs of trauma, lacerations, spasms or tenderness. Normal range of motion.     Right lower leg: No edema.     Left lower leg: No edema.  Lymphadenopathy:     Head:     Right side of head: No submental, submandibular, tonsillar, preauricular, posterior auricular or occipital adenopathy.     Left side of head: No submental, submandibular, tonsillar,  preauricular, posterior auricular or occipital adenopathy.     Cervical: No cervical adenopathy.     Right cervical: No superficial, deep or posterior cervical adenopathy.    Left cervical: No superficial, deep or posterior cervical adenopathy.  Skin:    General: Skin is warm and dry.     Capillary Refill: Capillary refill takes less than 2 seconds.     Coloration: Skin is not ashen, cyanotic, jaundiced, mottled, pale or sallow.     Findings: No abrasion, abscess, acne, bruising, burn, ecchymosis, erythema, signs of injury, laceration, lesion, petechiae, rash or wound.     Nails: There is no clubbing.     Comments: Visually inspected face/neck/hands  Neurological:     General: No focal deficit present.     Mental Status: She is alert and oriented to person, place, and time. Mental status is at baseline.     GCS: GCS eye subscore is 4. GCS verbal subscore is 5. GCS motor subscore is 6.     Cranial Nerves: Cranial nerves 2-12 are intact. No cranial nerve deficit, dysarthria or facial asymmetry.  Sensory: No sensory deficit.     Motor: Motor function is intact. No weakness, tremor, atrophy, abnormal muscle tone or seizure activity.     Coordination: Coordination is intact. Coordination normal.     Gait: Gait normal.     Comments: In/out of chair without difficulty; gait sure and steady in clinic; bilateral hand grasp equal 5/5  Psychiatric:        Attention and Perception: Attention and perception normal.        Mood and Affect: Mood and affect normal.        Speech: Speech normal.        Behavior: Behavior normal. Behavior is cooperative.        Thought Content: Thought content normal.        Cognition and Memory: Cognition and memory normal.        Judgment: Judgment normal.       Home covid test negative    Assessment & Plan:   A-viral URI with cough  P-Given 1 free [place] govt home covid test to complete prior to returning to workcenter  No signs of bacterial infection on  examination.  Avoid dehydration discussed may need up to an extra liter of fluid per day with mucous/mouth breathing.  Monitor color of urine.  Discussed bland diet avoid spicy/fried/large portions meat/dairy if having nausea/sour stomach. -Advise symptomatic treatment with nasal saline, Flonase, and over-the-counter cold remedies as needed.  Discussed administration of saline first then flonase. -Advised salt water gargles for sore throat relief. -Advise to maintain hydration and rest.Honey 1 tablespoon po q4h prn cough. Tylenol 1000mg  po q6h prn pain.  Discussed reviewed epocrates medication interaction checker and able to take paxlovid if positive home test.  Discussed most common side effects e.g. bad taste in mouth and GI upset.  Discussed how to take medication e.g. 3 capsules BID x 5 days.  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  Not planning a pregnancy.  Discussed avoid intimate oral contact with others while symptomatic or having sex as could spread viral illness. OTC dayquil/nyquil/robitussin/delsym per manufacturer instructions   Discussed need to dry up post nasal drip and will help with cough/sore throat also.  Discussed many viruses circulating in community at this time adenovirus, rsv, covid, flu, norovirus and metapneumovirus.  Notify me if she develops fever greater than 100.35F or positive home covid test later this week as instructed to retest in 48 hours if no improvement in symptoms or new/worsening symptoms.  Discussed hydrate with water to keep urine pale yellow clear and voiding every 2-4 hours while awake.  Patient may use normal saline nasal spray 2 sprays each nostril q2h wa as needed. Flonase nasal 1 spray each nostril BID OTC.  May use OTC robitussin, delsym, nyquil per manufacturer instructions for cough if honey not helping enough.  Discussed post nasal drip triggering cough and sore throat need to get it dried up.  Shower BID prn congestion  Notify me via  my chart if white spots in back of throat, mucous thick/chunky/brown/foul tasting opaque as may need antibiotic if that occurs.  Normal viral mucous is clear to yellow to green than done.  Call or return to clinic as needed if these symptoms worsen or fail to improve as anticipated.   Exitcare handouts viral URI with cough, and sinus rinse sent to patient my chart.  Patient verbalized understanding of instructions, agreed with plan of care and had no further questions at this time.  P2:  Avoidance and hand washing.

## 2023-08-17 ENCOUNTER — Encounter: Payer: Self-pay | Admitting: Registered Nurse

## 2023-08-17 ENCOUNTER — Telehealth: Payer: Self-pay | Admitting: Registered Nurse

## 2023-08-17 DIAGNOSIS — Z0000 Encounter for general adult medical examination without abnormal findings: Secondary | ICD-10-CM

## 2023-08-17 NOTE — Telephone Encounter (Signed)
 Be Well 2026 appt 08/19/2023 scheduled; orders entered for patient executive panel with [facility]  Will need to see RN Olegario Messier next week for results, Ht/Wt/BP check, sign tobacco attestation and Be Well forms.

## 2023-08-19 ENCOUNTER — Other Ambulatory Visit

## 2023-08-19 DIAGNOSIS — R7303 Prediabetes: Secondary | ICD-10-CM

## 2023-08-19 DIAGNOSIS — Z0000 Encounter for general adult medical examination without abnormal findings: Secondary | ICD-10-CM

## 2023-08-20 LAB — CMP12+LP+TP+TSH+6AC+CBC/D/PLT
ALT: 27 IU/L (ref 0–32)
AST: 29 IU/L (ref 0–40)
Albumin: 4.8 g/dL (ref 3.9–4.9)
Alkaline Phosphatase: 104 IU/L (ref 44–121)
BUN/Creatinine Ratio: 15 (ref 12–28)
BUN: 11 mg/dL (ref 8–27)
Basophils Absolute: 0.1 10*3/uL (ref 0.0–0.2)
Basos: 3 %
Bilirubin Total: 0.5 mg/dL (ref 0.0–1.2)
Calcium: 10.4 mg/dL — ABNORMAL HIGH (ref 8.7–10.3)
Chloride: 100 mmol/L (ref 96–106)
Chol/HDL Ratio: 2.6 ratio (ref 0.0–4.4)
Cholesterol, Total: 227 mg/dL — ABNORMAL HIGH (ref 100–199)
Creatinine, Ser: 0.75 mg/dL (ref 0.57–1.00)
EOS (ABSOLUTE): 0.2 10*3/uL (ref 0.0–0.4)
Eos: 6 %
Estimated CHD Risk: <0.5 times avg. (ref 0.0–1.0)
Free Thyroxine Index: 1.9 (ref 1.2–4.9)
GGT: 20 IU/L (ref 0–60)
Globulin, Total: 2.2 g/dL (ref 1.5–4.5)
Glucose: 89 mg/dL (ref 70–99)
HDL: 87 mg/dL (ref >39)
Hematocrit: 43.5 % (ref 34.0–46.6)
Hemoglobin: 14.7 g/dL (ref 11.1–15.9)
Immature Grans (Abs): 0 10*3/uL (ref 0.0–0.1)
Immature Granulocytes: 0 %
Iron: 75 ug/dL (ref 27–139)
LDH: 180 IU/L (ref 119–226)
LDL Chol Calc (NIH): 126 mg/dL — ABNORMAL HIGH (ref 0–99)
Lymphocytes Absolute: 0.8 10*3/uL (ref 0.7–3.1)
Lymphs: 25 %
MCH: 31.7 pg (ref 26.6–33.0)
MCHC: 33.8 g/dL (ref 31.5–35.7)
MCV: 94 fL (ref 79–97)
Monocytes Absolute: 0.3 10*3/uL (ref 0.1–0.9)
Monocytes: 10 %
Neutrophils Absolute: 1.8 10*3/uL (ref 1.4–7.0)
Neutrophils: 56 %
Phosphorus: 3.5 mg/dL (ref 3.0–4.3)
Platelets: 242 10*3/uL (ref 150–450)
Potassium: 4.2 mmol/L (ref 3.5–5.2)
RBC: 4.64 x10E6/uL (ref 3.77–5.28)
RDW: 12.1 % (ref 11.7–15.4)
Sodium: 140 mmol/L (ref 134–144)
T3 Uptake Ratio: 28 % (ref 24–39)
T4, Total: 6.7 ug/dL (ref 4.5–12.0)
TSH: 2.19 u[IU]/mL (ref 0.450–4.500)
Total Protein: 7 g/dL (ref 6.0–8.5)
Triglycerides: 80 mg/dL (ref 0–149)
Uric Acid: 5.5 mg/dL (ref 3.0–7.2)
VLDL Cholesterol Cal: 14 mg/dL (ref 5–40)
WBC: 3.3 10*3/uL — ABNORMAL LOW (ref 3.4–10.8)
eGFR: 88 mL/min/{1.73_m2} (ref >59)

## 2023-08-20 LAB — HGB A1C W/O EAG: Hgb A1c MFr Bld: 5.7 % — ABNORMAL HIGH (ref 4.8–5.6)

## 2023-08-21 ENCOUNTER — Encounter: Payer: Self-pay | Admitting: Registered Nurse

## 2023-09-02 NOTE — Telephone Encounter (Signed)
 Patient met requirements for insurance discount starting 08 Mar 2024 [facility] form completed and NP signed Be Well forms 09/02/2023 [facility] 5.7 BP 123/81 weight 144lbs BMI 25.91 patient replied negative for tobacco use and signed attestation form with RN Olegario Messier 08/19/2023

## 2023-11-30 ENCOUNTER — Ambulatory Visit: Payer: Self-pay | Admitting: Registered Nurse

## 2023-11-30 DIAGNOSIS — D72819 Decreased white blood cell count, unspecified: Secondary | ICD-10-CM

## 2023-11-30 DIAGNOSIS — R7303 Prediabetes: Secondary | ICD-10-CM

## 2024-01-14 NOTE — Progress Notes (Signed)
 Patient came to clinic stated she reviewed her lab results in my chart and had no further questions.  She stated she will be on [employer].  Discussed she can still be seen in [facility] if work injury, communicable disease policy e.g. positive flu/covid test, vomiting/diarrhea, fever related absence and to see clinic nurse prn.  Patient stated she planned to follow up with [facility] and had no further questions at this time.

## 2024-01-18 ENCOUNTER — Ambulatory Visit: Admitting: Registered Nurse

## 2024-01-18 ENCOUNTER — Encounter: Payer: Self-pay | Admitting: Registered Nurse

## 2024-01-18 VITALS — BP 116/76 | HR 80

## 2024-01-18 DIAGNOSIS — S8001XA Contusion of right knee, initial encounter: Secondary | ICD-10-CM

## 2024-01-18 DIAGNOSIS — S5012XA Contusion of left forearm, initial encounter: Secondary | ICD-10-CM

## 2024-01-18 DIAGNOSIS — M62838 Other muscle spasm: Secondary | ICD-10-CM

## 2024-01-18 DIAGNOSIS — W010XXA Fall on same level from slipping, tripping and stumbling without subsequent striking against object, initial encounter: Secondary | ICD-10-CM

## 2024-01-18 MED ORDER — CYCLOBENZAPRINE HCL 10 MG PO TABS: 5.0000 mg | ORAL_TABLET | Freq: Three times a day (TID) | ORAL | Status: AC | PRN

## 2024-01-18 NOTE — Progress Notes (Signed)
 S: EE states she was carrying a tray that was heavy about 10:30 this morning and was going to set the tray on a cart when she caught her toe on another cart and this caused her to go to the floor striking her right knee and bumping her left elbow as well.  O:  Exam reveals abrasions to two spots on the right knee just below the patella and then about 2 inches farther down the leg.  No noticeable swelling of the area and it has been about 1.5 hours since her fall.  Left elbow only has a quarter size bluish area.  Ice pack applied to the right knee for 15 minutes.  A:  Contusion to right knee and left elbow with abrasions to the right knee as well.  P:  Apply ice as needed for next 24 hours to control swelling or reduce discomfort.  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  Apolinar Sharps, RN, MPH, BSN, COHN-S

## 2024-01-18 NOTE — Progress Notes (Signed)
 Established Patient Office Visit  Subjective   Patient ID: Amy Holland, female    DOB: Feb 20, 1961  Age: 63 y.o. MRN: [id number]  Chief Complaint  Patient presents with   Fall    At work carrying lamp in warehouse    63y/o established female caucasian female here for evaluation s/p fall at work tripped on box on floor when carrying another box with lamp.  Landed on right knee and left elbow.  Some pain both; scratch right knee and bruising right knee and left elbow.  Muscles feeling a bit tight.  Denied hitting head, n/v/d/loc/dyspnea/weakness/dyphagia/dysphasia.  Has reusable ice pack at home.      Review of Systems  Constitutional:  Negative for chills, diaphoresis, fever and malaise/fatigue.  HENT:  Negative for congestion, ear discharge, ear pain, hearing loss, nosebleeds, sinus pain, sore throat and tinnitus.   Eyes:  Negative for blurred vision, double vision, photophobia, pain, discharge and redness.  Respiratory:  Negative for cough, hemoptysis, sputum production, shortness of breath, wheezing and stridor.   Cardiovascular:  Negative for chest pain, palpitations and leg swelling.  Gastrointestinal:  Negative for diarrhea, nausea and vomiting.  Genitourinary:  Negative for dysuria.  Musculoskeletal:  Positive for falls, joint pain and myalgias.  Skin:  Positive for rash. Negative for itching.  Neurological:  Negative for dizziness, tingling, tremors, sensory change, speech change, focal weakness, seizures, loss of consciousness, weakness and headaches.  Psychiatric/Behavioral:  The patient is not nervous/anxious.       Objective:     BP 116/76 (BP Location: Left Arm, Patient Position: Sitting, Cuff Size: Normal)   Pulse 80   SpO2 100%    Physical Exam Vitals and nursing note reviewed.  Constitutional:      General: She is awake. She is not in acute distress.    Appearance: Normal appearance. She is well-developed, well-groomed and normal weight. She is not  ill-appearing, toxic-appearing or diaphoretic.  HENT:     Head: Normocephalic and atraumatic.     Jaw: There is normal jaw occlusion.     Salivary Glands: Right salivary gland is not diffusely enlarged. Left salivary gland is not diffusely enlarged.     Right Ear: Hearing and external ear normal. No decreased hearing noted. No laceration, drainage, swelling or tenderness.     Left Ear: Hearing and external ear normal. No decreased hearing noted. No laceration, drainage, swelling or tenderness.     Nose: Nose normal. No congestion or rhinorrhea.     Right Nostril: No epistaxis.     Left Nostril: No epistaxis.     Mouth/Throat:     Lips: Pink. No lesions.     Mouth: Mucous membranes are moist. No oral lesions or angioedema.     Dentition: No gum lesions.     Tongue: No lesions.     Pharynx: Oropharynx is clear. Uvula midline.  Eyes:     General: Lids are normal. Vision grossly intact. Gaze aligned appropriately. No scleral icterus.       Right eye: No discharge.        Left eye: No discharge.     Extraocular Movements: Extraocular movements intact.     Right eye: Normal extraocular motion and no nystagmus.     Left eye: Normal extraocular motion and no nystagmus.     Conjunctiva/sclera: Conjunctivae normal.     Right eye: Right conjunctiva is not injected.     Left eye: Left conjunctiva is not injected.     Pupils: Pupils  are equal, round, and reactive to light.  Neck:     Trachea: Trachea and phonation normal.  Cardiovascular:     Rate and Rhythm: Normal rate and regular rhythm.     Pulses: Normal pulses.          Radial pulses are 2+ on the right side and 2+ on the left side.  Pulmonary:     Effort: Pulmonary effort is normal. No respiratory distress.     Breath sounds: Normal breath sounds and air entry. No stridor or transmitted upper airway sounds. No wheezing.     Comments: Spoke full sentences without difficulty; no cough observed in exam room Abdominal:     General:  Abdomen is flat.  Musculoskeletal:        General: Swelling, tenderness and signs of injury present. Normal range of motion.     Right elbow: No swelling, deformity, effusion or lacerations. Normal range of motion. No tenderness.     Left elbow: Swelling present. No deformity, effusion or lacerations. Normal range of motion. Tenderness present.     Right forearm: No swelling, edema, deformity, lacerations, tenderness or bony tenderness.     Left forearm: Swelling present. No edema, deformity, lacerations, tenderness or bony tenderness.     Right wrist: No swelling, deformity, effusion, lacerations, tenderness, bony tenderness or crepitus. Normal range of motion.     Left wrist: No swelling, deformity, effusion, lacerations, tenderness, bony tenderness or crepitus. Normal range of motion.     Right hand: No swelling, deformity, lacerations or tenderness. Normal range of motion. Normal strength. Normal sensation. Normal capillary refill.     Left hand: No swelling, deformity, lacerations or tenderness. Normal range of motion. Normal strength. Normal capillary refill.     Cervical back: Normal range of motion and neck supple. Spasms and tenderness present. No swelling, edema, deformity, erythema, signs of trauma, lacerations, rigidity, torticollis, bony tenderness or crepitus. Pain with movement present. Normal range of motion.     Thoracic back: No swelling, edema, deformity, signs of trauma, lacerations, spasms or tenderness. Normal range of motion.     Lumbar back: No swelling, edema, deformity, signs of trauma, lacerations, spasms or tenderness. Normal range of motion.     Right knee: Erythema, ecchymosis and laceration present. No swelling, deformity, effusion, bony tenderness or crepitus. Normal range of motion. Tenderness present. Normal alignment and normal meniscus.     Left knee: No swelling, deformity, effusion, erythema, ecchymosis, lacerations, bony tenderness or crepitus. Normal range of  motion. No tenderness. Normal alignment and normal meniscus.     Right lower leg: No edema.     Left lower leg: No edema.     Comments: No crepitus on palpation or with AROM or palpable deformities right knee/left elbow  Lymphadenopathy:     Head:     Right side of head: No submandibular or preauricular adenopathy.     Left side of head: No submandibular or preauricular adenopathy.     Cervical:     Right cervical: No superficial cervical adenopathy.    Left cervical: No superficial cervical adenopathy.  Skin:    General: Skin is warm and dry.     Capillary Refill: Capillary refill takes less than 2 seconds.     Coloration: Skin is not ashen, cyanotic, jaundiced, mottled, pale or sallow.     Findings: Abrasion, bruising, ecchymosis, erythema, signs of injury and rash present. No abscess, acne, burn, laceration, lesion, petechiae or wound. Rash is macular. Rash is not crusting,  nodular, purpuric, pustular, scaling, urticarial or vesicular.         Comments: 2cm ecchymosis blue posterior elbow left and abrasion linear anterior right knee 1cm and 2 small nummular ecchymosis anterior right knee 1cm diameter each  Neurological:     General: No focal deficit present.     Mental Status: She is alert and oriented to person, place, and time. Mental status is at baseline.     GCS: GCS eye subscore is 4. GCS verbal subscore is 5. GCS motor subscore is 6.     Cranial Nerves: Cranial nerves 2-12 are intact. No cranial nerve deficit, dysarthria or facial asymmetry.     Sensory: Sensation is intact. No sensory deficit.     Motor: Motor function is intact. No weakness, tremor, atrophy, abnormal muscle tone or seizure activity.     Coordination: Coordination is intact. Coordination normal.     Gait: Gait is intact. Gait normal.     Comments: In/out of chair and on/off exam table without difficulty; gait sure and steady in clinic; bilateral hand grasp equal 5/5  Psychiatric:        Attention and  Perception: Attention and perception normal.        Mood and Affect: Mood and affect normal.        Speech: Speech normal.        Behavior: Behavior normal. Behavior is cooperative.        Thought Content: Thought content normal.        Cognition and Memory: Cognition and memory normal.        Judgment: Judgment normal.      No results found for any visits on 01/18/24.    The 10-year ASCVD risk score (Arnett DK, et al., 2019) is: 5.4%    Assessment & Plan:   Problem List Items Addressed This Visit   None Visit Diagnoses       Muscle spasm    -  Primary     Fall from slip, trip, or stumble, initial encounter         Contusion of right knee, initial encounter         Contusion, elbow, with forearm, left, initial encounter          Patient has tylenol  and ibuprofen  at home.  cyclobenazeprine/flexeril  10mg  sig t1/2-1 po TID prn muscle spasms #30 RF0 dispensed from [facility].  Ibuprofen  800mg  po TID prn pain or tylenol  1000mg  po q6h prn pain   Avoid alcohol intake and driving while taking cyclobenazeprine/flexeril  as drowsiness common side effect.  Slow position changes as medication also lower blood pressure.  Home stretches demonstrated to patient-e.g. Arm circles, walking up wall, chest stretches, neck AROM, chin tucks, knee to chest and rock side to side on back. Self massage or professional prn, foam roller use or tennis/racquetball. Discussed day after fall muscles usually more sore day 2-3 than day 1. Heat/cryotherapy 15 minutes QID prn.  Given 1 chemical ice pack for use prior to returning to workcenter today.  Did not want to go to [facility].  Incident report group notified treated in clinic today e.g. HR/Safety officer.  Patient notified clinic closed tomorrow and to contact [employer] HR if unable to perform duties/needs [facility] appt as [facility] NP unable to write work restrictions due to contract limitations.  Consider physical therapy referral if no improvement with prescribed therapy   Exitcare  handouts printed and given to patient.  Ensure ergonomics correct desk at work avoid repetitive motions if possible/holding  phone/laptop in hand use desk/stand and/or break up lifting items into smaller loads/weights.  Patient was instructed to rest, ice, and ROM exercises.  Activity as tolerated.   Follow up if symptoms persist or worsen especially if loss of bowel/bladder control, arm/leg weakness and/or saddle paresthesias.  Exitcare handouts on muscle spasms and rehab exercises given to patient.  Patient verbalized agreement and understanding of treatment plan and had no further questions at this time.  P2:  Injury Prevention and Fitness.   Patient was instructed to rest extremities. Do not soak arm/knee until abrasions healed avoid pool, lake, hot tub, dirty sink water. May shower apply neosporin/triple antibiotic BID keep wounds covered they will heal faster and prevent contamination rubbing from clothing tearing off scabs.  wash with soap and water daily and prn soiling.  Change prn soiling. Exitcare handouts on abrasion/contusion/knee pain given to patient. Medications as directed. May apply ice 15 minutes QID prn pain/swelling.  Elevate leg/elbow on pillow if swelling when seated  Call or return to clinic as needed if these symptoms worsen or fail to improve as anticipated especially if cold/blue or red streaks/purulent discharge from wound. Patient verbalized agreement and understanding of treatment plan. P2: ROM, injury prevention   Return if symptoms worsen or fail to improve.    Ellouise DELENA Hope, NP

## 2024-01-18 NOTE — Patient Instructions (Addendum)
 Acute Knee Pain, Adult Many things can cause knee pain. Sometimes, knee pain is sudden (acute). It may be caused by damage, swelling, or irritation of the muscles and tissues that support your knee. Pain may come from: A fall. An injury to the knee from twisting motions. A hit to the knee. Infection. The pain often goes away on its own with time and rest. If the pain does not go away, tests may be done to find out what is causing the pain. These may include: Imaging tests, such as an X-ray, MRI, CT scan, or ultrasound. Joint aspiration. In this test, fluid is removed from the knee and checked. Arthroscopy. In this test, a lighted tube is put in the knee and an image is shown on a screen. A biopsy. In this test, a health care provider will remove a small piece of tissue for testing. Follow these instructions at home: If you have a knee sleeve or brace that can be taken off:  Wear the knee sleeve or brace as told by your provider. Take it off only if your provider says that you can. Check the skin around it every day. Tell your provider if you see problems. Loosen the knee sleeve or brace if your toes tingle, are numb, or turn cold and blue. Keep the knee sleeve or brace clean and dry. Bathing If the knee sleeve or brace is not waterproof: Do not let it get wet. Cover it when you take a bath or shower. Use a cover that does not let any water in. Managing pain, stiffness, and swelling  If told, put ice on the area. If you have a knee sleeve or brace that you can take off, remove it as told. Put ice in a plastic bag. Place a towel between your skin and the bag. Leave the ice on for 20 minutes, 2-3 times a day. If your skin turns bright red, take off the ice right away to prevent skin damage. The risk of damage is higher if you cannot feel pain, heat, or cold. Move your toes often to reduce stiffness and swelling. Raise the injured area above the level of your heart while you are sitting  or lying down. Use a pillow to support your foot as needed. If told, use an elastic bandage to put pressure (compression) on your injured knee. This may control swelling, give support, and help with discomfort. Sleep with a pillow under your knee. Activity Rest your knee. Do not do things that cause pain or make pain worse. Do not stand or walk on your injured knee until you're told it's okay. Use crutches as told. Avoid activities where both feet leave the ground at the same time and put stress on the joints. Avoid running, jumping rope, and doing jumping jacks. Work with a physical therapist to make a safe exercise program if told. Physical therapy helps your knee move better and get stronger. Exercise as told. General instructions Take your medicines only as told by your provider. If you are overweight, work with your provider and an expert in healthy eating, called a dietician, to set goals to lose weight. Being overweight can make your knee hurt more. Do not smoke, vape, or use products with nicotine or tobacco in them. If you need help quitting, talk with your provider. Return to normal activities when you are told. Ask what things are safe for you to do. Watch for any changes in your symptoms. Keep all follow-up visits. Your provider will check  your healing and adjust treatments if needed. Contact a health care provider if: The knee pain does not stop. The knee pain changes or gets worse. You have a fever along with knee pain. Your knee is red or feels warm when you touch it. Your knee gives out or locks up. Get help right away if: Your knee swells and the swelling gets worse. You cannot move your knee. You have very bad knee pain that does not get better with medicine. This information is not intended to replace advice given to you by your health care provider. Make sure you discuss any questions you have with your health care provider. Document Revised: 02/25/2023 Document  Reviewed: 07/20/2022 Elsevier Patient Education  2024 [employer].  Muscle Cramps and Spasms Muscle cramps and spasms occur when a muscle or muscles tighten and you have no control over this tightening (involuntary muscle contraction). They are a common problem that can happen in any muscle. The most common place is in the calf muscles of the leg. There are a few ways that muscle cramps and spasms differ: Muscle cramps are painful. They come and go and may last for a few seconds or up to 15 minutes. Muscle cramps are often more forceful and last longer than muscle spasms. Muscle spasms may or may not be painful. They may last just a few seconds or last much longer. Certain conditions, such as diabetes or Parkinson's disease, can make you more likely to have cramps or spasms. But in most cases, cramps and spasms are not caused by other conditions. Common causes include: Overexertion. This is when you do more physical work or exercise than your body is ready for. Overuse from doing the same movements too many times. Staying in one position for too long. Improper preparation, form, or technique when playing a sport or doing an activity. Not enough water or other fluids in your body (dehydration). Other causes may include: Injury. Side effects of some medicines. Too few salts and minerals in your body (electrolytes), such as potassium and calcium. This could happen if you are taking water pills (diuretics) or if you are pregnant. In many cases, the cause of muscle cramps or spasms is not known. Follow these instructions at home: Eating and drinking Drink enough fluid to keep your pee (urine) pale yellow. This can help prevent cramps or spasms. Eat a healthy diet that includes a lot of nutrients to help your muscles work. A healthy diet includes fruits and vegetables, lean protein, whole grains, and low-fat or nonfat dairy products. Managing pain and stiffness     Try to massage, stretch, and  relax the affected muscle. Do this for a few minutes at a time. If told, put ice on the muscles. This may help if you are sore or have pain after a cramp or spasm. Put ice in a plastic bag. Place a towel between your skin and the bag. Leave the ice on for 20 minutes, 2-3 times a day. If told, apply heat to tight or tense muscles as often as told by your health care provider. Use the heat source that your provider recommends, such as a moist heat pack or a heating pad. Place a towel between your skin and the heat source. Leave the heat on for 20-30 minutes. If your skin turns bright red, remove the ice or heat right away to prevent skin damage. The risk of damage is higher if you cannot feel pain, heat, or cold. Take hot showers or  baths to help relax tight muscles. General instructions If you are having cramps often, avoid intense exercise for a few days. Take over-the-counter and prescription medicines only as told by your provider. Watch for any changes in your symptoms. Contact a health care provider if: Your cramps or spasms get more severe or happen more often. Your cramps or spasms do not get better over time. This information is not intended to replace advice given to you by your health care provider. Make sure you discuss any questions you have with your health care provider. Document Revised: 01/13/2022 Document Reviewed: 01/13/2022 Elsevier Patient Education  2024 Else[employer] Inc.  Contusion A contusion is a deep bruise. Contusions are the result of a blunt injury to tissues and muscle fibers under the skin. The injury causes bleeding under the skin. The skin over the contusion may turn blue, purple, or yellow. Minor injuries will give you a painless contusion, but more severe injuries cause contusions that can stay painful and swollen for a few weeks. Follow these instructions at home: Pay attention to any changes in your symptoms. Let your health care provider know about them. Take  these actions to relieve your pain. Managing pain, stiffness, and swelling  Use resting, icing, applying pressure (compression), and raising (elevating) the injured area. This is often called the RICE method. Rest the injured area. Return to your normal activities as told by your health care provider. Ask your health care provider what activities are safe for you. If directed, put ice on the injured area. To do this: Put ice in a plastic bag. Place a towel between your skin and the bag. Leave the ice on for 20 minutes, 2-3 times a day. If your skin turns bright red, remove the ice right away to prevent skin damage. The risk of skin damage is higher if you cannot feel pain, heat, or cold. If directed, apply light compression to the injured area using an elastic bandage. Make sure the bandage is not wrapped too tightly. Remove and reapply the bandage as directed by your health care provider. If possible, elevate the injured area above the level of your heart while you are sitting or lying down. General instructions Take over-the-counter and prescription medicines only as told by your health care provider. Keep all follow-up visits. Your health care provider may want to see how your contusion is healing with treatment. Contact a health care provider if: Your symptoms do not improve after several days of treatment. Your symptoms get worse. You have difficulty moving the injured area. Get help right away if: You have severe pain. You have numbness in a hand or foot. Your hand or foot turns pale or cold. This information is not intended to replace advice given to you by your health care provider. Make sure you discuss any questions you have with your health care provider. Document Revised: 11/10/2021 Document Reviewed: 11/10/2021 Elsevier Patient Education  2024 [employer] Inc.  Cervical Strain and Sprain Rehab Ask your health care provider which exercises are safe for you. Do exercises exactly  as told by your health care provider and adjust them as directed. It is normal to feel mild stretching, pulling, tightness, or discomfort as you do these exercises. Stop right away if you feel sudden pain or your pain gets worse. Do not begin these exercises until told by your health care provider. Stretching and range-of-motion exercises Cervical side bending  Using good posture, sit on a stable chair or stand up. Without moving your shoulders,  slowly tilt your left / right ear to your shoulder until you feel a stretch in the neck muscles on the opposite side. You should be looking straight ahead. Hold for ___30_______ seconds. Repeat with the other side of your neck. Repeat ____3______ times. Complete this exercise ___2_______ times a day. Cervical rotation  Using good posture, sit on a stable chair or stand up. Slowly turn your head to the side as if you are looking over your left / right shoulder. Keep your eyes level with the ground. Stop when you feel a stretch along the side and the back of your neck. Hold for _____30_____ seconds. Repeat this by turning to your other side. Repeat ____3______ times. Complete this exercise ______2____ times a day. Thoracic extension and pectoral stretch  Roll a towel or a small blanket so it is about 4 inches (10 cm) in diameter. Lie down on your back on a firm surface. Put the towel in the middle of your back across your spine. It should not be under your shoulder blades. Put your hands behind your head and let your elbows fall out to your sides. Hold for ______30____ seconds. Repeat 3 times. Complete this exercise ____2______ times a day. Strengthening exercises Upper cervical flexion  Lie on your back with a thin pillow behind your head or a small, rolled-up towel under your neck. Gently tuck your chin toward your chest and nod your head down to look toward your feet. Do not lift your head off the pillow. Hold for _____30_____  seconds. Release the tension slowly. Relax your neck muscles completely before you repeat this exercise. Repeat ______3____ times. Complete this exercise ____2______ times a day. Cervical extension  Stand about 6 inches (15 cm) away from a wall, with your back facing the wall. Place a soft object, about 6-8 inches (15-20 cm) in diameter, between the back of your head and the wall. A soft object could be a small pillow, a ball, or a folded towel. Gently tilt your head back and press into the soft object. Keep your jaw and forehead relaxed. Hold for ____30______ seconds. Release the tension slowly. Relax your neck muscles completely before you repeat this exercise. Repeat _____3_____ times. Complete this exercise ____2______ times a day. Posture and body mechanics Body mechanics refer to the movements and positions of your body while you do your daily activities. Posture is part of body mechanics. Good posture and healthy body mechanics can help to relieve stress in your body's tissues and joints. Good posture means that your spine is in its natural S-curve position (your spine is neutral), your shoulders are pulled back slightly, and your head is not tipped forward. The following are general guidelines for using improved posture and body mechanics in your everyday activities. Sitting  When sitting, keep your spine neutral and keep your feet flat on the floor. Use a footrest, if needed, and keep your thighs parallel to the floor. Avoid rounding your shoulders. Avoid tilting your head forward. When working at a desk or a computer, keep your desk at a height where your hands are slightly lower than your elbows. Slide your chair under your desk so you are close enough to maintain good posture. When working at a computer, place your monitor at a height where you are looking straight ahead and you do not have to tilt your head forward or downward to look at the screen. Standing  When standing, keep  your spine neutral and keep your feet about hip-width apart.  Keep a slight bend in your knees. Your ears, shoulders, and hips should line up. When you do a task in which you stand in one place for a long time, place one foot up on a stable object that is 2-4 inches (5-10 cm) high, such as a footstool. This helps keep your spine neutral. Resting When lying down and resting, avoid positions that are most painful for you. Try to support your neck in a neutral position. You can use a contour pillow or a small rolled-up towel. Your pillow should support your neck but not push on it. This information is not intended to replace advice given to you by your health care provider. Make sure you discuss any questions you have with your health care provider. Document Revised: 09/28/2022 Document Reviewed: 12/15/2021 Elsevier Patient Education  2024 [employer].

## 2024-03-02 ENCOUNTER — Telehealth: Payer: Self-pay | Admitting: Registered Nurse

## 2024-03-02 ENCOUNTER — Encounter: Payer: Self-pay | Admitting: Registered Nurse

## 2024-03-02 DIAGNOSIS — A084 Viral intestinal infection, unspecified: Secondary | ICD-10-CM

## 2024-03-02 DIAGNOSIS — U071 COVID-19: Secondary | ICD-10-CM

## 2024-03-02 DIAGNOSIS — J069 Acute upper respiratory infection, unspecified: Secondary | ICD-10-CM

## 2024-03-02 NOTE — Telephone Encounter (Signed)
 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   close contacts in previous 48hours at work lunch buddies were notified by patient already to home covid test  Pt began quarantine today after PCM appt. Patient did not develop symptoms of  trouble breathing,  nausea, vomiting, diarrhea, fever or chills.   quarantine per [employer] recommendations. Day 1 of quarantine was today. Patient to contact clinic staff if vomiting after coughing or unable to tolerate po fluids.  Discussed flu and other viral illnesses circulating in community and some causing GI upset.  If GI upset I have recommended clear fluids then bland diet.  Avoid dairy/spicy, fried and large portions of meat while having nausea.  If vomiting hold po intake x 1 hour.  Then sips clear fluids like broths, ginger ale, power ade, gatorade, pedialyte may advance to soft/bland if no vomiting x 24 hours and appetite returned otherwise hydration main focus. Call me at work from home number if symptoms not improved with plan of care  patient to call if high fever, dehydration, marked weakness, fainting, increased abdominal pain, blood in stool or vomit (red or black).     Reviewed possible Covid symptoms including cough, shortness of breath with exertion or at rest, runny nose, congestion, sinus pain/pressure, sore throat, fever/chills, body aches, fatigue, loss of taste/smell, GI symptoms of nausea/vomiting/diarrhea. Also reviewed same day/emergent eval/ER precautions of dizziness/syncope, confusion, blue tint to lips/face, severe shortness of breath/difficulty breathing/wheezing.    Patient lives alone  Patient to isolate in own room and if possible use only one bathroom if living with  others in home.  Wear mask when out of room to help prevent spread to others in household.  Sanitize high touch surfaces with lysol/chlorox/bleach spray or wipes daily as viruses are known to live on surfaces from 24 hours to days.  Patient does not want antivirals.  Patient at higher risk for hospitalization due to cancer history, prediabetes, depression  Patient is not up to date on covid vaccines.  Recommend annual booster or to start series 60 days after infection resolution.  Patient is  on prescription medications or daily medications. If taking medications interaction checker used to verify if any drug interactions. Patient asked regarding buproprion and prednisone .  Discussed it can raise buproprion levels but she is on medium not high dose of prednisone /buproprion but she may notice difference in her mood from covid infection; prednisone  sometimes causing insomnia and dose increasing because of interaction.  Only taking OTC cough/cold/fever medication at this time dayquil/nyquil/honey.  Discussed I recommended not having sex with anyone while sick/testing positive/10 day quarantine as could spread virus to partner.  Discussed with patient I  would call again this weekend to follow up symptoms/see if questions/concerns.  Exitcare handouts on covid quarantine/home care sent to patient my chart    Patient has dayquil/nyquil at home for cough/cold use and is working for her/him.  May use salt water gargles and nasal saline 2 sprays each nostril q2h prn congestion/sore throat.  Research has shown it helps to prevent hospitalizations and decrease discomfort.   May use flonase  nasal 50mcg 1 spray each nostril BID prn rhinitis.  Dayquil and nyquil per manufacturer instructions. honey 1  tablespoon every 4 hours is a natural cough suppressant but caution due to his diabetes.  Avoid dehydration and drink water to keep urine pale yellow clear and voiding every 2-4 hours while awake.  Patient alert and oriented x3,  spoke full sentences without difficulty.  No nasal congestion/cough/throat clearing/hoarse voice/wheezing/shortness of breath during 10 minute telephone call.  Discussed with patient can contact NP Ellouise through my [phone] when clinic closed if questions or concerns until RN Pati returns to clinic on [facility] [phone].   Pt verbalized understanding and agreement with plan of care. No further questions/concerns at this time. Pt reminded to contact clinic with any changes in symptoms or questions/concerns. At this time estimated RTW 9/28 with strict mask wear through Day 10  11 Mar 2024 and no eating in employee lunch room.   Supervisor notified of excused absence 9/25-9/28.

## 2024-03-07 NOTE — Telephone Encounter (Signed)
 Patient reported diarrhea started today at work. Brown watery denied fever/chills/blood in stool.  Discussed not uncommon with diarrhea but also GI bug has been circulating in community also symptoms typically 24-48 hours. Dinner last night soup.  Today has not had any spicy/friend food.  Tolerating po intake without difficulty;  SP02 intermittent monitoring 98% BBS CTA abdomen soft nontender skin warm dry and pink tympanic temp 97.28F HR 82  Keeping diet bland and hydrating. I have recommended clear fluids and advanced to soft as tolerated.  Avoid dairy, spicy and fried foods until diarrhea resolves.  Avoid high sugar added foods e.g. juices/sodas/candy.   Avoid dehydration drink noncaffeinated beverages (water, ginger ale, soup broth, popsicles, no sugar added gatorade/powerade) to urinate every 2-4 hours pale yellow urine.  It is easy to become dehydrated when having diarrhea along with electrolyte imbalances.   Patient given exitcare handouts on diarrhea and foods to relieve diarrhea. Medications as directed.  Return to the clinic if symptoms persist or worsen; I have alerted the patient to call if high fever, dehydration, marked weakness, fainting, increased abdominal pain, blood in stool or vomit. Patient given work excuse.. Patient verbalized agreement and understanding of treatment plan and had no further questions at this time. P2: Hand washing and fitness

## 2024-03-16 NOTE — Telephone Encounter (Signed)
 Patient seen in lunch room reported GI symptoms had resolved feeling better denied concerns.  A&Ox3 respirations even and unlabored RA skin warm dry and pink gait sure and steady.  No cough/throat clearing/congestion observed/audible.

## 2024-03-21 ENCOUNTER — Encounter: Payer: Self-pay | Admitting: General Practice

## 2024-03-21 ENCOUNTER — Ambulatory Visit: Admitting: Registered Nurse

## 2024-03-21 DIAGNOSIS — Z23 Encounter for immunization: Secondary | ICD-10-CM

## 2024-03-21 NOTE — Progress Notes (Signed)
 63y/o caucasian female here for flu vaccination.  Requested high dose flu.  Administered see immunizations sheet.  Paper questionnaire for vaccinations completed and on file at [facility]. Site clean dry and bandaid intact on ambulatory discharge from clinic.  Respirations even and unlabored skin warm dry and pink A&Ox3 afebrile and feeling well today.  Next booster due in 1 year.  Discussed covid booster recommended in 60 days as had covid test positive 03/02/24   Printed VIS influenza given to patient from [facility] website. Patient verbalized understanding information/instructions, agreed with plan of care and had no further questions at this time.

## 2024-03-21 NOTE — Patient Instructions (Signed)
 Printed influenza VIS given to patient

## 2024-03-24 ENCOUNTER — Telehealth: Payer: Self-pay | Admitting: Registered Nurse

## 2024-03-24 DIAGNOSIS — R7303 Prediabetes: Secondary | ICD-10-CM

## 2024-04-12 ENCOUNTER — Ambulatory Visit: Admitting: General Practice

## 2024-04-12 ENCOUNTER — Other Ambulatory Visit: Payer: Self-pay | Admitting: General Practice

## 2024-04-12 ENCOUNTER — Other Ambulatory Visit: Payer: Self-pay | Admitting: Registered Nurse

## 2024-04-12 DIAGNOSIS — Z0189 Encounter for other specified special examinations: Secondary | ICD-10-CM

## 2024-04-12 DIAGNOSIS — R7303 Prediabetes: Secondary | ICD-10-CM

## 2024-04-12 NOTE — Progress Notes (Signed)
 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 CMP and Hgb A1C drawn from Rt arm as Lt arm is restricted. Will send specimens to [facility] this afternoon.

## 2024-04-13 ENCOUNTER — Ambulatory Visit: Payer: Self-pay | Admitting: Registered Nurse

## 2024-04-13 DIAGNOSIS — R7303 Prediabetes: Secondary | ICD-10-CM

## 2024-04-13 LAB — COMPREHENSIVE METABOLIC PANEL WITH GFR
ALT: 19 IU/L (ref 0–32)
AST: 24 IU/L (ref 0–40)
Albumin: 4.4 g/dL (ref 3.9–4.9)
Alkaline Phosphatase: 106 IU/L (ref 49–135)
BUN/Creatinine Ratio: 27 (ref 12–28)
BUN: 17 mg/dL (ref 8–27)
Bilirubin Total: 0.4 mg/dL (ref 0.0–1.2)
CO2: 21 mmol/L (ref 20–29)
Calcium: 10.1 mg/dL (ref 8.7–10.3)
Chloride: 100 mmol/L (ref 96–106)
Creatinine, Ser: 0.62 mg/dL (ref 0.57–1.00)
Globulin, Total: 2.1 g/dL (ref 1.5–4.5)
Glucose: 84 mg/dL (ref 70–99)
Potassium: 4.2 mmol/L (ref 3.5–5.2)
Sodium: 135 mmol/L (ref 134–144)
Total Protein: 6.5 g/dL (ref 6.0–8.5)
eGFR: 99 mL/min/1.73 (ref >59)

## 2024-04-13 LAB — HGB A1C W/O EAG: Hgb A1c MFr Bld: 5.8 % — ABNORMAL HIGH (ref 4.8–5.6)

## 2024-04-14 NOTE — Telephone Encounter (Signed)
 See results note calcium normal and Hgba1c remains mildly elevated  Per epic patient reviewed results note 04/13/24

## 2024-05-10 ENCOUNTER — Ambulatory Visit: Payer: Self-pay | Admitting: Registered Nurse

## 2024-05-10 ENCOUNTER — Ambulatory Visit: Admitting: General Practice

## 2024-05-10 ENCOUNTER — Telehealth: Payer: Self-pay | Admitting: Registered Nurse

## 2024-05-10 ENCOUNTER — Encounter: Payer: Self-pay | Admitting: Registered Nurse

## 2024-05-10 VITALS — BP 138/64 | HR 102 | Temp 101.3°F | Resp 18

## 2024-05-10 DIAGNOSIS — R058 Other specified cough: Secondary | ICD-10-CM

## 2024-05-10 DIAGNOSIS — J069 Acute upper respiratory infection, unspecified: Secondary | ICD-10-CM

## 2024-05-10 DIAGNOSIS — J9801 Acute bronchospasm: Secondary | ICD-10-CM

## 2024-05-10 DIAGNOSIS — J111 Influenza due to unidentified influenza virus with other respiratory manifestations: Secondary | ICD-10-CM

## 2024-05-10 LAB — POC COVID19 BINAXNOW: SARS Coronavirus 2 Ag: NEGATIVE

## 2024-05-10 MED ORDER — OSELTAMIVIR PHOSPHATE 75 MG PO CAPS: 75.0000 mg | ORAL_CAPSULE | Freq: Two times a day (BID) | ORAL | 0 refills | Status: AC

## 2024-05-10 NOTE — Telephone Encounter (Signed)
 Telephone message left for patient to contact me if further questions or concerns tamiflu  Rx 75mg  po BID x 5 days #10 RF0 sent to her pharmacy of choice.  Exitcare handout on influenza to my chart and viral illness.  Patient seen by OBIE Redhead in clinic today NP clinic tomorrow.  Avoid dehydration may need up to an extra liter of fluid per day with mucous/mouth breathing.  Monitor color of urine.  bland diet avoid spicy/fried/large portions meat/dairy if having nausea/sour stomach.  symptomatic treatment with nasal saline, Flonase , and over-the-counter cold remedies as needed.  administer saline first then flonase  nasal spray  salt water gargles for sore throat relief.  maintain hydration and rest.  Honey 1 tablespoon po q4h prn cough. Caution due to prediabetes.  Tylenol  1000mg  po q6h prn pain.  avoid intimate oral contact with others while symptomatic or having sex as could spread viral illness. OTC dayquil/nyquil/robitussin/delsym per manufacturer instructions   Need to dry up post nasal drip and will help with cough/sore throat also.  Many viruses circulating in community at this time adenovirus, rsv, covid, flu, norovirus and metapneumovirus.  Notify me if she develops fever greater than 1050F or positive home covid test later this week as instructed to retest in 48 hours if no improvement in symptoms or new/worsening symptoms.  hydrate with water to keep urine pale yellow clear and voiding every 2-4 hours while awake.  Patient may use normal saline nasal spray 2 sprays each nostril q2h wa as needed. Flonase  nasal 50mcg 1 spray each nostril BID OTC.  Shower BID prn congestion  Notify me via my chart if positive home covid test, new fever, white spots in back of throat, mucous thick/chunky/brown/foul tasting opaque as may need antibiotic if that occurs.  Normal viral mucous is clear to yellow to green than done.  Call or return to clinic as needed if these symptoms worsen or fail to improve as anticipated.    Exitcare handouts viral URI with cough, influenza and sinus rinse sent to patient my chart.  Spoke with RN Redhead via telephone after patient had left clinic.  Patient reported to RN Geronda sore throat started Sunday 05/07/24  Fever this morning 100.50F verified in clinic and elevated further on recheck see RN Redhead note in Epic  A&Ox3 in clinic ambulatory without respiratory distress  RN Redhead notified supervisor and HR excused absence for communicable disease today and tomorrow.  Re-evaluation via telephone tomorrow by NP planned.  P2:  Avoidance and hand washing.

## 2024-05-10 NOTE — Progress Notes (Unsigned)
 Employee presents to the clinic this am wearing a mask, c/o feeling awful with cough & yellow phlegm, sneezing, sore throat and achy all over. Sore throat started 3 days ago on 05/07/24 (Sunday). She is unsure of sick contacts within the past week/holiday. Has not lost taste or smell, has only drank a V8 juice this am. Stated  I took my temperature at home this am and it was 101; I wanted to make sure my thermometer was right. Reports taking B/P med and Mucinex this am.   RN: Hypertensive with a low grade fever, POC COVID test completed with a negative result. After 15 minutes, VS retaken. B/P taken manually and more febrile. Encouraged her to hydrate well with water, lower sugar juices, broth/soups and not tea, as it may dehydrate. Also discussed taking Tylenol  OTC and alternating with Ibuprofen  OTC every 6 hours for aching and until fever breaks. Employee provided with an excused work absence for at least 2 days, s/s indicative of possible flu. Will confer with the clinic NP related to s/s and follow up with the employee tomorrow.

## 2024-05-11 MED ORDER — ALBUTEROL SULFATE HFA 108 (90 BASE) MCG/ACT IN AERS: 1.0000 | INHALATION_SPRAY | RESPIRATORY_TRACT | 0 refills | Status: AC | PRN

## 2024-05-11 NOTE — Telephone Encounter (Signed)
 Patient stated Tmax today 100.84F before tyleol and having lots of rhinitis/coughing asking if she should use primatene  mist.  Extended work excuse through Sunday 7 Dec and will contact patient via telephone again 7 Dec for re-evaluation for RTW 8 Dec.  Patient may continue OTC cough and cold medicine and tamiflu  75mg  po bid. Electronic Rx for albuterol  inhaler 1-2 puffs po q4-6h prn protracted cough/wheezing/chest tightness #1 RF0  Discussed how to use inhaler with patient. Patient stated hydrating.  Does not have much of appetite discussed to try and eat snack on regular basis as body needs calories to fight virus.  A&Ox3 spoke full sentences without difficulty throat clearing during 5 minute call but not cough or wheeze audible.  Patient reported very fatigued.  Refused rx for tessalon pearles or prescription cough medication at this time.  Patient notified again to send my chart message if tan/brown sputum, worsening fever, vomiting after coughing or new symptoms.  Exitcare handout on albuterol  inhaler to my chart.  Patient verbalized understanding information/instructions, agreed with plan of care and had no further questions at this time.

## 2024-05-14 NOTE — Telephone Encounter (Signed)
 Patient returned call stated on last day of tamiflu  feeling better.  Was able to go to church, cook, do house chores today.  Afebrile denied new symptoms.  Using albuterol  inhaler 2 puffs prn typically at least 2 times per day.  Discussed may need inhaler at work and to bring with her tomorrow.  Discussed changes in temperature, dust may trigger cough/bronchospasms.  Patient is still feeling fatigued but wants to go to work tomorrow.  HR team notified cleared to return onsite tomorrow.  Discussed RN Karene onsite tomorrow and patient may see her if not feeling well when onsite tomorrow 8-5.  Patient may also send me my chart message or contact me on work from home number.  Patient A&Ox3 spoke full sentences without difficulty.  She is starting to wonder if she needs to retire as never fully recovered after covid earlier this year and now the flu fatigue again.  Will re-evaluate patient on Tuesday 12/9 when onsite e.g. sp02, breath sounds.  Patient agreed with plan of care and had no further questions at this time.

## 2024-05-16 ENCOUNTER — Ambulatory Visit: Admitting: Registered Nurse

## 2024-05-16 ENCOUNTER — Encounter: Payer: Self-pay | Admitting: Registered Nurse

## 2024-05-16 VITALS — HR 70 | Temp 99.1°F | Resp 16

## 2024-05-16 DIAGNOSIS — J111 Influenza due to unidentified influenza virus with other respiratory manifestations: Secondary | ICD-10-CM

## 2024-05-16 NOTE — Patient Instructions (Signed)

## 2024-05-16 NOTE — Telephone Encounter (Signed)
 Patient see in clinic today see office note sp02 99% BBS CTA worked yesterday and today without difficulty

## 2024-05-16 NOTE — Progress Notes (Signed)
 Established Patient Office Visit  Subjective   Patient ID: Amy Holland, female    DOB: January 06, 1961  Age: 63 y.o. MRN: [id number]  Chief Complaint  Patient presents with  . follow up RTW after flu    63y/o single caucasian female established patient here follow up after missed some work days last week due to flu.  Tamiflu  helped the most.  Yesterday was okay along with today.  Denied dyspnea/wheezing/fever/chills.  Still some fatigue tolerating po without difficulty.  Wearing mask at work.  Mild cough remains but hasn't needed inhaler.  Accidentally requested albuterol  refill on pharmacy app please disregard request.  Sleeping well  [facility] covid test was negative 05/10/24  Rapid flu testing not available at [facility].      Review of Systems  Constitutional:  Positive for malaise/fatigue. Negative for chills and fever.  HENT:  Negative for congestion, ear discharge, ear pain, nosebleeds, sinus pain and sore throat.   Eyes:  Negative for blurred vision, double vision, photophobia, pain, discharge and redness.  Respiratory:  Positive for sputum production. Negative for cough, hemoptysis, shortness of breath, wheezing and stridor.   Cardiovascular:  Negative for chest pain.  Gastrointestinal:  Negative for diarrhea and vomiting.  Musculoskeletal:  Negative for back pain, myalgias and neck pain.  Skin:  Negative for itching and rash.  Neurological:  Negative for dizziness, tingling, tremors, speech change, weakness and headaches.  Endo/Heme/Allergies:  Positive for environmental allergies.  Psychiatric/Behavioral:  The patient does not have insomnia.       Objective:     There were no vitals taken for this visit.   Physical Exam Vitals reviewed.  Constitutional:      General: She is awake. She is not in acute distress.    Appearance: Normal appearance. She is well-developed, well-groomed and normal weight. She is not ill-appearing, toxic-appearing or diaphoretic.  HENT:      Head: Normocephalic and atraumatic. No right periorbital erythema or left periorbital erythema.     Jaw: There is normal jaw occlusion.     Salivary Glands: Right salivary gland is not diffusely enlarged or tender. Left salivary gland is not diffusely enlarged or tender.     Right Ear: Hearing and external ear normal. No decreased hearing noted. No laceration, drainage or swelling.     Left Ear: Hearing and external ear normal. No decreased hearing noted. No laceration, drainage or swelling.     Nose: Mucosal edema present. No signs of injury, laceration, nasal tenderness, congestion or rhinorrhea.     Right Nostril: No epistaxis.     Left Nostril: No epistaxis.  Eyes:     General: Lids are normal. Vision grossly intact. Gaze aligned appropriately. Allergic shiner present. No scleral icterus.       Right eye: No discharge.        Left eye: No discharge.     Extraocular Movements: Extraocular movements intact.     Conjunctiva/sclera: Conjunctivae normal.     Pupils: Pupils are equal, round, and reactive to light.  Neck:     Trachea: Trachea and phonation normal.  Cardiovascular:     Rate and Rhythm: Normal rate and regular rhythm.     Pulses: Normal pulses.     Heart sounds: Normal heart sounds, S1 normal and S2 normal.  Pulmonary:     Effort: Pulmonary effort is normal. No respiratory distress.     Breath sounds: Normal breath sounds and air entry. No stridor or transmitted upper airway sounds. No decreased  breath sounds, wheezing, rhonchi or rales.     Comments: Spoke full sentences without difficulty; no cough observed in exam room; BBS CTA Abdominal:     General: Abdomen is flat.  Musculoskeletal:        General: Normal range of motion.     Right hand: Normal strength. Normal capillary refill.     Left hand: Normal strength. Normal capillary refill.     Cervical back: Normal range of motion and neck supple. No swelling, edema, deformity, erythema, signs of trauma, lacerations,  rigidity, spasms, torticollis, tenderness or crepitus. No pain with movement. Normal range of motion.     Thoracic back: No swelling, edema, deformity, signs of trauma, lacerations, spasms or tenderness. Normal range of motion.     Right lower leg: No edema.     Left lower leg: No edema.  Lymphadenopathy:     Head:     Right side of head: No submandibular or preauricular adenopathy.     Left side of head: No submandibular or preauricular adenopathy.     Cervical: No cervical adenopathy.     Right cervical: No superficial cervical adenopathy.    Left cervical: No superficial cervical adenopathy.  Skin:    General: Skin is warm and dry.     Capillary Refill: Capillary refill takes less than 2 seconds.     Coloration: Skin is not ashen, cyanotic, jaundiced, mottled, pale or sallow.     Findings: No abrasion, abscess, acne, bruising, burn, ecchymosis, erythema, signs of injury, laceration, lesion, petechiae, rash or wound.     Comments: Anterior face/neck/hands visually inspected; patient wearing N95 face mask in clinic today  Neurological:     General: No focal deficit present.     Mental Status: She is alert and oriented to person, place, and time. Mental status is at baseline.     GCS: GCS eye subscore is 4. GCS verbal subscore is 5. GCS motor subscore is 6.     Cranial Nerves: No cranial nerve deficit, dysarthria or facial asymmetry.     Motor: Motor function is intact. No weakness, tremor, atrophy, abnormal muscle tone or seizure activity.     Coordination: Coordination is intact. Coordination normal.     Gait: Gait is intact. Gait normal.     Comments: gait sure and steady in clinic; bilateral hand grasp equal 5/5  Psychiatric:        Attention and Perception: Attention and perception normal.        Mood and Affect: Mood and affect normal.        Speech: Speech normal.        Behavior: Behavior normal. Behavior is cooperative.        Thought Content: Thought content normal.         Cognition and Memory: Cognition and memory normal.        Judgment: Judgment normal.      No results found for any visits on 05/16/24.    The 10-year ASCVD risk score (Arnett DK, et al., 2019) is: 7.6%    Assessment & Plan:   Problem List Items Addressed This Visit   None Visit Diagnoses       Influenza    -  Primary     Discussed sp02 good and BBS CTA today.  Discussed continue to hydrate with water to keep urine pale yellow clear and voiding every 2-4 hours while awake.  Wear mask at work if coughing and sneezing this week and frequent handwashing if  blowing nose/covering cough/touching face.  May need nap or extra sleep as still recovering from flu this week.  Continue plan of care as previously discussed e.g. nasal saline, flonase , antihistamine, otc cough/cold medication, albuterol  inhaler prn.  Follow up if fever returns, sputum opaque/tan/chunky/worsening cough or new symptoms.  Exitcare handout on influenza  Patient agreed with plan of care and had no further questions at this time.  Return if symptoms worsen or fail to improve.    Ellouise DELENA Hope, NP

## 2024-05-21 ENCOUNTER — Other Ambulatory Visit: Payer: Self-pay | Admitting: Registered Nurse

## 2024-05-21 DIAGNOSIS — R7303 Prediabetes: Secondary | ICD-10-CM

## 2024-05-21 DIAGNOSIS — Z0000 Encounter for general adult medical examination without abnormal findings: Secondary | ICD-10-CM

## 2024-06-05 ENCOUNTER — Other Ambulatory Visit: Payer: Self-pay | Admitting: Registered Nurse

## 2024-06-08 NOTE — Telephone Encounter (Signed)
 Patient reported she is not needing to use her inhaler at this time and does not need refill.  Pharmacy notified refill request refused and if requesting refill will need appt with NP
